# Patient Record
Sex: Female | Born: 1960 | Race: Black or African American | Hispanic: No | Marital: Single | Smoking: Never smoker
Health system: Southern US, Community
[De-identification: ages and names within clinical notes are randomized; demographics above are authoritative.]

## PROBLEM LIST (undated history)

## (undated) DIAGNOSIS — I7300 Raynaud's syndrome without gangrene: Secondary | ICD-10-CM

## (undated) DIAGNOSIS — M5430 Sciatica, unspecified side: Secondary | ICD-10-CM

## (undated) DIAGNOSIS — M1990 Unspecified osteoarthritis, unspecified site: Secondary | ICD-10-CM

## (undated) DIAGNOSIS — F32A Depression, unspecified: Secondary | ICD-10-CM

## (undated) DIAGNOSIS — M810 Age-related osteoporosis without current pathological fracture: Secondary | ICD-10-CM

## (undated) DIAGNOSIS — T7840XA Allergy, unspecified, initial encounter: Secondary | ICD-10-CM

## (undated) DIAGNOSIS — J45909 Unspecified asthma, uncomplicated: Secondary | ICD-10-CM

## (undated) DIAGNOSIS — M797 Fibromyalgia: Secondary | ICD-10-CM

## (undated) DIAGNOSIS — F419 Anxiety disorder, unspecified: Secondary | ICD-10-CM

## (undated) HISTORY — PX: KNEE ARTHROSCOPY: SUR90

## (undated) HISTORY — PX: GASTRIC BYPASS: SHX52

## (undated) HISTORY — DX: Anxiety disorder, unspecified: F41.9

## (undated) HISTORY — DX: Unspecified asthma, uncomplicated: J45.909

## (undated) HISTORY — DX: Fibromyalgia: M79.7

## (undated) HISTORY — DX: Unspecified osteoarthritis, unspecified site: M19.90

## (undated) HISTORY — PX: STOMACH SURGERY: SHX791

## (undated) HISTORY — DX: Allergy, unspecified, initial encounter: T78.40XA

## (undated) HISTORY — PX: ABDOMINAL HYSTERECTOMY: SHX81

## (undated) HISTORY — DX: Age-related osteoporosis without current pathological fracture: M81.0

## (undated) HISTORY — PX: OTHER SURGICAL HISTORY: SHX169

## (undated) HISTORY — DX: Raynaud's syndrome without gangrene: I73.00

## (undated) HISTORY — DX: Sciatica, unspecified side: M54.30

## (undated) HISTORY — DX: Depression, unspecified: F32.A

---

## 2013-09-23 HISTORY — PX: COLONOSCOPY: SHX174

## 2013-09-23 LAB — HM COLONOSCOPY

## 2017-11-17 ENCOUNTER — Other Ambulatory Visit: Payer: Self-pay

## 2017-11-17 ENCOUNTER — Encounter (HOSPITAL_COMMUNITY): Payer: Self-pay | Admitting: Emergency Medicine

## 2017-11-17 ENCOUNTER — Ambulatory Visit (HOSPITAL_COMMUNITY)
Admission: EM | Admit: 2017-11-17 | Discharge: 2017-11-17 | Disposition: A | Discharge status: Home / Self Care | Payer: PRIVATE HEALTH INSURANCE | Attending: Family Medicine | Admitting: Family Medicine

## 2017-11-17 DIAGNOSIS — J40 Bronchitis, not specified as acute or chronic: Secondary | ICD-10-CM

## 2017-11-17 DIAGNOSIS — R05 Cough: Secondary | ICD-10-CM

## 2017-11-17 MED ORDER — METHYLPREDNISOLONE ACETATE 80 MG/ML IJ SUSP
INTRAMUSCULAR | Status: AC
  Filled 2017-11-17: qty 1

## 2017-11-17 MED ORDER — HYDROCODONE-HOMATROPINE 5-1.5 MG/5ML PO SYRP: 5.0000 mL | ORAL_SOLUTION | Freq: Four times a day (QID) | ORAL | 0 refills | Status: DC | PRN

## 2017-11-17 MED ORDER — ALBUTEROL SULFATE HFA 108 (90 BASE) MCG/ACT IN AERS
1.0000 | INHALATION_SPRAY | Freq: Four times a day (QID) | RESPIRATORY_TRACT | Status: DC | PRN
  Administered 2017-11-17: 1 via RESPIRATORY_TRACT

## 2017-11-17 MED ORDER — ALBUTEROL SULFATE HFA 108 (90 BASE) MCG/ACT IN AERS
INHALATION_SPRAY | RESPIRATORY_TRACT | Status: AC
  Filled 2017-11-17: qty 6.7

## 2017-11-17 MED ORDER — METHYLPREDNISOLONE ACETATE 80 MG/ML IJ SUSP
80.0000 mg | Freq: Once | INTRAMUSCULAR | Status: AC
  Administered 2017-11-17: 80 mg via INTRAMUSCULAR

## 2017-11-17 NOTE — ED Provider Notes (Addendum)
  [facility]   [id number] 11/17/17 Arrival Time: 1653   SUBJECTIVE:  Laura Fields is a 57 y.o. female who presents to the urgent care with complaint of cold symptoms x1.5 months. Runny nose, coughing, eye drainage.   Antihistamines work for a few days and then failed to help.  Patient is a non-smoker.  She does not have a history of asthma.  Recently moved from [place].  Currently working for [employer] History reviewed. No pertinent past medical history. No family history on file. Social History   Socioeconomic History  . Marital status: Single    Spouse name: Not on file  . Number of children: Not on file  . Years of education: Not on file  . Highest education level: Not on file  Social Needs  . Financial resource strain: Not on file  . Food insecurity - worry: Not on file  . Food insecurity - inability: Not on file  . Transportation needs - medical: Not on file  . Transportation needs - non-medical: Not on file  Occupational History  . Not on file  Tobacco Use  . Smoking status: Never Smoker  Substance and Sexual Activity  . Alcohol use: Yes  . Drug use: Not on file  . Sexual activity: Not on file  Other Topics Concern  . Not on file  Social History Narrative  . Not on file   No outpatient medications have been marked as taking for the 11/17/17 encounter [facility](Hospital Encounter).   No Known Allergies    ROS: As per HPI, remainder of ROS negative.   OBJECTIVE:   Vitals:   11/17/17 1723  BP: (!) 135/91  Pulse: 60  Resp: 18  Temp: 98.1 F (36.7 C)  SpO2: 97%     General appearance: alert; no distress Eyes: PERRL; EOMI; conjunctiva normal HENT: normocephalic; atraumatic; TMs normal, canal normal, external ears normal without trauma; nasal mucosa normal; oral mucosa normal Neck: supple Lungs: clear to auscultation bilaterally Heart: regular rate and rhythm Back: no CVA tenderness Extremities: no cyanosis or edema; symmetrical with no  gross deformities Skin: warm and dry Neurologic: normal gait; grossly normal Psychological: alert and cooperative; normal mood and affect      Labs:  No results found for this or any previous visit.  Labs Reviewed - No data to display  No results found.     ASSESSMENT & PLAN:  1. Bronchitis     Meds ordered this encounter  Medications  . methylPREDNISolone acetate (DEPO-MEDROL) injection 80 mg  . albuterol (PROVENTIL HFA;VENTOLIN HFA) 108 (90 Base) MCG/ACT inhaler 1-2 puff  . HYDROcodone-homatropine (HYDROMET) 5-1.5 MG/5ML syrup    Sig: Take 5 mLs by mouth every 6 (six) hours as needed for cough.    Dispense:  60 mL    Refill:  0    Reviewed expectations re: course of current medical issues. Questions answered. Outlined signs and symptoms indicating need for more acute intervention. Patient verbalized understanding. After Visit Summary given.      Elvina SidleLauenstein, Terrie Grajales, MD 11/17/17 [place] [id number]    Elvina SidleLauenstein, Zayon Trulson, MD 11/17/17 1757

## 2017-11-17 NOTE — ED Triage Notes (Signed)
Pt c/o cold symptoms x1.5 months. Runny nose, coughing, eye drainage.

## 2018-02-06 ENCOUNTER — Encounter (HOSPITAL_COMMUNITY): Payer: Self-pay | Admitting: Emergency Medicine

## 2018-02-06 ENCOUNTER — Ambulatory Visit (HOSPITAL_COMMUNITY)
Admission: EM | Admit: 2018-02-06 | Discharge: 2018-02-06 | Disposition: A | Discharge status: Home / Self Care | Payer: PRIVATE HEALTH INSURANCE | Attending: Family Medicine | Admitting: Family Medicine

## 2018-02-06 DIAGNOSIS — J40 Bronchitis, not specified as acute or chronic: Secondary | ICD-10-CM | POA: Diagnosis not present

## 2018-02-06 MED ORDER — HYDROCODONE-HOMATROPINE 5-1.5 MG/5ML PO SYRP
5.0000 mL | ORAL_SOLUTION | Freq: Four times a day (QID) | ORAL | 0 refills | Status: DC | PRN
Start: 2018-02-06 — End: 2018-02-13

## 2018-02-06 MED ORDER — METHYLPREDNISOLONE ACETATE 80 MG/ML IJ SUSP
80.0000 mg | Freq: Once | INTRAMUSCULAR | Status: AC
  Administered 2018-02-06: 80 mg via INTRAMUSCULAR

## 2018-02-06 MED ORDER — METHYLPREDNISOLONE ACETATE 80 MG/ML IJ SUSP
INTRAMUSCULAR | Status: AC
  Filled 2018-02-06: qty 1

## 2018-02-06 NOTE — ED Triage Notes (Signed)
Pt sts URI sx x 2 weeks  

## 2018-02-06 NOTE — ED Provider Notes (Signed)
[facility]    CSN: [id number] Arrival date & time: 02/06/18  1701     History   Chief Complaint Chief Complaint  Patient presents with  . URI    HPI Laura Fields is a 57 y.o. female.   Patient awoke this morning with these symptoms.  She has not had any fever.  Cough is productive of white sputum.  She was treated at her last visit here in January with a steroid shot and some cough syrup as well as albuterol inhaler.  This seemed to work well for her.  HPI  History reviewed. No pertinent past medical history.  There are no active problems to display for this patient.   Past Surgical History:  Procedure Laterality Date  . GASTRIC BYPASS      OB History   None      Home Medications    Prior to Admission medications   Medication Sig Start Date End Date Taking? Authorizing Provider  HYDROcodone-homatropine (HYDROMET) 5-1.5 MG/5ML syrup Take 5 mLs by mouth every 6 (six) hours as needed for cough. 02/06/18   Frederica Kuster, MD    Family History History reviewed. No pertinent family history.  Social History Social History   Tobacco Use  . Smoking status: Never Smoker  Substance Use Topics  . Alcohol use: Yes  . Drug use: Not on file     Allergies   Patient has no known allergies.   Review of Systems Review of Systems  Constitutional: Negative.   HENT: Positive for congestion.   Respiratory: Positive for cough.   Cardiovascular: Negative.   Gastrointestinal: Negative.      Physical Exam Triage Vital Signs ED Triage Vitals [02/06/18 1715]  Enc Vitals Group     BP (!) 152/68     Pulse Rate 83     Resp 18     Temp 98.3 F (36.8 C)     Temp Source Oral     SpO2 99 %     Weight      Height      Head Circumference      Peak Flow      Pain Score      Pain Loc      Pain Edu?      Excl. in GC?    No data found.  Updated Vital Signs BP (!) 152/68 (BP Location: Right Arm)   Pulse 83   Temp 98.3 F (36.8 C) (Oral)   Resp 18    SpO2 99%   Visual Acuity Right Eye Distance:   Left Eye Distance:   Bilateral Distance:    Right Eye Near:   Left Eye Near:    Bilateral Near:     Physical Exam  Constitutional: She appears well-developed and well-nourished.  HENT:  Head: Normocephalic.  Mouth/Throat: Oropharynx is clear and moist.  Cardiovascular: Normal rate and regular rhythm.  Pulmonary/Chest: Effort normal. She has wheezes.     UC Treatments / Results  Labs (all labs ordered are listed, but only abnormal results are displayed) Labs Reviewed - No data to display  EKG None Radiology No results found.  Procedures Procedures (including critical care time)  Medications Ordered in UC Medications  methylPREDNISolone acetate (DEPO-MEDROL) injection 80 mg (has no administration in time range)     Initial Impression / Assessment and Plan / UC Course  I have reviewed the triage vital signs and the nursing notes.  Pertinent labs & imaging results that were available during  my care of the patient were reviewed by me and considered in my medical decision making (see chart for details).     Bronchitis, viral versus allergic.  I have asked her to watch for development of fever or darkening of sputum.  For now I believe a repeat of the steroid injection and refill on her cough medicine is the appropriate treatment  Final Clinical Impressions(s) / UC Diagnoses   Final diagnoses:  Bronchitis    ED Discharge Orders        Ordered    HYDROcodone-homatropine (HYDROMET) 5-1.5 MG/5ML syrup  Every 6 hours PRN     02/06/18 1728       Controlled Substance Prescriptions [place] Controlled Substance Registry consulted? Yes, I have consulted the [place] Controlled Substances Registry for this patient, and feel the risk/benefit ratio today is favorable for proceeding with this prescription for a controlled substance.   Frederica KusterMiller, Alley Neils M, MD 02/06/18 [phone]

## 2018-02-07 ENCOUNTER — Telehealth (HOSPITAL_COMMUNITY): Payer: Self-pay | Admitting: Family Medicine

## 2018-02-07 MED ORDER — CETIRIZINE HCL 10 MG PO TABS: 10.0000 mg | ORAL_TABLET | Freq: Every day | ORAL | 0 refills | Status: DC

## 2018-02-13 ENCOUNTER — Other Ambulatory Visit: Payer: Self-pay

## 2018-02-13 ENCOUNTER — Ambulatory Visit (HOSPITAL_COMMUNITY)
Admission: EM | Admit: 2018-02-13 | Discharge: 2018-02-13 | Disposition: A | Discharge status: Home / Self Care | Payer: PRIVATE HEALTH INSURANCE | Attending: Family Medicine | Admitting: Family Medicine

## 2018-02-13 ENCOUNTER — Encounter (HOSPITAL_COMMUNITY): Payer: Self-pay | Admitting: Emergency Medicine

## 2018-02-13 DIAGNOSIS — J40 Bronchitis, not specified as acute or chronic: Secondary | ICD-10-CM | POA: Diagnosis not present

## 2018-02-13 MED ORDER — HYDROCODONE-HOMATROPINE 5-1.5 MG/5ML PO SYRP: 5.0000 mL | ORAL_SOLUTION | Freq: Four times a day (QID) | ORAL | 0 refills | Status: DC | PRN

## 2018-02-13 MED ORDER — AZITHROMYCIN 250 MG PO TABS: 250.0000 mg | ORAL_TABLET | Freq: Every day | ORAL | 0 refills | Status: DC

## 2018-02-13 MED ORDER — BENZONATATE 200 MG PO CAPS: 200.0000 mg | ORAL_CAPSULE | Freq: Three times a day (TID) | ORAL | 0 refills | Status: AC

## 2018-02-13 MED ORDER — FLUTICASONE PROPIONATE 50 MCG/ACT NA SUSP: 1.0000 | Freq: Every day | NASAL | 0 refills | Status: DC

## 2018-02-13 MED ORDER — CETIRIZINE HCL 10 MG PO TABS: 10.0000 mg | ORAL_TABLET | Freq: Every day | ORAL | 0 refills | Status: DC

## 2018-02-13 NOTE — Discharge Instructions (Addendum)
Please continue daily Zyrtec, add an daily Flonase nasal spray.  Please begin azithromycin, 2 tablets today, 1 tablet for the following 4 days.  Use Tessalon for cough during the day or over-the-counter Delsym, Robitussin or Robitussin-DM, at nighttime you may take Hycodan.  Please return if symptoms worsening, changing or not improving.  It is possible that this cough linger for a while.

## 2018-02-13 NOTE — ED Provider Notes (Signed)
[facility]    CSN: [id number] Arrival date & time: 02/13/18  1606     History   Chief Complaint Chief Complaint  Patient presents with  . URI    HPI Laura Fields is a 57 y.o. female no contributing past medical history presenting today for evaluation of cough.  Patient was seen here approximately 1 week ago and treated for bronchitis with albuterol inhaler, Hydromet cough syrup and a Depo-Medrol injection.  Patient is presenting today as she is still had a persistent cough.  She states that is slightly improved, but still persistent and keeping her up at night.  She is out of the cough syrup provided.  She is not taking anything for cough during the day.  She has had some congestion, but denies sore throat.  Denies history of asthma or smoking.  Symptoms in total of been going on for 2-3 weeks.   HPI  History reviewed. No pertinent past medical history.  There are no active problems to display for this patient.   Past Surgical History:  Procedure Laterality Date  . GASTRIC BYPASS      OB History   None      Home Medications    Prior to Admission medications   Medication Sig Start Date End Date Taking? Authorizing Provider  azithromycin (ZITHROMAX) 250 MG tablet Take 1 tablet (250 mg total) by mouth daily. Take first 2 tablets together, then 1 every day until finished. 02/13/18   Shannin Naab C, PA-C  benzonatate (TESSALON) 200 MG capsule Take 1 capsule (200 mg total) by mouth every 8 (eight) hours for 7 days. 02/13/18 02/20/18  Latyra Jaye C, PA-C  cetirizine (ZYRTEC ALLERGY) 10 MG tablet Take 1 tablet (10 mg total) by mouth daily. 02/13/18   Jasneet Schobert C, PA-C  fluticasone (FLONASE) 50 MCG/ACT nasal spray Place 1-2 sprays into both nostrils daily for 7 days. 02/13/18 02/20/18  Zhara Gieske C, PA-C  HYDROcodone-homatropine (HYCODAN) 5-1.5 MG/5ML syrup Take 5 mLs by mouth every 6 (six) hours as needed for cough. 02/13/18   Jonn Chaikin, Junius Creamer, PA-C     Family History Family History  Problem Relation Age of Onset  . Hypertension Mother     Social History Social History   Tobacco Use  . Smoking status: Never Smoker  Substance Use Topics  . Alcohol use: Yes  . Drug use: Not on file     Allergies   Rocephin [ceftriaxone sodium in dextrose]   Review of Systems Review of Systems  Constitutional: Negative for chills, fatigue and fever.  HENT: Positive for congestion. Negative for ear pain, rhinorrhea, sinus pressure, sore throat and trouble swallowing.   Respiratory: Positive for cough. Negative for chest tightness and shortness of breath.   Cardiovascular: Negative for chest pain.  Gastrointestinal: Negative for abdominal pain, nausea and vomiting.  Musculoskeletal: Negative for myalgias.  Skin: Negative for rash.  Neurological: Negative for dizziness, light-headedness and headaches.     Physical Exam Triage Vital Signs ED Triage Vitals  Enc Vitals Group     BP 02/13/18 1633 136/89     Pulse Rate 02/13/18 1633 74     Resp 02/13/18 1633 (!) 22     Temp 02/13/18 1633 97.9 F (36.6 C)     Temp Source 02/13/18 1633 Oral     SpO2 02/13/18 1633 100 %     Weight --      Height --      Head Circumference --  Peak Flow --      Pain Score 02/13/18 1631 8     Pain Loc --      Pain Edu? --      Excl. in GC? --    No data found.  Updated Vital Signs BP 136/89 (BP Location: Left Arm)   Pulse 74   Temp 97.9 F (36.6 C) (Oral)   Resp (!) 22 Comment: coughing  SpO2 100%   Visual Acuity Right Eye Distance:   Left Eye Distance:   Bilateral Distance:    Right Eye Near:   Left Eye Near:    Bilateral Near:     Physical Exam  Constitutional: She appears well-developed and well-nourished. No distress.  HENT:  Head: Normocephalic and atraumatic.  Bilateral TMs nonerythematous, nasal mucosa erythematous without rhinorrhea, posterior oropharynx erythematous without tonsillar enlargement or exudate.  Eyes:  Conjunctivae are normal.  Neck: Neck supple.  Cardiovascular: Normal rate and regular rhythm.  No murmur heard. Pulmonary/Chest: Effort normal and breath sounds normal. No respiratory distress.  Breathing comfortably at rest, frequent coughing in room, CTA BL, no wheezing or other adventitious sounds auscultated.  Abdominal: Soft. There is no tenderness.  Musculoskeletal: She exhibits no edema.  Neurological: She is alert.  Skin: Skin is warm and dry.  Psychiatric: She has a normal mood and affect.  Nursing note and vitals reviewed.    UC Treatments / Results  Labs (all labs ordered are listed, but only abnormal results are displayed) Labs Reviewed - No data to display  EKG None Radiology No results found.  Procedures Procedures (including critical care time)  Medications Ordered in UC Medications - No data to display   Initial Impression / Assessment and Plan / UC Course  I have reviewed the triage vital signs and the nursing notes.  Pertinent labs & imaging results that were available during my care of the patient were reviewed by me and considered in my medical decision making (see chart for details).     Patient likely with bronchitis versus post viral cough that is lingering.  Patient afebrile, no tachycardia, oxygen 100%.  Given length of symptoms and has not been on antibiotic yet will treat with azithromycin.  Refill Hycodan, Tessalon for cough during the day.  Discussed precautions regarding sedation and Hycodan.  Continue Zyrtec, add in Flonase for congestion. Discussed strict return precautions. Patient verbalized understanding and is agreeable with plan.   Final Clinical Impressions(s) / UC Diagnoses   Final diagnoses:  Bronchitis    ED Discharge Orders        Ordered    azithromycin (ZITHROMAX) 250 MG tablet  Daily     02/13/18 1653    HYDROcodone-homatropine (HYCODAN) 5-1.5 MG/5ML syrup  Every 6 hours PRN     02/13/18 1653    fluticasone (FLONASE)  50 MCG/ACT nasal spray  Daily     02/13/18 1653    cetirizine (ZYRTEC ALLERGY) 10 MG tablet  Daily     02/13/18 1653    benzonatate (TESSALON) 200 MG capsule  Every 8 hours     02/13/18 1654       Controlled Substance Prescriptions [place] Controlled Substance Registry consulted? No   Lew DawesWieters, Xylah Early C, [place]PA-C 02/13/18 1738

## 2018-02-13 NOTE — ED Triage Notes (Signed)
Patient reports this is a follow up to previous visit.  Patient says she is slightly better, but not by much.  Frequent coughing during assessment

## 2019-12-25 ENCOUNTER — Encounter: Payer: Self-pay | Admitting: Allergy and Immunology

## 2019-12-25 ENCOUNTER — Other Ambulatory Visit: Payer: Self-pay

## 2019-12-25 ENCOUNTER — Other Ambulatory Visit: Payer: Self-pay | Admitting: Allergy and Immunology

## 2019-12-25 ENCOUNTER — Ambulatory Visit (INDEPENDENT_AMBULATORY_CARE_PROVIDER_SITE_OTHER): Payer: 59 | Admitting: Allergy and Immunology

## 2019-12-25 VITALS — BP 138/84 | HR 68 | Temp 98.1°F | Resp 16 | Ht 64.8 in | Wt 172.6 lb

## 2019-12-25 DIAGNOSIS — H1010 Acute atopic conjunctivitis, unspecified eye: Secondary | ICD-10-CM | POA: Insufficient documentation

## 2019-12-25 DIAGNOSIS — J4520 Mild intermittent asthma, uncomplicated: Secondary | ICD-10-CM | POA: Diagnosis not present

## 2019-12-25 DIAGNOSIS — J3089 Other allergic rhinitis: Secondary | ICD-10-CM | POA: Insufficient documentation

## 2019-12-25 DIAGNOSIS — J0110 Acute frontal sinusitis, unspecified: Secondary | ICD-10-CM | POA: Insufficient documentation

## 2019-12-25 DIAGNOSIS — J45909 Unspecified asthma, uncomplicated: Secondary | ICD-10-CM | POA: Insufficient documentation

## 2019-12-25 DIAGNOSIS — J4540 Moderate persistent asthma, uncomplicated: Secondary | ICD-10-CM | POA: Insufficient documentation

## 2019-12-25 DIAGNOSIS — H1013 Acute atopic conjunctivitis, bilateral: Secondary | ICD-10-CM | POA: Diagnosis not present

## 2019-12-25 MED ORDER — AZELASTINE-FLUTICASONE 137-50 MCG/ACT NA SUSP: NASAL | 5 refills | Status: DC

## 2019-12-25 MED ORDER — ALBUTEROL SULFATE HFA 108 (90 BASE) MCG/ACT IN AERS: 2.0000 | INHALATION_SPRAY | RESPIRATORY_TRACT | 1 refills | Status: DC | PRN

## 2019-12-25 MED ORDER — OLOPATADINE HCL 0.2 % OP SOLN: 1.0000 [drp] | Freq: Every day | OPHTHALMIC | 5 refills | Status: DC | PRN

## 2019-12-25 NOTE — Assessment & Plan Note (Signed)
   Continue albuterol HFA, 1 to 2 inhalations every 4-6 hours if needed.  Subjective and objective measures of pulmonary function will be followed and the treatment plan will be adjusted accordingly. 

## 2019-12-25 NOTE — Progress Notes (Signed)
New Patient Note  RE: Laura Fields MRN: [id number] DOB: [birth year] Date of Office Visit: 12/25/2019  Referring provider: No ref. provider found Primary care provider: Patient, No Pcp Per  Chief Complaint: Sinus Problem and Allergic Rhinitis    History of present illness: Laura Fields is a 59 y.o. female presenting today for evaluation of rhinosinusitis and asthma.  She complains of frontal sinus pressure, nasal congestion, rhinorrhea, thick postnasal drainage, throat irritation, coughing, sneezing, nasal pruritus, and ocular pruritus.  She denies fevers, chills, and discolored mucus production.  These symptoms occur year-round but are more frequent and severe during the springtime.  She has tried loratadine, cetirizine, and fluticasone nasal spray without adequate symptom relief.  She reports that approximately 2 years ago she had bronchitis and was prescribed albuterol with benefit.  At times she still will experience coughing, wheezing, and chest tightness and therefore has access to albuterol HFA.  Assessment and plan: Seasonal allergic rhinitis with a nonallergic component  Aeroallergen avoidance measures have been discussed and provided in written form.  A prescription has been provided for azelastine/fluticasone nasal spray, 1 spray per nostril twice daily as needed. Proper nasal spray technique has been discussed and demonstrated.  Nasal saline lavage (NeilMed) has been recommended as needed and prior to medicated nasal sprays along with instructions for proper administration.  If allergen avoidance measures and medications fail to adequately relieve symptoms, aeroallergen immunotherapy will be considered.  Acute frontal sinusitis  Treatment plan as outlined above for allergic rhinitis.  Prednisone has been provided, 40 mg x3 days, 20 mg x1 day, 10 mg x1 day, then stop.  For thick post nasal drainage, nasal congestion, and/or sinus pressure, add guaifenesin [phone] mg  (Mucinex) plus/minus pseudoephedrine 60-120 mg  twice daily as needed with adequate hydration as discussed. Pseudoephedrine is only to be used for short-term relief of nasal/sinus congestion. Long-term use is discouraged due to potential side effects.  The patient has been asked to contact us if she develops fevers, chills, or discolored mucus production.  Allergic conjunctivitis  Treatment plan as outlined above for allergic rhinitis.  A prescription has been provided for Pataday, one drop per eye daily as needed.  I have also recommended eye lubricant drops (i.e., Natural Tears) as needed.  Asthmatic bronchitis  Continue albuterol HFA, 1 to 2 inhalations every 4-6 hours if needed.  Subjective and objective measures of pulmonary function will be followed and the treatment plan will be adjusted accordingly.   Meds ordered this encounter  Medications  . Azelastine-Fluticasone (DYMISTA) 137-50 MCG/ACT SUSP    Sig: 1 spray per nostril twice daily as needed    Dispense:  23 g    Refill:  5  . Olopatadine HCl (PATADAY) 0.2 % SOLN    Sig: Place 1 drop into both eyes daily as needed.    Dispense:  2.5 mL    Refill:  5  . albuterol (VENTOLIN HFA) 108 (90 Base) MCG/ACT inhaler    Sig: Inhale 2 puffs into the lungs every 4 (four) hours as needed for wheezing or shortness of breath.    Dispense:  18 g    Refill:  1    Diagnostics: Spirometry: FVC was 2.31 L and FEV1 was 2.16 L (96% predicted) without postbronchodilator improvement.  This study was performed while the patient was asymptomatic.  Please see scanned spirometry results for details. Epicutaneous testing: Positive to grass pollen, ragweed pollen, and weed pollen. Intradermal testing: Negative despite a positive histamine control.  Physical  examination: Blood pressure 138/84, pulse 68, temperature 98.1 F (36.7 C), temperature source Oral, resp. rate 16, height 5' 4.8" (1.646 m), weight 172 lb 9.6 oz (78.3 kg), SpO2 100  %.  General: Alert, interactive, in no acute distress. HEENT: TMs pearly gray, turbinates moderately edematous without discharge, post-pharynx erythematous. Neck: Supple without lymphadenopathy. Lungs: Clear to auscultation without wheezing, rhonchi or rales. CV: Normal S1, S2 without murmurs. Abdomen: Nondistended, nontender. Skin: Warm and dry, without lesions or rashes. Extremities:  No clubbing, cyanosis or edema. Neuro:   Grossly intact.  Review of systems:  Review of systems negative except as noted in HPI / PMHx or noted below: Review of Systems  Constitutional: Negative.   HENT: Negative.   Eyes: Negative.   Respiratory: Negative.   Cardiovascular: Negative.   Gastrointestinal: Negative.   Genitourinary: Negative.   Musculoskeletal: Negative.   Skin: Negative.   Neurological: Negative.   Endo/Heme/Allergies: Negative.   Psychiatric/Behavioral: Negative.     Past medical history:  Past Medical History:  Diagnosis Date  . Asthma     Past surgical history:  Past Surgical History:  Procedure Laterality Date  . ABDOMINAL HYSTERECTOMY    . back nerve     block  . GASTRIC BYPASS    . KNEE ARTHROSCOPY    . mass removable     on thumb  . STOMACH SURGERY      Family history: Family History  Problem Relation Age of Onset  . Hypertension Mother   . Allergic rhinitis Neg Hx   . Angioedema Neg Hx   . Asthma Neg Hx   . Eczema Neg Hx   . Immunodeficiency Neg Hx   . Urticaria Neg Hx     Social history: Social History   Socioeconomic History  . Marital status: Single    Spouse name: Not on file  . Number of children: Not on file  . Years of education: Not on file  . Highest education level: Not on file  Occupational History  . Not on file  Tobacco Use  . Smoking status: Never Smoker  . Smokeless tobacco: Never Used  Substance and Sexual Activity  . Alcohol use: Yes  . Drug use: Never  . Sexual activity: Not on file  Other Topics Concern  . Not on  file  Social History Narrative  . Not on file   Social Determinants of Health   Financial Resource Strain:   . Difficulty of Paying Living Expenses: Not on file  Food Insecurity:   . Worried About Programme researcher, broadcasting/film/video in the Last Year: Not on file  . Ran Out of Food in the Last Year: Not on file  Transportation Needs:   . Lack of Transportation (Medical): Not on file  . Lack of Transportation (Non-Medical): Not on file  Physical Activity:   . Days of Exercise per Week: Not on file  . Minutes of Exercise per Session: Not on file  Stress:   . Feeling of Stress : Not on file  Social Connections:   . Frequency of Communication with Friends and Family: Not on file  . Frequency of Social Gatherings with Friends and Family: Not on file  . Attends Religious Services: Not on file  . Active Member of Clubs or Organizations: Not on file  . Attends Banker Meetings: Not on file  . Marital Status: Not on file  Intimate Partner Violence:   . Fear of Current or Ex-Partner: Not on file  . Emotionally  Abused: Not on file  . Physically Abused: Not on file  . Sexually Abused: Not on file    Environmental History: The patient lives in a 58 year old house with hardwood floors throughout and heat pump HVAC system.  There is a dog in the home which has access to her bedroom.  There is no known mold/water damage in the home.  She is a non-smoker.  Current Outpatient Medications  Medication Sig Dispense Refill  . albuterol (VENTOLIN HFA) 108 (90 Base) MCG/ACT inhaler Inhale 2 puffs into the lungs every 6 (six) hours as needed for wheezing or shortness of breath.    . baclofen (LIORESAL) 10 MG tablet Take 10 mg by mouth 3 (three) times daily.    . cyclobenzaprine (FLEXERIL) 5 MG tablet Take 5 mg by mouth 3 (three) times daily as needed for muscle spasms.    Marland Kitchen ibuprofen (ADVIL) 800 MG tablet Take 800 mg by mouth every 8 (eight) hours as needed.    . meloxicam (MOBIC) 15 MG tablet Take 15 mg  by mouth daily.    . pregabalin (LYRICA) 150 MG capsule Take 150 mg by mouth 2 (two) times daily.    . traMADol (ULTRAM) 50 MG tablet Take 50 mg by mouth every 6 (six) hours as needed.    Marland Kitchen albuterol (VENTOLIN HFA) 108 (90 Base) MCG/ACT inhaler Inhale 2 puffs into the lungs every 4 (four) hours as needed for wheezing or shortness of breath. 18 g 1  . Azelastine-Fluticasone (DYMISTA) 137-50 MCG/ACT SUSP 1 spray per nostril twice daily as needed 23 g 5  . fluticasone (FLONASE) 50 MCG/ACT nasal spray Place 1-2 sprays into both nostrils daily for 7 days. 1 g 0  . Olopatadine HCl (PATADAY) 0.2 % SOLN Place 1 drop into both eyes daily as needed. 2.5 mL 5   No current facility-administered medications for this visit.    Known medication allergies: Allergies  Allergen Reactions  . Rocephin [Ceftriaxone Sodium In Dextrose] Hives    I appreciate the opportunity to take part in [place] care. Please do not hesitate to contact me with questions.  Sincerely,   R. Edgar Frisk, MD

## 2019-12-25 NOTE — Assessment & Plan Note (Signed)
   Treatment plan as outlined above for allergic rhinitis.  A prescription has been provided for Pataday, one drop per eye daily as needed.  I have also recommended eye lubricant drops (i.e., Natural Tears) as needed. 

## 2019-12-25 NOTE — Assessment & Plan Note (Addendum)
   Treatment plan as outlined above for allergic rhinitis.  Prednisone has been provided, 40 mg x3 days, 20 mg x1 day, 10 mg x1 day, then stop.  For thick post nasal drainage, nasal congestion, and/or sinus pressure, add guaifenesin [phone] mg (Mucinex) plus/minus pseudoephedrine 60-120 mg  twice daily as needed with adequate hydration as discussed. Pseudoephedrine is only to be used for short-term relief of nasal/sinus congestion. Long-term use is discouraged due to potential side effects.  The patient has been asked to contact us if she develops fevers, chills, or discolored mucus production.

## 2019-12-25 NOTE — Patient Instructions (Addendum)
Seasonal allergic rhinitis with a nonallergic component  Aeroallergen avoidance measures have been discussed and provided in written form.  A prescription has been provided for azelastine/fluticasone nasal spray, 1 spray per nostril twice daily as needed. Proper nasal spray technique has been discussed and demonstrated.  Nasal saline lavage (NeilMed) has been recommended as needed and prior to medicated nasal sprays along with instructions for proper administration.  If allergen avoidance measures and medications fail to adequately relieve symptoms, aeroallergen immunotherapy will be considered.  Acute frontal sinusitis  Treatment plan as outlined above for allergic rhinitis.  Prednisone has been provided, 40 mg x3 days, 20 mg x1 day, 10 mg x1 day, then stop.  For thick post nasal drainage, nasal congestion, and/or sinus pressure, add guaifenesin [phone] mg (Mucinex) plus/minus pseudoephedrine 60-120 mg  twice daily as needed with adequate hydration as discussed. Pseudoephedrine is only to be used for short-term relief of nasal/sinus congestion. Long-term use is discouraged due to potential side effects.  The patient has been asked to contact us if she develops fevers, chills, or discolored mucus production.  Allergic conjunctivitis  Treatment plan as outlined above for allergic rhinitis.  A prescription has been provided for Pataday, one drop per eye daily as needed.  I have also recommended eye lubricant drops (i.e., Natural Tears) as needed.  Asthmatic bronchitis  Continue albuterol HFA, 1 to 2 inhalations every 4-6 hours if needed.  Subjective and objective measures of pulmonary function will be followed and the treatment plan will be adjusted accordingly.   Return in about 4 months (around 04/25/2020), or if symptoms worsen or fail to improve.  Reducing Pollen Exposure  The American Academy of Allergy, Asthma and Immunology suggests the following steps to reduce your  exposure to pollen during allergy seasons.    1. Do not hang sheets or clothing out to dry; pollen may collect on these items. 2. Do not mow lawns or spend time around freshly cut grass; mowing stirs up pollen. 3. Keep windows closed at night.  Keep car windows closed while driving. 4. Minimize morning activities outdoors, a time when pollen counts are usually at their highest. 5. Stay indoors as much as possible when pollen counts or humidity is high and on windy days when pollen tends to remain in the air longer. 6. Use air conditioning when possible.  Many air conditioners have filters that trap the pollen spores. 7. Use a HEPA room air filter to remove pollen form the indoor air you breathe.

## 2019-12-25 NOTE — Assessment & Plan Note (Addendum)
   Aeroallergen avoidance measures have been discussed and provided in written form.  A prescription has been provided for azelastine/fluticasone nasal spray, 1 spray per nostril twice daily as needed. Proper nasal spray technique has been discussed and demonstrated.  Nasal saline lavage (NeilMed) has been recommended as needed and prior to medicated nasal sprays along with instructions for proper administration.  If allergen avoidance measures and medications fail to adequately relieve symptoms, aeroallergen immunotherapy will be considered.

## 2019-12-26 ENCOUNTER — Telehealth: Payer: Self-pay | Admitting: Allergy and Immunology

## 2019-12-26 MED ORDER — FLUTICASONE PROPIONATE 50 MCG/ACT NA SUSP: 2.0000 | Freq: Every day | NASAL | 5 refills | Status: DC

## 2019-12-26 NOTE — Telephone Encounter (Signed)
PT called says hepa air purifier and filters are covered by insurance we would have to fill out a medical necessity form w/ her insurance [facility] ph [phone]. PT also says the eye drops cost $50 and looking for cheaper alternative.

## 2019-12-26 NOTE — Telephone Encounter (Signed)
Patient will have them to send form to [place]. Did not see any other eye drops that would be cheaper on [email or web address]. patient stated, if we didn't it would be ok she would still pay it.

## 2019-12-27 NOTE — Telephone Encounter (Signed)
Spoke with rep.  This reference number #[id number]  is in regard to olopatadine rx.  Per rep at [employer] (division of [facility]) this information has already been received by a fax and no additional information is needed from Dr Nunzio Cobbs.  They are waiting on a review from their pharmacist as to approval. No additional information is needed and [facility] will contact us if needed.

## 2019-12-27 NOTE — Telephone Encounter (Signed)
[facility] called and had some questions on Dymista prescription. Directed that we call [facility] at [phone] with reference #:[id number].  Please advise.

## 2019-12-27 NOTE — Telephone Encounter (Signed)
Pt called and is requesting that I go to [email or web address] and submit a form to get a HEPA filter approved through her insurance. She provided me with the number [phone]. I explained to her that we usually will provide the pt with a written prescription and a letter of medical necessity and the pt is responsible for the rest. I told her to have her insurance fax  [place] the form but she stated that they would not do that. Therefore I do not have any idea as to what form they need.   Johnette please advise on the proper way to handle this.

## 2019-12-27 NOTE — Telephone Encounter (Signed)
Called pts insurance and had them direct me to the correct pre auth form online. This has been filled out. Dr. Nunzio Cobbs is not working today- Monday he will need to sign a prescription for the HEPA filter to fax with the form.

## 2019-12-27 NOTE — Telephone Encounter (Signed)
We received a coverage determination request form from [facility] for olopatadine 0.2% and Dymista nasal spray- both signed by Dr. Nunzio Cobbs and faxed back. Scanned into chart.

## 2019-12-30 NOTE — Telephone Encounter (Signed)
PA denied for both olopatadine 0.2% & Dymista.   Alternatives for olopatadine 0.2% are: Azelastine hcl 0.05%, cromolyn solution 4%, epinastine solution 0.05%.   Alternatives for Dymista: azelastine nasal solution, olopatadine nasal solution, ipratropium nasal solution, budesonide, flunisolide, fluticasone, triamcinolone.    Please advise on alternative choice and instructions for both. Thank you.

## 2019-12-30 NOTE — Telephone Encounter (Signed)
Epinastine eyedrops, 1 drop per eye twice daily as needed. Flunisolide nasal spray, 2 sprays per nostril daily as needed. Thanks

## 2019-12-31 ENCOUNTER — Other Ambulatory Visit: Payer: Self-pay | Admitting: *Deleted

## 2019-12-31 ENCOUNTER — Encounter: Payer: Self-pay | Admitting: *Deleted

## 2019-12-31 MED ORDER — FLUNISOLIDE 25 MCG/ACT (0.025%) NA SOLN: 2.0000 | Freq: Every day | NASAL | 5 refills | Status: DC | PRN

## 2019-12-31 MED ORDER — EPINASTINE HCL 0.05 % OP SOLN: 1.0000 [drp] | Freq: Two times a day (BID) | OPHTHALMIC | 5 refills | Status: DC | PRN

## 2019-12-31 NOTE — Telephone Encounter (Signed)
New prescriptions have been sent in. Called patient and left a detailed voicemail per DPR permission advising of change in medications.

## 2020-01-01 NOTE — Telephone Encounter (Signed)
PT called in today to check on status of HEPA machine and filer. Advise per Logan's last note, Dr Nunzio Cobbs signed and submitted PA on Monday.

## 2020-01-09 ENCOUNTER — Telehealth: Payer: Self-pay | Admitting: Allergy and Immunology

## 2020-01-09 NOTE — Telephone Encounter (Signed)
PT called to check on status of insurance authorization for covering HEPA filter and machine. PT can be reached at [phone]

## 2020-01-10 NOTE — Telephone Encounter (Signed)
Dr. Nunzio Cobbs please advise if you would like patient to have an air purifier machine? I submitted to her insurance the appropriate PA form, Rx, and letter of medical necessity for a HEPA air filter but she is stating she would like a machine.

## 2020-01-13 NOTE — Telephone Encounter (Signed)
I received a fax back from [facility] health stating that no PA is required for that service for the (FLTR NON DISPBL POS ARWAY PRSS [facility]) service types: [phone]

## 2020-01-13 NOTE — Telephone Encounter (Signed)
Yes, a HEPA filter typically comes in a machine. We recommend them. Thanks.

## 2020-01-17 ENCOUNTER — Telehealth: Payer: Self-pay

## 2020-01-17 NOTE — Telephone Encounter (Signed)
Patient was told to call office back if sxs persist. She states, she is still coughing and wheezing. Offered appointment but only wanted me to send message to you.Taking all medication as prescribed. Please advise.

## 2020-01-21 ENCOUNTER — Telehealth: Payer: Self-pay | Admitting: *Deleted

## 2020-01-21 ENCOUNTER — Telehealth: Payer: Self-pay | Admitting: Allergy and Immunology

## 2020-01-21 MED ORDER — PREDNISONE 10 MG PO TABS: ORAL_TABLET | ORAL | 0 refills | Status: DC

## 2020-01-21 MED ORDER — BUDESONIDE-FORMOTEROL FUMARATE 160-4.5 MCG/ACT IN AERO: 2.0000 | INHALATION_SPRAY | Freq: Two times a day (BID) | RESPIRATORY_TRACT | 5 refills | Status: DC

## 2020-01-21 NOTE — Telephone Encounter (Signed)
Called patient for an update. Patient states that she finished her prednisone on Sunday, March 7th. She is still having issues with coughing,fatigue,congestion and feeling like her chest is heavy. Sometimes shortness of breath-especially in the morning. Patient states that she felt it was getting better while on the prednisone. Once she stopped, it got worse again. Patient is using all medications as directed with no relief. Patient is a little upset because she states she's had no updates and has been calling since the 4th. Patient also wants an update on the HEPA filter. I spoke with Laura Fields who says that we received a letter from [employer] stating that no PA is required. I relayed this information to the patient. Laura Fields is going to call her insurance to check on the HEPA. She also mentioned something about wanting [place] to prescribe a nasal lavage machine. I told her that we generally recommend the [employer] Med nasal saline washes that you can buy over the counter. She is requesting that I e-mail her a picture and the name of the saline wash to her email [email or web address]. I will take care of that.  Laura Fields is wondering what else she can try to help her symptoms. She is not interested in seeing any other providers other than Dr.Bobbitt because she feels like he knows her situation well and she does not want to start all over with another provider. Patient is okay with a nurse calling her back with information from Dr.Bobbitt on what can help.   Please advice.

## 2020-01-21 NOTE — Telephone Encounter (Signed)
-----   Message from Cristal Ford, MD sent at 01/21/2020  9:54 AM EDT ----- I just tried to call her. I left a message telling her to call 911 if medical emergency or call us back otherwise. Please try to call her back to check in on her. Thanks.

## 2020-01-21 NOTE — Telephone Encounter (Signed)
Bobbitt, Heywood Iles, MD  Virl Son, CMA; [facility]  I just tried to call her. I left a message telling her to call 911 if medical emergency or call us back otherwise. Please try to call her back to check in on her. Thanks.

## 2020-01-21 NOTE — Telephone Encounter (Signed)
Laura Fields health [phone] called with the pt on [address] to get the code for the hepa filter machine. Advise of logans note from 3/22 "(FLTR NON DISPBL POS ARWAY PRSS [facility]) service types: A7039"

## 2020-01-21 NOTE — Telephone Encounter (Signed)
Yes, I did tell her you were out of the office til next week. She didn't want an appointment with Dr. Selena Batten on Friday, she ONLY wanted me to send this message to you for advice.

## 2020-01-21 NOTE — Telephone Encounter (Signed)
Patient called in stating this is her 3rd time and she is requesting that Dr. Rod Can contact her directly per her previous concern.  Please advise.

## 2020-01-21 NOTE — Telephone Encounter (Signed)
Please call in a prescription for prednisone, 20 mg x 4 days, 10 mg x1 day, then stop. Please provide a prescription for Symbicort (budesonide/formoterol) 160/4.5 g, 2 inhalations twice a day.  Please also provide a prescription for a spacer device and explained to her how she is to use the spacer device with HFA inhalers. Thanks.

## 2020-01-21 NOTE — Telephone Encounter (Signed)
I am calling her now. However, did no one tell her that I've been out of town since last Thursday? Why was this not addressed by someone earlier?

## 2020-01-21 NOTE — Telephone Encounter (Signed)
Spoke with pt and informed her that prednisone and Symbicort was [facility] to [facility] [address]. Pt will come by the clinic tomorrow and we will have her sign for a spacer device and show her how to use it.

## 2020-01-22 NOTE — Telephone Encounter (Signed)
Patient stopped by office to pick up spacer. I had her fill out the necessary paperwork and demonstrated how to use it. Patient voiced understanding. She says she is feeling a little better today.

## 2020-01-22 NOTE — Telephone Encounter (Signed)
Noted. Thanks.

## 2020-01-28 ENCOUNTER — Telehealth: Payer: Self-pay | Admitting: Allergy and Immunology

## 2020-01-28 NOTE — Telephone Encounter (Signed)
Lauren from [employer] is requesting a fax to [facility] for a prescription and any notes beneficial for filter. Fax # is [phone]. Asked that fax be marked urgent.

## 2020-01-28 NOTE — Telephone Encounter (Signed)
Papers have been faxed to [facility]

## 2020-01-29 NOTE — Telephone Encounter (Signed)
Called [facility] but Leotis Shames was gone for the day- a message was relayed to her and she will call me back in the morning. I also spoke with pt and she says that the distributor is [employer] and they were trying to dispense a CPAP which is incorrect, pt needs an air purifier. We need to get the correct code for the purifier. Lauren was speaking to Denny Peon that works at [employer]. Lucienne Capers number is [phone].

## 2020-01-29 NOTE — Telephone Encounter (Signed)
Patient calling regarding her device and has been speaking with insurance.  She is requesting to speak with a nurse regarding this.

## 2020-01-30 NOTE — Telephone Encounter (Signed)
I spoke with the manager of [facility] and they do not supply air purifier machines, they only supply CPAPs and other respiratory items. She said that it would probably need to be billed through a [facility]. Also there is not an Denny Peon that works at [employer]

## 2020-01-30 NOTE — Telephone Encounter (Signed)
Tried calling Laura Fields but she left early for the day. Laura Fields is a part of the care navigation department so I spoke with a different rep in that dept. The care navigation dept number is [phone] and she sent Laura Fields a message to let her know that I called. I told her that we need the correct code for the air purifier. She suggested I get a manufacter number or material number for machine and I do not have that. Laura Fields had spoke with Laura Fields last on the 6th.   Called Laura Fields and I gave her an update of where I'm at. I told her I could leave a copy of the the Rx and letter of medical necessity at the front desk but she stated she was not going to pick that up.

## 2020-02-03 NOTE — Telephone Encounter (Signed)
Called the [facility] care navigation department again for the third time. Lauren was unavailable because she was on another line. I was redirected to provider services I spoke with dorothy, she was checking the pts benefit plan to see if a HEPA air purifier machine would be covered. She informed me that this equipment is not covered through her insurance. Reference # for this call is [id number].    I called pt to let her know. [facility] and bought one for $94.

## 2020-02-10 ENCOUNTER — Other Ambulatory Visit: Payer: Self-pay | Admitting: Allergy and Immunology

## 2020-02-12 ENCOUNTER — Ambulatory Visit (INDEPENDENT_AMBULATORY_CARE_PROVIDER_SITE_OTHER): Payer: 59 | Admitting: Allergy

## 2020-02-12 ENCOUNTER — Other Ambulatory Visit: Payer: Self-pay

## 2020-02-12 ENCOUNTER — Encounter: Payer: Self-pay | Admitting: Allergy

## 2020-02-12 VITALS — BP 136/78 | HR 71 | Temp 97.3°F | Resp 20

## 2020-02-12 DIAGNOSIS — H1013 Acute atopic conjunctivitis, bilateral: Secondary | ICD-10-CM | POA: Diagnosis not present

## 2020-02-12 DIAGNOSIS — L282 Other prurigo: Secondary | ICD-10-CM | POA: Diagnosis not present

## 2020-02-12 DIAGNOSIS — J3089 Other allergic rhinitis: Secondary | ICD-10-CM | POA: Diagnosis not present

## 2020-02-12 DIAGNOSIS — J4541 Moderate persistent asthma with (acute) exacerbation: Secondary | ICD-10-CM | POA: Diagnosis not present

## 2020-02-12 MED ORDER — HYDROXYZINE HCL 25 MG PO TABS: 25.0000 mg | ORAL_TABLET | Freq: Three times a day (TID) | ORAL | 0 refills | Status: AC | PRN

## 2020-02-12 MED ORDER — DESONIDE 0.05 % EX OINT: 1.0000 "application " | TOPICAL_OINTMENT | Freq: Two times a day (BID) | CUTANEOUS | 0 refills | Status: DC | PRN

## 2020-02-12 NOTE — Assessment & Plan Note (Signed)
Better with 2 courses of prednisone but declines additional doses as it caused increase in appetite. Started on Symbicort 160 2 puffs twice a day recently which seems to be helping but still using albuterol 3-4 times a day due to coughing. . Did not start any additional medications for asthma today as she recently just started on Symbicort.  . Daily controller medication(s): continue Symbicort 160 2 puffs twice a day with spacer and rinse mouth afterwards. . May use albuterol rescue inhaler 2 puffs or nebulizer every 4 to 6 hours as needed for shortness of breath, chest tightness, coughing, and wheezing. May use albuterol rescue inhaler 2 puffs 5 to 15 minutes prior to strenuous physical activities. Monitor frequency of use.  . Get spirometry at next visit.

## 2020-02-12 NOTE — Patient Instructions (Addendum)
Rash:  May take hydroxyzine 25mg  every 8 hours as needed for itching. This may make you drowsy.  Use desonide ointment twice a day as needed on the rash on the neck.  See below for proper skin care.   If rash does not improve then let know.    If you notice issues with leg swelling or sudden shortness of breath please go to ER/urgent care for further evaluation.   Asthma: . Daily controller medication(s): continue Symbicort 160 2 puffs twice a day with spacer and rinse mouth afterwards. . May use albuterol rescue inhaler 2 puffs or nebulizer every 4 to 6 hours as needed for shortness of breath, chest tightness, coughing, and wheezing. May use albuterol rescue inhaler 2 puffs 5 to 15 minutes prior to strenuous physical activities. Monitor frequency of use.  . Asthma control goals:  o Full participation in all desired activities (may need albuterol before activity) o Albuterol use two times or less a week on average (not counting use with activity) o Cough interfering with sleep two times or less a month o Oral steroids no more than once a year o No hospitalizations  Environmental allergies  Continue environmental control measures.   Epinastine eyedrops, 1 drop per eye twice daily as needed for itchy/watery eyes.  Flunisolide nasal spray, 2 sprays per nostril daily as needed.  Nasal saline lavage (NeilMed) has been recommended as needed and prior to medicated nasal sprays along with instructions for proper administration.  Follow up with DR. Bobbitt as scheduled.  Skin care recommendations  Bath time: . Always use lukewarm water. AVOID very hot or cold water. [place] Keep bathing time to 5-10 minutes. . Do NOT use bubble bath. . Use a mild soap and use just enough to wash the dirty areas. . Do NOT scrub skin vigorously.  . After bathing, pat dry your skin with a towel. Do NOT rub or scrub the skin.  Moisturizers and prescriptions:  . ALWAYS apply moisturizers immediately after  bathing (within 3 minutes). This helps to lock-in moisture. . Use the moisturizer several times a day over the whole body. Marland Kitchen summer moisturizers include: Aveeno, CeraVe, Cetaphil. [place]i [place] winter moisturizers include: Aquaphor, Vaseline, Cerave, Cetaphil, Eucerin, Vanicream. . When using moisturizers along with medications, the moisturizer should be applied about one hour after applying the medication to prevent diluting effect of the medication or moisturize around where you applied the medications. When not using medications, the moisturizer can be continued twice daily as maintenance.  Laundry and clothing: . Avoid laundry products with added color or perfumes. . Use unscented hypo-allergenic laundry products such as Tide free, Cheer free & gentle, and All free and clear.  . If the skin still seems dry or sensitive, you can try double-rinsing the clothes. . Avoid tight or scratchy clothing such as wool. . Do not use fabric softeners or dyer sheets.

## 2020-02-12 NOTE — Assessment & Plan Note (Signed)
Still complaining of some sneezing and rhinorrhea. Does not take OTC antihistamines as they do not work per patient report.   Continue environmental control measures.   Epinastine eyedrops, 1 drop per eye twice daily as needed for itchy/watery eyes.  Flunisolide nasal spray, 2 sprays per nostril daily as needed.  Nasal saline lavage (NeilMed) has been recommended as needed and prior to medicated nasal sprays along with instructions for proper administration.

## 2020-02-12 NOTE — Assessment & Plan Note (Signed)
Presents for 1 day history of pruritic rash on the neck. Believes it's triggered by pollen exposure. She had J&J vaccine on 01/31/20. She did not take any OTC medications for this as she states OTC medications do not work for her. She also does not want to take any prednisone as they increase her appetite.  Not sure what the trigger for her pruritic rash is.   May take hydroxyzine 25mg  every 8 hours as needed for itching. This may cause drowsiness.   Use desonide ointment twice a day as needed on the rash on the neck.  Gave hand out on proper skin care.   If rash does not improve then let know.

## 2020-02-12 NOTE — Progress Notes (Signed)
Follow Up Note  RE: Laura Fields MRN: [id number] DOB: 1961/04/19 Date of Office Visit: 02/12/2020  Referring provider: No ref. provider found Primary care provider: Patient, No Pcp Per  Chief Complaint: Allergic Reaction (ithching and rash all over)  History of Present Illness: I had the pleasure of seeing Laura Fields for a follow up visit at the [facility] on 02/12/2020. She is a 59 y.o. female, who is being followed for allergic rhino conjunctivitis, asthma. Her previous allergy office visit was on 12/25/19 with Dr. Nunzio Cobbs. Today is a new complaint visit of rash and allergic reaction.  Rash: Rash started about 1 day ago.  The rash mainly occurs on her neck. Describes them as itchy, red, little bumps. Associated symptoms include: none. Suspected triggers are pollen. Denies any fevers, foods, personal care products or recent infections. She has tried the following therapies: none. She states over the counter medications do not work for her.  Previous history of rash/hives: denies.  Allergic rhinitis: Still having some sneezing, rhinorrhea. Currently takes flunisolide 2 sprays daily, Epinastine as needed.  Not taking any OTC antihistamines as they do not work for her.   Asthma: Much improved with prednisone but does not want anymore prednisone as it made her very hungry.  Using albuterol 3-4 times a day with some benefit for coughing. Currently on Symbicort 160 2 puffs twice a day with some benefit.   Patient had her J&J vaccine on 01/31/20 and had some calf cramps 1-2 times a day but no swelling associated with this. She has issues with cramps and nerve pain in her legs prior to the vaccine.   Assessment and Plan: Laura Fields is a 59 y.o. female with: Pruritic rash Presents for 1 day history of pruritic rash on the neck. Believes it's triggered by pollen exposure. She had J&J vaccine on 01/31/20. She did not take any OTC medications for this as she states OTC  medications do not work for her. She also does not want to take any prednisone as they increase her appetite.  Not sure what the trigger for her pruritic rash is.   May take hydroxyzine 25mg  every 8 hours as needed for itching. This may cause drowsiness.   Use desonide ointment twice a day as needed on the rash on the neck.  Gave hand out on proper skin care.   If rash does not improve then let know.   Asthma, not well controlled Better with 2 courses of prednisone but declines additional doses as it caused increase in appetite. Started on Symbicort 160 2 puffs twice a day recently which seems to be helping but still using albuterol 3-4 times a day due to coughing. . Did not start any additional medications for asthma today as she recently just started on Symbicort.  . Daily controller medication(s): continue Symbicort 160 2 puffs twice a day with spacer and rinse mouth afterwards. . May use albuterol rescue inhaler 2 puffs or nebulizer every 4 to 6 hours as needed for shortness of breath, chest tightness, coughing, and wheezing. May use albuterol rescue inhaler 2 puffs 5 to 15 minutes prior to strenuous physical activities. Monitor frequency of use.  . Get spirometry at next visit.   Seasonal allergic rhinitis with a nonallergic component Still complaining of some sneezing and rhinorrhea. Does not take OTC antihistamines as they do not work per patient report.   Continue environmental control measures.   Epinastine eyedrops, 1 drop per eye twice daily as  needed for itchy/watery eyes.  Flunisolide nasal spray, 2 sprays per nostril daily as needed.  Nasal saline lavage (NeilMed) has been recommended as needed and prior to medicated nasal sprays along with instructions for proper administration.  Return in about 2 months (around 04/13/2020) for with Dr. Nunzio Cobbs.  Advised patient that if she notes leg swelling or sudden shortness of breath please go to ER/urgent care for further  evaluation.   Meds ordered this encounter  Medications  . hydrOXYzine (ATARAX/VISTARIL) 25 MG tablet    Sig: Take 1 tablet (25 mg total) by mouth 3 (three) times daily as needed for itching.    Dispense:  60 tablet    Refill:  0  . desonide (DESOWEN) 0.05 % ointment    Sig: Apply 1 application topically 2 (two) times daily as needed. Rash on neck    Dispense:  60 g    Refill:  0   Diagnostics: None.  Medication List:  Current Outpatient Medications  Medication Sig Dispense Refill  . albuterol (VENTOLIN HFA) 108 (90 Base) MCG/ACT inhaler Inhale 2 puffs into the lungs every 6 (six) hours as needed for wheezing or shortness of breath.    Marland Kitchen albuterol (VENTOLIN HFA) 108 (90 Base) MCG/ACT inhaler INHALE 2 PUFFS INTO THE LUNGS EVERY 4 (FOUR) HOURS AS NEEDED FOR WHEEZING OR SHORTNESS OF BREATH. 18 g 0  . azelastine (ASTELIN) 0.1 % nasal spray Place 2 sprays into both nostrils 2 (two) times daily. 30 mL 5  . baclofen (LIORESAL) 10 MG tablet Take 10 mg by mouth 3 (three) times daily.    . budesonide-formoterol (SYMBICORT) 160-4.5 MCG/ACT inhaler Inhale 2 puffs into the lungs 2 (two) times daily. 1 Inhaler 5  . cyclobenzaprine (FLEXERIL) 5 MG tablet Take 5 mg by mouth 3 (three) times daily as needed for muscle spasms.    Marland Kitchen Epinastine HCl 0.05 % ophthalmic solution Place 1 drop into both eyes 2 (two) times daily as needed. 10 mL 5  . flunisolide (NASALIDE) 25 MCG/ACT (0.025%) SOLN Place 2 sprays into the nose daily as needed. 25 mL 5  . ibuprofen (ADVIL) 800 MG tablet Take 800 mg by mouth every 8 (eight) hours as needed.    . meloxicam (MOBIC) 15 MG tablet Take 15 mg by mouth daily.    . Olopatadine HCl (PATADAY) 0.2 % SOLN Place 1 drop into both eyes daily as needed. 2.5 mL 5  . pregabalin (LYRICA) 150 MG capsule Take 150 mg by mouth 2 (two) times daily.    . traMADol (ULTRAM) 50 MG tablet Take 50 mg by mouth every 6 (six) hours as needed.    . desonide (DESOWEN) 0.05 % ointment Apply 1  application topically 2 (two) times daily as needed. Rash on neck 60 g 0  . hydrOXYzine (ATARAX/VISTARIL) 25 MG tablet Take 1 tablet (25 mg total) by mouth 3 (three) times daily as needed for itching. 60 tablet 0   No current facility-administered medications for this visit.   Allergies: Allergies  Allergen Reactions  . Rocephin [Ceftriaxone Sodium In Dextrose] Hives   I reviewed her past medical history, social history, family history, and environmental history and no significant changes have been reported from her previous visit.  Review of Systems  Constitutional: Negative for appetite change, chills, fever and unexpected weight change.  HENT: Positive for rhinorrhea and sneezing. Negative for congestion.   Eyes: Negative for itching.  Respiratory: Positive for cough. Negative for chest tightness, shortness of breath and wheezing.  Gastrointestinal: Negative for abdominal pain.  Skin: Positive for rash.  Neurological: Negative for headaches.   Objective: BP 136/78 (BP Location: Left Arm, Patient Position: Sitting, Cuff Size: Normal)   Pulse 71   Temp (!) 97.3 F (36.3 C) (Temporal)   Resp 20   SpO2 98%  There is no height or weight on file to calculate BMI. Physical Exam  Constitutional: She is oriented to person, place, and time. She appears well-developed and well-nourished.  HENT:  Head: Normocephalic and atraumatic.  Right Ear: External ear normal.  Left Ear: External ear normal.  Nose: Nose normal.  Mouth/Throat: Oropharynx is clear and moist.  Eyes: Conjunctivae and EOM are normal.  Cardiovascular: Normal rate, regular rhythm and normal heart sounds. Exam reveals no gallop and no friction rub.  No murmur heard. Pulmonary/Chest: Effort normal and breath sounds normal. She has no wheezes. She has no rales.  Musculoskeletal:     Cervical back: Neck supple.  Neurological: She is alert and oriented to person, place, and time.  Skin: Skin is warm. Rash noted.    Scattered papular rash on neck.   Psychiatric: She has a normal mood and affect. Her behavior is normal.  Nursing note and vitals reviewed.  Previous notes and tests were reviewed. The plan was reviewed with the patient/family, and all questions/concerned were addressed.  It was my pleasure to see Laura Fields today and participate in her care. Please feel free to contact me with any questions or concerns.  Sincerely,  Rexene Alberts, DO Allergy & Immunology  [facility] office: [phone] [facility] office: [place] office: [phone]

## 2020-02-26 ENCOUNTER — Ambulatory Visit (INDEPENDENT_AMBULATORY_CARE_PROVIDER_SITE_OTHER): Payer: 59 | Admitting: Dermatology

## 2020-02-26 ENCOUNTER — Other Ambulatory Visit: Payer: Self-pay

## 2020-02-26 DIAGNOSIS — L821 Other seborrheic keratosis: Secondary | ICD-10-CM

## 2020-02-26 DIAGNOSIS — L810 Postinflammatory hyperpigmentation: Secondary | ICD-10-CM | POA: Diagnosis not present

## 2020-02-26 DIAGNOSIS — L811 Chloasma: Secondary | ICD-10-CM

## 2020-02-26 MED ORDER — AMBULATORY NON FORMULARY MEDICATION: 1.0000 "application " | Freq: Every evening | 0 refills | Status: AC

## 2020-02-26 NOTE — Progress Notes (Signed)
   New Patient   Subjective  Laura Fields is a 59 y.o. female who presents for the following: New Patient (Initial Visit) (Patient here today for spots on her face x years. Patient has been using OTC treatment and nothing is helping, per patient the spots are getting darker.).  Increased pigment Location: Face Duration: Years Quality: Constant Associated Signs/Symptoms: No symptoms Modifying Factors: Possible previous prescription medicine did not help Severity:  Timing: Context: Bothersome to patient   The following portions of the chart were reviewed this encounter and updated as appropriate:     Objective  Well appearing patient in no apparent distress; mood and affect are within normal limits.  A focused examination was performed including Head, face, neck, hands. Relevant physical exam findings are noted in the Assessment and Plan.   Assessment & Plan  3 types of pigmentation (face) on new patient Laura Fields. Perhaps the most disturbing are blotchy darker brown, 2 shades darker than her natural skin color spots particularly on both cheeks which are typical of melasma.  There is also a component of postinflammatory hyperpigmentation and we discussed the tiny little dark brown dots on her cheekbones that represent seborrheic keratoses and are familial.  She will initially use a #50+ SPF sunscreen each morning and apply the custom made [place] formula at night; if this is too irritating to use every night she will take a week off and use it every other night. this will take at least 10 weeks and will work better in the winter than in the summer. routine follow-up relating to this could be by phone. I also mentioned two procedures: chemical peels and light based therapy and if she desires these I will refer her to Dr. Isaac Laud at [facility].

## 2020-02-27 ENCOUNTER — Encounter: Payer: Self-pay | Admitting: Dermatology

## 2020-03-02 ENCOUNTER — Other Ambulatory Visit: Payer: Self-pay | Admitting: Allergy and Immunology

## 2020-04-01 ENCOUNTER — Telehealth: Payer: Self-pay | Admitting: *Deleted

## 2020-04-01 NOTE — Telephone Encounter (Signed)
Refill request rejected because compound (Candian Formula) isn't found in system.  Called [facility] refill x 6 per Dr. Jorja Loa.

## 2020-04-28 ENCOUNTER — Ambulatory Visit (INDEPENDENT_AMBULATORY_CARE_PROVIDER_SITE_OTHER): Payer: 59 | Admitting: Allergy and Immunology

## 2020-04-28 ENCOUNTER — Encounter: Payer: Self-pay | Admitting: Allergy and Immunology

## 2020-04-28 ENCOUNTER — Other Ambulatory Visit: Payer: Self-pay

## 2020-04-28 VITALS — BP 132/82 | HR 91 | Temp 97.9°F | Resp 20

## 2020-04-28 DIAGNOSIS — J3089 Other allergic rhinitis: Secondary | ICD-10-CM | POA: Diagnosis not present

## 2020-04-28 DIAGNOSIS — J4540 Moderate persistent asthma, uncomplicated: Secondary | ICD-10-CM | POA: Diagnosis not present

## 2020-04-28 DIAGNOSIS — H1013 Acute atopic conjunctivitis, bilateral: Secondary | ICD-10-CM | POA: Diagnosis not present

## 2020-04-28 MED ORDER — MONTELUKAST SODIUM 10 MG PO TABS: 10.0000 mg | ORAL_TABLET | Freq: Every day | ORAL | 5 refills | Status: DC

## 2020-04-28 MED ORDER — BREZTRI AEROSPHERE 160-9-4.8 MCG/ACT IN AERO: 2.0000 | INHALATION_SPRAY | Freq: Two times a day (BID) | RESPIRATORY_TRACT | 5 refills | Status: DC

## 2020-04-28 NOTE — Patient Instructions (Addendum)
Moderate persistent asthma  Prednisone has been provided, 20 mg x 4 days, 10 mg x1 day, then stop.  A prescription has been provided for BrezTri 160 g, 2 inhalations via spacer device twice daily.  A prescription has been provided for montelukast 10 mg daily at bedtime.  The potential montelukast side effect has been discussed and the patient has verbalized understanding.  Continue albuterol every 4-6 hours if needed.  The patient has been asked to contact us if her symptoms persist or progress. Otherwise, she may return for follow up in 4 months.  Seasonal allergic rhinitis with a nonallergic component Stable.  Continue appropriate environmental control measures.   Flunisolide nasal spray, 2 sprays per nostril daily if needed.  Nasal saline lavage (NeilMed) has been recommended as needed and prior to medicated nasal sprays along with instructions for proper administration.  Allergic conjunctivitis  Treatment plan as outlined above for allergic rhinitis.  Epinastine eyedrops, 1 drop per eye twice daily if needed.    Return in about 4 months (around 08/29/2020).

## 2020-04-28 NOTE — Progress Notes (Signed)
Follow-up Note  RE: Laura Fields MRN: [id number] DOB: [birth year] Date of Office Visit: 04/28/2020  Primary care provider: Patient, No Pcp Per Referring provider: No ref. provider found  History of present illness: She reports that over the past 2 months, despite compliance with Spiriva 160 g, 2 inhalations via spacer device twice daily that she has been experiencing frequent coughing and wheezing, particularly in the morning in the evening.  She has been requiring albuterol rescue twice daily on average.  She believes that her asthma symptoms have been triggered by grass pollen, particularly fresh mowing lawns.  However, she experiences asthma symptoms in the absence of pollen exposure.  She reports that her nasal allergy symptoms have been well controlled overall with fluticasone nasal spray as needed.  Assessment and plan: Moderate persistent asthma  Prednisone has been provided, 20 mg x 4 days, 10 mg x1 day, then stop.  A prescription has been provided for BrezTri 160 g, 2 inhalations via spacer device twice daily.  A prescription has been provided for montelukast 10 mg daily at bedtime.  The potential montelukast side effect has been discussed and the patient has verbalized understanding.  Continue albuterol every 4-6 hours if needed.  The patient has been asked to contact us if her symptoms persist or progress. Otherwise, she may return for follow up in 4 months.  Seasonal allergic rhinitis with a nonallergic component Stable.  Continue appropriate environmental control measures.   Flunisolide nasal spray, 2 sprays per nostril daily if needed.  Nasal saline lavage (NeilMed) has been recommended as needed and prior to medicated nasal sprays along with instructions for proper administration.  Allergic conjunctivitis  Treatment plan as outlined above for allergic rhinitis.  Epinastine eyedrops, 1 drop per eye twice daily if needed.    Meds ordered this encounter    Medications  . Budeson-Glycopyrrol-Formoterol (BREZTRI AEROSPHERE) 160-9-4.8 MCG/ACT AERO    Sig: Inhale 2 puffs into the lungs in the morning and at bedtime. Use with spacer. Rinse, gargle and spit out after use    Dispense:  10.7 g    Refill:  5  . montelukast (SINGULAIR) 10 MG tablet    Sig: Take 1 tablet (10 mg total) by mouth at bedtime.    Dispense:  30 tablet    Refill:  5    Diagnostics: Spirometry reveals an FVC of 3.09 L and an FEV1 of 2.34 L without significant postbronchodilator improvement.  This study was performed while the patient was asymptomatic.  Please see scanned spirometry results for details.    Physical examination: Blood pressure 132/82, pulse 91, temperature 97.9 F (36.6 C), resp. rate 20.  General: Alert, interactive, in no acute distress. HEENT: TMs pearly gray, turbinates mildly edematous without discharge, post-pharynx mildly erythematous. Neck: Supple without lymphadenopathy. Lungs: Clear to auscultation without wheezing, rhonchi or rales. CV: Normal S1, S2 without murmurs. Skin: Warm and dry, without lesions or rashes.  The following portions of the patient's history were reviewed and updated as appropriate: allergies, current medications, past family history, past medical history, past social history, past surgical history and problem list.  Current Outpatient Medications  Medication Sig Dispense Refill  . albuterol (VENTOLIN HFA) 108 (90 Base) MCG/ACT inhaler Inhale 2 puffs into the lungs every 6 (six) hours as needed for wheezing or shortness of breath.    . AMBULATORY NON FORMULARY MEDICATION Apply 1 application topically at bedtime. Medication Name: Compounded Hydroquinone 8%, Tretinoin 0.025%, Kojic Acid 1%, Niacinamide 4%, Fluocinolone 0.025% Cream (Skin  Medicinals) 30 g 0  . azelastine (ASTELIN) 0.1 % nasal spray Place 2 sprays into both nostrils 2 (two) times daily. (Patient taking differently: Place 2 sprays into both nostrils as needed. )  30 mL 5  . baclofen (LIORESAL) 10 MG tablet Take 10 mg by mouth 3 (three) times daily.    . budesonide-formoterol (SYMBICORT) 160-4.5 MCG/ACT inhaler Inhale 2 puffs into the lungs 2 (two) times daily. 1 Inhaler 5  . cyclobenzaprine (FLEXERIL) 5 MG tablet Take 5 mg by mouth 3 (three) times daily as needed for muscle spasms.    Marland Kitchen desonide (DESOWEN) 0.05 % ointment Apply 1 application topically 2 (two) times daily as needed. Rash on neck 60 g 0  . DULoxetine (CYMBALTA) 60 MG capsule Take 60 mg by mouth daily.    Marland Kitchen Epinastine HCl 0.05 % ophthalmic solution Place 1 drop into both eyes 2 (two) times daily as needed. 10 mL 5  . flunisolide (NASALIDE) 25 MCG/ACT (0.025%) SOLN Place 2 sprays into the nose daily as needed. 25 mL 5  . hydrOXYzine (ATARAX/VISTARIL) 25 MG tablet Take 1 tablet (25 mg total) by mouth 3 (three) times daily as needed for itching. 60 tablet 0  . meloxicam (MOBIC) 15 MG tablet Take 15 mg by mouth daily.    . pregabalin (LYRICA) 150 MG capsule Take 150 mg by mouth 2 (two) times daily.    . traMADol (ULTRAM) 50 MG tablet Take 50 mg by mouth every 6 (six) hours as needed.    . Budeson-Glycopyrrol-Formoterol (BREZTRI AEROSPHERE) 160-9-4.8 MCG/ACT AERO Inhale 2 puffs into the lungs in the morning and at bedtime. Use with spacer. Rinse, gargle and spit out after use 10.7 g 5  . ibuprofen (ADVIL) 800 MG tablet Take 800 mg by mouth every 8 (eight) hours as needed. (Patient not taking: Reported on 04/28/2020)    . montelukast (SINGULAIR) 10 MG tablet Take 1 tablet (10 mg total) by mouth at bedtime. 30 tablet 5  . Olopatadine HCl (PATADAY) 0.2 % SOLN Place 1 drop into both eyes daily as needed. (Patient not taking: Reported on 04/28/2020) 2.5 mL 5   No current facility-administered medications for this visit.    Allergies  Allergen Reactions  . Rocephin [Ceftriaxone Sodium In Dextrose] Hives   Review of systems: Review of systems negative except as noted in HPI / PMHx.  Past Medical  History:  Diagnosis Date  . Asthma     Family History  Problem Relation Age of Onset  . Hypertension Mother   . Allergic rhinitis Neg Hx   . Angioedema Neg Hx   . Asthma Neg Hx   . Eczema Neg Hx   . Immunodeficiency Neg Hx   . Urticaria Neg Hx     Social History   Socioeconomic History  . Marital status: Single    Spouse name: Not on file  . Number of children: Not on file  . Years of education: Not on file  . Highest education level: Not on file  Occupational History  . Not on file  Tobacco Use  . Smoking status: Never Smoker  . Smokeless tobacco: Never Used  Vaping Use  . Vaping Use: Never used  Substance and Sexual Activity  . Alcohol use: Yes  . Drug use: Never  . Sexual activity: Not on file  Other Topics Concern  . Not on file  Social History Narrative  . Not on file   Social Determinants of Health   Financial Resource Strain:   .  Difficulty of Paying Living Expenses:   Food Insecurity:   . Worried About Programme researcher, broadcasting/film/video in the Last Year:   . Barista in the Last Year:   Transportation Needs:   . Freight forwarder (Medical):   Marland Kitchen Lack of Transportation (Non-Medical):   Physical Activity:   . Days of Exercise per Week:   . Minutes of Exercise per Session:   Stress:   . Feeling of Stress :   Social Connections:   . Frequency of Communication with Friends and Family:   . Frequency of Social Gatherings with Friends and Family:   . Attends Religious Services:   . Active Member of Clubs or Organizations:   . Attends Banker Meetings:   Marland Kitchen Marital Status:   Intimate Partner Violence:   . Fear of Current or Ex-Partner:   . Emotionally Abused:   Marland Kitchen Physically Abused:   . Sexually Abused:     I appreciate the opportunity to take part in Arria's care. Please do not hesitate to contact me with questions.  Sincerely,   R. Jorene Guest, MD

## 2020-04-28 NOTE — Assessment & Plan Note (Signed)
   Prednisone has been provided, 20 mg x 4 days, 10 mg x1 day, then stop.  A prescription has been provided for BrezTri 160 g, 2 inhalations via spacer device twice daily.  A prescription has been provided for montelukast 10 mg daily at bedtime.  The potential montelukast side effect has been discussed and the patient has verbalized understanding.  Continue albuterol every 4-6 hours if needed.  The patient has been asked to contact us if her symptoms persist or progress. Otherwise, she may return for follow up in 4 months.

## 2020-04-28 NOTE — Assessment & Plan Note (Signed)
   Treatment plan as outlined above for allergic rhinitis.  Epinastine eyedrops, 1 drop per eye twice daily if needed.

## 2020-04-28 NOTE — Assessment & Plan Note (Signed)
Stable.  Continue appropriate environmental control measures.   Flunisolide nasal spray, 2 sprays per nostril daily if needed.  Nasal saline lavage (NeilMed) has been recommended as needed and prior to medicated nasal sprays along with instructions for proper administration.

## 2020-05-01 ENCOUNTER — Other Ambulatory Visit: Payer: Self-pay | Admitting: Allergy

## 2020-05-04 ENCOUNTER — Telehealth: Payer: Self-pay

## 2020-05-04 ENCOUNTER — Other Ambulatory Visit: Payer: Self-pay

## 2020-05-04 MED ORDER — SPIRIVA RESPIMAT 1.25 MCG/ACT IN AERS: 2.0000 | INHALATION_SPRAY | Freq: Every day | RESPIRATORY_TRACT | 5 refills | Status: DC

## 2020-05-04 MED ORDER — BUDESONIDE-FORMOTEROL FUMARATE 160-4.5 MCG/ACT IN AERO: 2.0000 | INHALATION_SPRAY | Freq: Two times a day (BID) | RESPIRATORY_TRACT | 5 refills | Status: DC

## 2020-05-04 NOTE — Telephone Encounter (Signed)
Informed pt of the change and she stated understanding and sent in rx's

## 2020-05-04 NOTE — Telephone Encounter (Signed)
Symbicort 160-4.5 micro grams, 2 inhalations via spacer device twice daily. Spiriva 1.25 micro grams, 2 inhalations daily. Thanks.

## 2020-05-04 NOTE — Telephone Encounter (Signed)
Pa submitted for breztri and was denied thru insurance because pt does not have anything other then moderate persistent asthma. Please advise to staying on current regiment

## 2020-05-25 NOTE — Telephone Encounter (Signed)
Patient wants to clarify if she is only supposed to be taking Markus Daft now that the approval has happened?

## 2020-05-25 NOTE — Telephone Encounter (Signed)
If her insurance covers [place], she is to use that. Thanks,

## 2020-05-25 NOTE — Telephone Encounter (Signed)
Patient called to notify that her insurance has changed the coverage determination for Breztri. Patient would like to know if you want her to do the [facility] or do the Symbicort & Spiriva.

## 2020-05-26 NOTE — Telephone Encounter (Signed)
Patient informed of medication information. She did verbalize understanding.

## 2020-06-18 ENCOUNTER — Other Ambulatory Visit: Payer: Self-pay | Admitting: Allergy and Immunology

## 2020-06-18 NOTE — Telephone Encounter (Signed)
Please advise as pt according to your last avs, was taking flunisolide

## 2020-06-25 ENCOUNTER — Other Ambulatory Visit: Payer: Self-pay | Admitting: Allergy and Immunology

## 2020-06-28 ENCOUNTER — Other Ambulatory Visit: Payer: Self-pay | Admitting: Allergy and Immunology

## 2020-08-20 ENCOUNTER — Other Ambulatory Visit: Payer: Self-pay | Admitting: Allergy and Immunology

## 2020-08-31 ENCOUNTER — Encounter: Payer: Self-pay | Admitting: Family Medicine

## 2020-08-31 ENCOUNTER — Ambulatory Visit (INDEPENDENT_AMBULATORY_CARE_PROVIDER_SITE_OTHER): Payer: 59 | Admitting: Family Medicine

## 2020-08-31 ENCOUNTER — Other Ambulatory Visit: Payer: Self-pay

## 2020-08-31 VITALS — BP 116/68 | HR 74 | Temp 97.0°F | Resp 16

## 2020-08-31 DIAGNOSIS — J4540 Moderate persistent asthma, uncomplicated: Secondary | ICD-10-CM

## 2020-08-31 DIAGNOSIS — H1013 Acute atopic conjunctivitis, bilateral: Secondary | ICD-10-CM | POA: Diagnosis not present

## 2020-08-31 DIAGNOSIS — J3089 Other allergic rhinitis: Secondary | ICD-10-CM | POA: Diagnosis not present

## 2020-08-31 MED ORDER — CARBINOXAMINE MALEATE 4 MG PO TABS: ORAL_TABLET | ORAL | 5 refills | Status: DC

## 2020-08-31 NOTE — Progress Notes (Addendum)
[address] [place] [address] Dept: [phone]  FOLLOW UP NOTE  Patient ID: Laura Fields, female    DOB: [birth year]  Age: 59 y.o. MRN: [id number] Date of Office Visit: 08/31/2020  Assessment  Chief Complaint: Nasal Congestion  HPI Laura Fields is a 59 year old female who presents to the clinic for follow-up visit.  She was last seen in this clinic on 04/28/2020 by Dr. Nunzio Cobbs for evaluation of asthma with acute exacerbation, allergic rhinitis, and allergic conjunctivitis.  At today's visit, she reports her asthma has improved significantly since her last visit to this clinic.  She does report shortness of breath with vigorous activity, wheezing that occurs in the morning only and resolves after blowing her nose, and occasional cough which is reported as sometimes dry and sometimes with thick clear mucus.  She continues montelukast 10 mg once a day, Breztri 2 puffs twice a day with a spacer, and albuterol between 0 times a week and 3 times a day depending on asthma triggers.  Allergic rhinitis is reported as poorly controlled with nasal congestion, clear rhinorrhea, sneezing, and postnasal drainage for which she is currently using flunisolide once a day.  Allergic conjunctivitis is reported as well controlled with symptoms including itch and occasional redness for which she uses epinastine eyedrops with relief of symptoms.  She reports that the rash she had been experiencing on her neck has completely resolved with no further issues.  She does report feeling pressure and a popping feeling in both ears, however, this feeling is more prevalent on the left side.  Her current medications are listed in the chart.   Drug Allergies:  Allergies  Allergen Reactions  . Rocephin [Ceftriaxone Sodium In Dextrose] Hives    Physical Exam: BP 116/68 (BP Location: Right Arm, Patient Position: Sitting, Cuff Size: Normal)   Pulse 74   Temp (!) 97 F (36.1 C) (Temporal)   Resp 16   SpO2 99%     Physical Exam Vitals reviewed.  Constitutional:      Appearance: Normal appearance.  HENT:     Head: Normocephalic and atraumatic.     Right Ear: Tympanic membrane normal.     Left Ear: Tympanic membrane normal.     Nose:     Comments: Bilateral nares slightly erythematous with thin clear nasal drainage noted.  Pharynx normal.  Ears normal.  Eyes normal.    Mouth/Throat:     Pharynx: Oropharynx is clear.  Eyes:     Conjunctiva/sclera: Conjunctivae normal.  Cardiovascular:     Rate and Rhythm: Normal rate and regular rhythm.     Heart sounds: Normal heart sounds. No murmur heard.   Pulmonary:     Effort: Pulmonary effort is normal.     Breath sounds: Normal breath sounds.     Comments: Lungs clear to auscultation Musculoskeletal:        General: Normal range of motion.     Cervical back: Normal range of motion and neck supple.  Skin:    General: Skin is warm and dry.     Comments: No rash noted  Neurological:     Mental Status: She is alert and oriented to person, place, and time.  Psychiatric:        Mood and Affect: Mood normal.        Behavior: Behavior normal.        Thought Content: Thought content normal.        Judgment: Judgment normal.    Diagnostics: FVC 2.83,  FEV1 2.23.  Predicted FVC 2.82, predicted FEV1 2.23.  Spirometry indicates normal ventilatory function.  Assessment and Plan: 1. Moderate persistent asthma, unspecified whether complicated   2. Seasonal allergic rhinitis with a nonallergic component   3. Allergic conjunctivitis of both eyes     Meds ordered this encounter  Medications  . Carbinoxamine Maleate 4 MG TABS    Sig: Take 1 tablet every 8 hours for runny nose.    Dispense:  90 tablet    Refill:  5    Patient Instructions  Asthma Continue Breztri-2 puffs twice a day with a spacer to prevent cough and wheeze Continue montelukast 10 mg once a day to prevent cough or wheeze Continue albuterol 2 puffs every 4 hours as needed for cough  or wheeze OR Instead use albuterol 0.083% solution via nebulizer one unit vial every 4 hours as needed for cough or wheeze We will check labs to assess candidacy for biologic agents. We will call you with the results as soon as they become available  Allergic rhinitis Begin carbinoxamine 4 mg tablets. Take 4 times a day as needed for nasal symptoms Continue flunisolide- increase to 2 sprays in each nostril 2 to 3 times a day.  In the right nostril, point the applicator out toward the right ear. In the left nostril, point the applicator out toward the left ear Consider saline nasal rinses as needed for nasal symptoms. Use this before any medicated nasal sprays for best result  Allergic conjunctivitis Continue epinastine eye drops 1 drop in each eye twice a day as needed for red, itchy eyes.  Eustachian tube dysfunction Continue flunisolide nasal spray and nasal saline rinses as listed above  Call the clinic if this treatment plan is not working well for you  Follow up in 3 months or sooner if needed   Return in about 3 months (around 12/01/2020), or if symptoms worsen or fail to improve.    Thank you for the opportunity to care for this patient.  Please do not hesitate to contact me with questions.  Thermon Leyland, FNP [facility]  ________________________________________________  I have provided oversight concerning [facility]'s evaluation and treatment of this patient's health issues addressed during today's encounter.  I agree with the assessment and therapeutic plan as outlined in the note.   Signed,   R Jorene Guest, MD

## 2020-08-31 NOTE — Patient Instructions (Signed)
Asthma Continue Breztri-2 puffs twice a day with a spacer to prevent cough and wheeze Continue montelukast 10 mg once a day to prevent cough or wheeze Continue albuterol 2 puffs every 4 hours as needed for cough or wheeze OR Instead use albuterol 0.083% solution via nebulizer one unit vial every 4 hours as needed for cough or wheeze Lets check some labs. This will help [place] control your asthma better. We will call you with the results as soon as they become available  Allergic rhinitis Begin carbinoxamine 4 mg tablets. Take 4 times a day as needed for nasal symptoms Continue flunisolide- increase to 2 sprays in each nostril 2 to 3 times a day.  In the right nostril, point the applicator out toward the right ear. In the left nostril, point the applicator out toward the left ear Consider saline nasal rinses as needed for nasal symptoms. Use this before any medicated nasal sprays for best result  Allergic conjunctivitis Continue epinastine eye drops 1 drop in each eye twice a day as needed for red, itchy eyes.  Eustachian tube dysfunction Continue flunisolide nasal spray and nasal saline rinses as listed above  Call the clinic if this treatment plan is not working well for you  Follow up in 3 months or sooner if needed.  Reducing Pollen Exposure The American Academy of Allergy, Asthma and Immunology suggests the following steps to reduce your exposure to pollen during allergy seasons. 1. Do not hang sheets or clothing out to dry; pollen may collect on these items. 2. Do not mow lawns or spend time around freshly cut grass; mowing stirs up pollen. 3. Keep windows closed at night.  Keep car windows closed while driving. 4. Minimize morning activities outdoors, a time when pollen counts are usually at their highest. 5. Stay indoors as much as possible when pollen counts or humidity is high and on windy days when pollen tends to remain in the air longer. 6. Use air conditioning when possible.   Many air conditioners have filters that trap the pollen spores. 7. Use a HEPA room air filter to remove pollen form the indoor air you breathe.

## 2020-09-03 LAB — CBC WITH DIFFERENTIAL/PLATELET
Basophils Absolute: 0 10*3/uL (ref 0.0–0.2)
Basos: 1 %
EOS (ABSOLUTE): 0 10*3/uL (ref 0.0–0.4)
Eos: 1 %
Hematocrit: 38.5 % (ref 34.0–46.6)
Hemoglobin: 13.2 g/dL (ref 11.1–15.9)
Immature Grans (Abs): 0 10*3/uL (ref 0.0–0.1)
Immature Granulocytes: 0 %
Lymphocytes Absolute: 1.8 10*3/uL (ref 0.7–3.1)
Lymphs: 29 %
MCH: 31.1 pg (ref 26.6–33.0)
MCHC: 34.3 g/dL (ref 31.5–35.7)
MCV: 91 fL (ref 79–97)
Monocytes Absolute: 0.5 10*3/uL (ref 0.1–0.9)
Monocytes: 8 %
Neutrophils Absolute: 3.8 10*3/uL (ref 1.4–7.0)
Neutrophils: 61 %
Platelets: 446 10*3/uL (ref 150–450)
RBC: 4.24 x10E6/uL (ref 3.77–5.28)
RDW: 11.9 % (ref 11.7–15.4)
WBC: 6.2 10*3/uL (ref 3.4–10.8)

## 2020-09-03 LAB — IGE: IgE (Immunoglobulin E), Serum: 36 IU/mL (ref 6–495)

## 2020-09-04 ENCOUNTER — Telehealth: Payer: Self-pay | Admitting: Family Medicine

## 2020-09-04 NOTE — Progress Notes (Signed)
Labs discussed with patient. All questions were answered.

## 2020-09-04 NOTE — Telephone Encounter (Signed)
Patient reports that her symptoms have improved slightly with the addition of carbinoxamine. She is currently taking 4 mg about 3 times a day. She agrees to take 8 mg twice a day to try to get more of an improvement in her nasal symptoms. Allergen immunotherapy discussed via telephone with moderate interest. We will mail out written information to the address listed in her chart. She will call the clinic if she has any questions or is interested in beginning allergen immunotherapy. Labs discussed and questions answered.

## 2020-09-09 ENCOUNTER — Other Ambulatory Visit: Payer: Self-pay | Admitting: Allergy and Immunology

## 2020-09-28 ENCOUNTER — Other Ambulatory Visit: Payer: Self-pay | Admitting: Allergy and Immunology

## 2020-10-25 ENCOUNTER — Other Ambulatory Visit: Payer: Self-pay | Admitting: Allergy and Immunology

## 2020-11-19 ENCOUNTER — Other Ambulatory Visit: Payer: Self-pay | Admitting: Allergy and Immunology

## 2020-11-30 NOTE — Progress Notes (Signed)
[address] [place] [address] Dept: [phone]  FOLLOW UP NOTE  Patient ID: Laura Fields, female    DOB: 06-Jun-1961  Age: 60 y.o. MRN: [id number] Date of Office Visit: 12/01/2020  Assessment  Chief Complaint: Cough (x 2 weeks )  HPI Laura Fields is a 60 year old female who presents to the clinic for follow-up visit.  She was last seen in this clinic on 08/31/2020 for evaluation of asthma, allergic rhinitis, allergic conjunctivitis, atopic dermatitis, and eustachian tube dysfunction.  At today's visit, she reports her asthma has been moderately well controlled with shortness of breath occurring with activity, wheeze about 2 days a week which is worse when she is outside her house the door open, and cough producing clear mucus in the daytime and dry cough occurring some nights.  She was started on Breztri at her last visit in an attempt to consolidate medications, however, her insurance would not cover this medication.  She continues montelukast 10 mg once a day, Symbicort 160-2 puffs twice a day with a spacer, and Spiriva 2 puffs once a day.  She reports rare use of albuterol.  Allergic rhinitis is reported as moderately well controlled with less rhinorrhea than at her last visit.  She is experiencing some clear rhinorrhea, nasal congestion, sneeze, and postnasal drainage.  She continues flunisolide and saline nasal rinses about 3 days a week as needed and carbinoxamine 4 mg tablets twice a day.  Allergic conjunctivitis is reported as well controlled with epinastine eyedrops as needed with relief of symptoms.  She reports that she occasionally gets red itchy area on her back for which she uses a daily moisturizing routine as well as desonide as needed.  The rash on her neck has completely resolved.  She denies reflux and is not currently taking a medication to control reflux.  Her current medications are listed in the chart.   Drug Allergies:  Allergies  Allergen Reactions    Rocephin [Ceftriaxone Sodium In Dextrose] Hives    Physical Exam: BP 108/76    Temp 98.5 F (36.9 C) (Temporal)    Resp 16    Physical Exam Vitals reviewed.  Constitutional:      Appearance: Normal appearance.  HENT:     Head: Normocephalic and atraumatic.     Right Ear: Tympanic membrane normal.     Left Ear: Tympanic membrane normal.     Nose:     Comments: Bilateral nares slightly erythematous with clear nasal drainage noted.  Pharynx normal.  Ears normal.  Eyes normal.    Mouth/Throat:     Pharynx: Oropharynx is clear.  Eyes:     Conjunctiva/sclera: Conjunctivae normal.  Cardiovascular:     Rate and Rhythm: Normal rate and regular rhythm.     Heart sounds: Normal heart sounds. No murmur heard.   Pulmonary:     Effort: Pulmonary effort is normal.     Breath sounds: Normal breath sounds.     Comments: Lungs clear to auscultation Musculoskeletal:        General: Normal range of motion.     Cervical back: Normal range of motion and neck supple.  Skin:    General: Skin is warm and dry.  Neurological:     Mental Status: She is alert and oriented to person, place, and time.  Psychiatric:        Mood and Affect: Mood normal.        Behavior: Behavior normal.        Thought Content:  Thought content normal.        Judgment: Judgment normal.     Diagnostics: FVC 2.43, FEV1 1.98.  Predicted FVC 2.18, predicted FEV1 2.23.  Spirometry indicates normal ventilatory function.  Assessment and Plan: 1. Moderate persistent asthma, unspecified whether complicated   2. Seasonal allergic rhinitis with a nonallergic component   3. Allergic conjunctivitis of both eyes   4. Intrinsic atopic dermatitis   5. Dysfunction of both eustachian tubes     Meds ordered this encounter  Medications   albuterol (VENTOLIN HFA) 108 (90 Base) MCG/ACT inhaler    Sig: INHALE 2 PUFFS INTO THE LUNGS EVERY 4 HOURS AS NEEDED FOR WHEEZE OR FOR SHORTNESS OF BREATH    Dispense:  18 each    Refill:  1    azelastine (ASTELIN) 0.1 % nasal spray    Sig: Place 1-2 sprays into both nostrils 2 (two) times daily as needed.    Dispense:  90 mL    Refill:  1    Dispense 90 day supply.   budesonide-formoterol (SYMBICORT) 160-4.5 MCG/ACT inhaler    Sig: Inhale 2 puffs into the lungs 2 (two) times daily.    Dispense:  3 each    Refill:  1    Dispense 90 day supply.   Carbinoxamine Maleate 4 MG TABS    Sig: Take 1 tablet every 8 hours for runny nose.    Dispense:  270 tablet    Refill:  1    Dispense 90 day supply.   desonide (DESOWEN) 0.05 % ointment    Sig: Apply 1 application topically 2 (two) times daily as needed. Apply twice daily to red itchy areas to the face.    Dispense:  45 g    Refill:  1    Dispense 90 day supply.   flunisolide (NASALIDE) 25 MCG/ACT (0.025%) SOLN    Sig: 2 sprays per nostril 2-3 times per day as needed for stuffy nose.    Dispense:  75 mL    Refill:  1   montelukast (SINGULAIR) 10 MG tablet    Sig: TAKE 1 TABLET BY MOUTH EVERYDAY AT BEDTIME    Dispense:  90 tablet    Refill:  1   Tiotropium Bromide Monohydrate (SPIRIVA RESPIMAT) 1.25 MCG/ACT AERS    Sig: INHALE 2 PUFFS BY MOUTH INTO THE LUNGS DAILY    Dispense:  12 g    Refill:  1    Dispense 90 day supply.   benzonatate (TESSALON) 100 MG capsule    Sig: Take one capsule every 8 hours for cough.    Dispense:  60 capsule    Refill:  0    Patient Instructions  Asthma Continue Symbicort 160-2 puffs twice a day with a spacer to prevent cough and wheeze Continue Spiriva 2 puffs once a day to prevent cough or wheeze Continue montelukast 10 mg once a day to prevent cough or wheeze Continue albuterol 2 puffs every 4 hours as needed for cough or wheeze OR Instead use albuterol 0.083% solution via nebulizer one unit vial every 4 hours as needed for cough or wheeze  Allergic rhinitis Increase carbinoxamine 4 mg tablets to 4 times a day as needed for nasal symptoms Continue flunisolide- increase to 2  sprays in each nostril 2 to 3 times a day.  In the right nostril, point the applicator out toward the right ear. In the left nostril, point the applicator out toward the left ear Consider saline nasal rinses as needed for  nasal symptoms. Use this before any medicated nasal sprays for best result  Cough Begin Tessalon Perles 100 mg up to three times a day as needed for cough Continue the treatment plans for asthma and allergic rhinitis  Allergic conjunctivitis Continue epinastine eye drops 1 drop in each eye twice a day as needed for red, itchy eyes.  Atopic dermatitis Continue a daily moisturizing routine Continue desonide 0.05% ointment to red, itchy areas twice a day as needed. Do not use this medication longer than 3 weeks in a row.   Eustachian tube dysfunction Continue flunisolide nasal spray and nasal saline rinses as listed above  Call the clinic if this treatment plan is not working well for you  Follow up in 6 months or sooner if needed.  Reducing Pollen Exposure The American Academy of Allergy, Asthma and Immunology suggests the following steps to reduce your exposure to pollen during allergy seasons. 1. Do not hang sheets or clothing out to dry; pollen may collect on these items. 2. Do not mow lawns or spend time around freshly cut grass; mowing stirs up pollen. 3. Keep windows closed at night.  Keep car windows closed while driving. 4. Minimize morning activities outdoors, a time when pollen counts are usually at their highest. 5. Stay indoors as much as possible when pollen counts or humidity is high and on windy days when pollen tends to remain in the air longer. 6. Use air conditioning when possible.  Many air conditioners have filters that trap the pollen spores. 7. Use a HEPA room air filter to remove pollen form the indoor air you breathe.    Return in about 6 months (around 05/31/2021), or if symptoms worsen or fail to improve.    Thank you for the opportunity to  care for this patient.  Please do not hesitate to contact me with questions.  Thermon Leyland, FNP [facility]

## 2020-11-30 NOTE — Patient Instructions (Addendum)
Asthma Continue Symbicort 160-2 puffs twice a day with a spacer to prevent cough and wheeze Continue Spiriva 2 puffs once a day to prevent cough or wheeze Continue montelukast 10 mg once a day to prevent cough or wheeze Continue albuterol 2 puffs every 4 hours as needed for cough or wheeze OR Instead use albuterol 0.083% solution via nebulizer one unit vial every 4 hours as needed for cough or wheeze  Allergic rhinitis Increase carbinoxamine 4 mg tablets to 4 times a day as needed for nasal symptoms Continue flunisolide- increase to 2 sprays in each nostril 2 to 3 times a day.  In the right nostril, point the applicator out toward the right ear. In the left nostril, point the applicator out toward the left ear Consider saline nasal rinses as needed for nasal symptoms. Use this before any medicated nasal sprays for best result  Cough Begin Tessalon Perles 100 mg up to three times a day as needed for cough Continue the treatment plans for asthma and allergic rhinitis  Allergic conjunctivitis Continue epinastine eye drops 1 drop in each eye twice a day as needed for red, itchy eyes.  Atopic dermatitis Continue a daily moisturizing routine Continue desonide 0.05% ointment to red, itchy areas twice a day as needed. Do not use this medication longer than 3 weeks in a row.   Eustachian tube dysfunction Continue flunisolide nasal spray and nasal saline rinses as listed above  Call the clinic if this treatment plan is not working well for you  Follow up in 6 months or sooner if needed.  Reducing Pollen Exposure The American Academy of Allergy, Asthma and Immunology suggests the following steps to reduce your exposure to pollen during allergy seasons. 1. Do not hang sheets or clothing out to dry; pollen may collect on these items. 2. Do not mow lawns or spend time around freshly cut grass; mowing stirs up pollen. 3. Keep windows closed at night.  Keep car windows closed while  driving. 4. Minimize morning activities outdoors, a time when pollen counts are usually at their highest. 5. Stay indoors as much as possible when pollen counts or humidity is high and on windy days when pollen tends to remain in the air longer. 6. Use air conditioning when possible.  Many air conditioners have filters that trap the pollen spores. 7. Use a HEPA room air filter to remove pollen form the indoor air you breathe.

## 2020-12-01 ENCOUNTER — Encounter: Payer: Self-pay | Admitting: Family Medicine

## 2020-12-01 ENCOUNTER — Ambulatory Visit (INDEPENDENT_AMBULATORY_CARE_PROVIDER_SITE_OTHER): Payer: 59 | Admitting: Family Medicine

## 2020-12-01 ENCOUNTER — Other Ambulatory Visit: Payer: Self-pay

## 2020-12-01 VITALS — BP 108/76 | Temp 98.5°F | Resp 16

## 2020-12-01 DIAGNOSIS — L2084 Intrinsic (allergic) eczema: Secondary | ICD-10-CM | POA: Diagnosis not present

## 2020-12-01 DIAGNOSIS — J3089 Other allergic rhinitis: Secondary | ICD-10-CM | POA: Diagnosis not present

## 2020-12-01 DIAGNOSIS — H6983 Other specified disorders of Eustachian tube, bilateral: Secondary | ICD-10-CM

## 2020-12-01 DIAGNOSIS — L2089 Other atopic dermatitis: Secondary | ICD-10-CM | POA: Insufficient documentation

## 2020-12-01 DIAGNOSIS — J4540 Moderate persistent asthma, uncomplicated: Secondary | ICD-10-CM | POA: Diagnosis not present

## 2020-12-01 DIAGNOSIS — H1013 Acute atopic conjunctivitis, bilateral: Secondary | ICD-10-CM | POA: Diagnosis not present

## 2020-12-01 DIAGNOSIS — H6993 Unspecified Eustachian tube disorder, bilateral: Secondary | ICD-10-CM

## 2020-12-01 HISTORY — DX: Intrinsic (allergic) eczema: L20.84

## 2020-12-01 MED ORDER — CARBINOXAMINE MALEATE 4 MG PO TABS: ORAL_TABLET | ORAL | 1 refills | Status: DC

## 2020-12-01 MED ORDER — ALBUTEROL SULFATE HFA 108 (90 BASE) MCG/ACT IN AERS: INHALATION_SPRAY | RESPIRATORY_TRACT | 1 refills | Status: DC

## 2020-12-01 MED ORDER — MONTELUKAST SODIUM 10 MG PO TABS: ORAL_TABLET | ORAL | 1 refills | Status: DC

## 2020-12-01 MED ORDER — AZELASTINE HCL 0.1 % NA SOLN: 1.0000 | Freq: Two times a day (BID) | NASAL | 1 refills | Status: DC | PRN

## 2020-12-01 MED ORDER — BUDESONIDE-FORMOTEROL FUMARATE 160-4.5 MCG/ACT IN AERO: 2.0000 | INHALATION_SPRAY | Freq: Two times a day (BID) | RESPIRATORY_TRACT | 1 refills | Status: DC

## 2020-12-01 MED ORDER — FLUNISOLIDE 25 MCG/ACT (0.025%) NA SOLN: NASAL | 1 refills | Status: DC

## 2020-12-01 MED ORDER — BENZONATATE 100 MG PO CAPS: ORAL_CAPSULE | ORAL | 0 refills | Status: DC

## 2020-12-01 MED ORDER — DESONIDE 0.05 % EX OINT: 1.0000 "application " | TOPICAL_OINTMENT | Freq: Two times a day (BID) | CUTANEOUS | 1 refills | Status: DC | PRN

## 2020-12-01 MED ORDER — SPIRIVA RESPIMAT 1.25 MCG/ACT IN AERS: INHALATION_SPRAY | RESPIRATORY_TRACT | 1 refills | Status: DC

## 2021-01-15 ENCOUNTER — Telehealth: Payer: Self-pay | Admitting: Family Medicine

## 2021-01-15 MED ORDER — PREDNISONE 10 MG PO TABS: ORAL_TABLET | ORAL | 0 refills | Status: DC

## 2021-01-15 NOTE — Telephone Encounter (Signed)
Spoke to pt. And she is using her nasal rinse twice daily now for a week since the pollen has been and windy. She is using all her nasal sprays and pt. Says the only thing that helps is when she gets this bad is when Dr. Nunzio Cobbs would give her a little bit of prednisone to help knock it out. Please advise.

## 2021-01-15 NOTE — Telephone Encounter (Signed)
Thank you will let patient know. Sent in prednisone 10 mg 2 for 4 days, then 1 tablet on day 5. Pt. aware

## 2021-01-15 NOTE — Telephone Encounter (Signed)
Can you please ask if she has a fever, any sick contacts, nasal congestion, nasal drainage and color? Thank you

## 2021-01-15 NOTE — Telephone Encounter (Signed)
Called pt. None to all your questions

## 2021-01-15 NOTE — Telephone Encounter (Signed)
Please call in Prednisone 10 mg tablets. Take 2 tablets once a day for 4 days, then take 1 tablet on the 5th day, then stop. Thank you. Please have her follow up in the clinic if not completely resolved over the weekend. Thank you

## 2021-01-15 NOTE — Telephone Encounter (Signed)
Pt requests prednisone for sinus issues

## 2021-01-21 ENCOUNTER — Other Ambulatory Visit: Payer: Self-pay | Admitting: Family Medicine

## 2021-01-22 ENCOUNTER — Other Ambulatory Visit: Payer: Self-pay | Admitting: *Deleted

## 2021-01-22 MED ORDER — OLOPATADINE HCL 0.6 % NA SOLN: NASAL | 5 refills | Status: DC

## 2021-01-22 NOTE — Telephone Encounter (Signed)
Can you please order patanase 2 sprays in each nostril up to twice a day as needed for nasal symptoms. Thank you

## 2021-01-22 NOTE — Telephone Encounter (Signed)
Please advise 

## 2021-01-22 NOTE — Telephone Encounter (Signed)
New prescription has been sent in. Called and left a detailed voicemail per DPR permission advising the patient of chang ein nasal spray.

## 2021-01-27 ENCOUNTER — Other Ambulatory Visit: Payer: Self-pay | Admitting: Family Medicine

## 2021-01-27 NOTE — Telephone Encounter (Signed)
We placed this order on 01/22/2221 to replace the azelastine. Thank you    Olopatadine HCl 0.6 % SOLN [[id number]]   Order Details Dose, Route, Frequency: As Directed Dispense Quantity: 30.5 g Refills: 5     Sig: 2 sprays each nostril up to 2 times daily as needed for nasal symptoms.

## 2021-01-27 NOTE — Telephone Encounter (Signed)
Please advise to change in nasal sprays as there is a national back order for azelastine

## 2021-01-28 ENCOUNTER — Ambulatory Visit (INDEPENDENT_AMBULATORY_CARE_PROVIDER_SITE_OTHER): Payer: 59 | Admitting: Podiatry

## 2021-01-28 ENCOUNTER — Ambulatory Visit (INDEPENDENT_AMBULATORY_CARE_PROVIDER_SITE_OTHER): Payer: 59

## 2021-01-28 ENCOUNTER — Other Ambulatory Visit: Payer: Self-pay

## 2021-01-28 DIAGNOSIS — M7661 Achilles tendinitis, right leg: Secondary | ICD-10-CM

## 2021-01-28 DIAGNOSIS — M722 Plantar fascial fibromatosis: Secondary | ICD-10-CM | POA: Diagnosis not present

## 2021-01-28 MED ORDER — METHYLPREDNISOLONE 4 MG PO TBPK: ORAL_TABLET | ORAL | 0 refills | Status: DC

## 2021-01-28 MED ORDER — DEXAMETHASONE SODIUM PHOSPHATE 120 MG/30ML IJ SOLN
2.0000 mg | Freq: Once | INTRAMUSCULAR | Status: AC
  Administered 2021-01-28: 2 mg via INTRA_ARTICULAR

## 2021-01-28 MED ORDER — TRIAMCINOLONE ACETONIDE 40 MG/ML IJ SUSP
20.0000 mg | Freq: Once | INTRAMUSCULAR | Status: AC
  Administered 2021-01-28: 20 mg

## 2021-01-30 NOTE — Progress Notes (Signed)
Subjective:  Patient ID: Laura Fields, female    DOB: January 23, 1961,  MRN: [id number] HPI No chief complaint on file.   60 y.o. female presents with the above complaint.   ROS: Denies fever chills nausea vomiting muscle aches pains calf pain back pain chest pain shortness of breath.  She has seen Dr. Johnny Bridge.Ajoulony.  She was told that she could only have surgery to fix this.  She states that she is looking for another alternative.  She states is been been in and out of her boot for the past year.  Physical therapy months ago she states that she went for 3 days they wanted to do it for 3 months and she would not do that. Past Medical History:  Diagnosis Date  . Asthma   . Intrinsic atopic dermatitis 12/01/2020   Past Surgical History:  Procedure Laterality Date  . ABDOMINAL HYSTERECTOMY    . back nerve     block  . GASTRIC BYPASS    . KNEE ARTHROSCOPY    . mass removable     on thumb  . STOMACH SURGERY      Current Outpatient Medications:  .  methylPREDNISolone (MEDROL DOSEPAK) 4 MG TBPK tablet, 6 day dose pack - take as directed, Disp: 21 tablet, Rfl: 0 .  albuterol (VENTOLIN HFA) 108 (90 Base) MCG/ACT inhaler, INHALE 2 PUFFS INTO THE LUNGS EVERY 4 HOURS AS NEEDED FOR WHEEZE OR FOR SHORTNESS OF BREATH, Disp: 18 each, Rfl: 1 .  AMBULATORY NON FORMULARY MEDICATION, Apply 1 application topically at bedtime. Medication Name: Compounded Hydroquinone 8%, Tretinoin 0.025%, Kojic Acid 1%, Niacinamide 4%, Fluocinolone 0.025% Cream (Skin Medicinals), Disp: 30 g, Rfl: 0 .  baclofen (LIORESAL) 10 MG tablet, Take 10 mg by mouth 3 (three) times daily., Disp: , Rfl:  .  benzonatate (TESSALON) 100 MG capsule, Take one capsule every 8 hours for cough., Disp: 60 capsule, Rfl: 0 .  budesonide-formoterol (SYMBICORT) 160-4.5 MCG/ACT inhaler, Inhale 2 puffs into the lungs 2 (two) times daily., Disp: 3 each, Rfl: 1 .  Carbinoxamine Maleate 4 MG TABS, Take 1 tablet every 8 hours for runny nose., Disp: 270  tablet, Rfl: 1 .  cyclobenzaprine (FLEXERIL) 5 MG tablet, Take 5 mg by mouth 3 (three) times daily as needed for muscle spasms., Disp: , Rfl:  .  desonide (DESOWEN) 0.05 % ointment, Apply 1 application topically 2 (two) times daily as needed. Apply twice daily to red itchy areas to the face., Disp: 45 g, Rfl: 1 .  DULoxetine (CYMBALTA) 60 MG capsule, Take 60 mg by mouth daily., Disp: , Rfl:  .  Epinastine HCl 0.05 % ophthalmic solution, Apply to eye as needed., Disp: , Rfl:  .  flunisolide (NASALIDE) 25 MCG/ACT (0.025%) SOLN, 2 sprays per nostril 2-3 times per day as needed for stuffy nose., Disp: 75 mL, Rfl: 1 .  fluticasone (FLONASE) 50 MCG/ACT nasal spray, SPRAY 2 SPRAYS INTO EACH NOSTRIL EVERY DAY (Patient not taking: Reported on 12/01/2020), Disp: 48 mL, Rfl: 1 .  hydrOXYzine (ATARAX/VISTARIL) 25 MG tablet, Take 1 tablet (25 mg total) by mouth 3 (three) times daily as needed for itching., Disp: 60 tablet, Rfl: 0 .  ibuprofen (ADVIL) 800 MG tablet, Take 800 mg by mouth as needed., Disp: , Rfl:  .  meloxicam (MOBIC) 15 MG tablet, Take 15 mg by mouth daily., Disp: , Rfl:  .  montelukast (SINGULAIR) 10 MG tablet, TAKE 1 TABLET BY MOUTH EVERYDAY AT BEDTIME, Disp: 90 tablet, Rfl: 1 .  Olopatadine HCl (PATADAY) 0.2 % SOLN, Place 1 drop into both eyes daily as needed. (Patient not taking: Reported on 12/01/2020), Disp: 2.5 mL, Rfl: 5 .  Olopatadine HCl 0.6 % SOLN, 2 sprays each nostril up to 2 times daily as needed for nasal symptoms., Disp: 30.5 g, Rfl: 5 .  Olopatadine HCl 0.6 % SOLN, Place 2 sprays into both nostrils 2 (two) times daily as needed., Disp: 30.5 g, Rfl: 5 .  predniSONE (DELTASONE) 10 MG tablet, Take 1 tablet twice a day for 4 days, then 1 tablet on day 5, then stop, Disp: 9 tablet, Rfl: 0 .  pregabalin (LYRICA) 150 MG capsule, Take 150 mg by mouth 2 (two) times daily., Disp: , Rfl:  .  Tiotropium Bromide Monohydrate (SPIRIVA RESPIMAT) 1.25 MCG/ACT AERS, INHALE 2 PUFFS BY MOUTH INTO THE  LUNGS DAILY, Disp: 12 g, Rfl: 1  Allergies  Allergen Reactions  . Rocephin [Ceftriaxone Sodium In Dextrose] Hives   Review of Systems Objective:  There were no vitals filed for this visit.  General: Well developed, nourished, in no acute distress, alert and oriented x3   Dermatological: Skin is warm, dry and supple bilateral. Nails x 10 are well maintained; remaining integument appears unremarkable at this time. There are no open sores, no preulcerative lesions, no rash or signs of infection present.  Vascular: Dorsalis Pedis artery and Posterior Tibial artery pedal pulses are 2/4 bilateral with immedate capillary fill time. Pedal hair growth present. No varicosities and no lower extremity edema present bilateral.   Neruologic: Grossly intact via light touch bilateral. Vibratory intact via tuning fork bilateral. Protective threshold with Semmes Wienstein monofilament intact to all pedal sites bilateral. Patellar and Achilles deep tendon reflexes 2+ bilateral. No Babinski or clonus noted bilateral.   Musculoskeletal: No gross boney pedal deformities bilateral. No pain, crepitus, or limitation noted with foot and ankle range of motion bilateral. Muscular strength 5/5 in all groups tested bilateral.  She has pain on palpation to the posterior inferior aspect of the Achilles with fluctuance beneath the skin superficially.  Most consistent with retrocalcaneal bursitis she also has pain on palpation medial calcaneal tubercle of the same foot.  Consistent with plantar fasciitis.  No pain on medial lateral compression of the calcaneus.  Gait: Unassisted, Nonantalgic.    Radiographs:  Radiographs taken today demonstrate an osseously mature individual plantar distal aorticocaval heel spur retrocalcaneal heel spur is also noted thickening of the Achilles tendon is noted as it descends from its normal appearance in the gastrosoleus complex to the posterior aspect of the calcaneus with a very large  retrocalcaneal heel spur proximally oriented.  Plantar distally oriented calcaneal heel spurs consistent with chronic pole of the plantar fascia.  Plantar fasciitis is indicated with soft tissue increase in density plantar fascial kidney insertion site.  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  She also has plantar fasciitis with plantar distal aorticocaval heel spur.    Plan: Discussed etiology pathology conservative versus surgical therapies at this point I injected subcutaneously 2 mg of dexamethasone local anesthetic to the bursa posteriorly.  Also injected 20 mg Kenalog 5 mg Marcaine point of maximal tenderness of the right heel at the plantar calcaneal insertion site.  She tolerated procedure well without complications.  Start her on methylprednisolone plus her night splint I will follow-up with her in 1 month.     Zada Haser T. [place], [place]

## 2021-02-06 ENCOUNTER — Other Ambulatory Visit: Payer: Self-pay | Admitting: Family Medicine

## 2021-02-18 ENCOUNTER — Telehealth: Payer: Self-pay | Admitting: Family Medicine

## 2021-02-18 NOTE — Telephone Encounter (Signed)
Pt request a call back she is coughing and has yellow mucus.

## 2021-02-18 NOTE — Telephone Encounter (Signed)
Spoke with patient, informed her of Anne's recommendation. Patient verbalized understanding and will reach out if she gets worse.

## 2021-02-18 NOTE — Telephone Encounter (Signed)
Spoke with patient, she stated that for the past two days she has had a migraine, coughing, has some yellow mucus. Patient stated that this happens every time she goes outside during pollen season. She stated that she went to a funeral yesterday and now she sounds bad. Patient is taking her nasal sprays and antihistamine with no relief. Patient is wanting to know what else she can do. Please advise.

## 2021-02-18 NOTE — Telephone Encounter (Signed)
Can you please have her increase her carbinoxamine to 2 tablets twice a day and continue with saline nasal rinses several times a day in addition to her nasal sprays. Please send out information on allergen immunotherapy. She may also need an evaluation by an ENT specialist. Please have her continue all her inhalers and if her symptoms worsen to call the clinic or come in for an appointment. Thank you

## 2021-03-02 ENCOUNTER — Other Ambulatory Visit: Payer: Self-pay

## 2021-03-02 ENCOUNTER — Encounter: Payer: Self-pay | Admitting: Podiatry

## 2021-03-02 ENCOUNTER — Ambulatory Visit (INDEPENDENT_AMBULATORY_CARE_PROVIDER_SITE_OTHER): Payer: 59 | Admitting: Podiatry

## 2021-03-02 DIAGNOSIS — S93691A Other sprain of right foot, initial encounter: Secondary | ICD-10-CM | POA: Diagnosis not present

## 2021-03-02 DIAGNOSIS — S86011A Strain of right Achilles tendon, initial encounter: Secondary | ICD-10-CM | POA: Diagnosis not present

## 2021-03-02 NOTE — Progress Notes (Signed)
She presents today for follow-up of her Achilles tendinitis and plantar fasciitis states that is no better states that the plan fasciitis in the lateral aspect of her foot turned black and blue after that plantar fascial injection but that part seems to be doing a little bit better though the Achilles is more painful.  She also states that area of the plantar aspect of the heel is more painful as well.  Objective: Vital signs are stable she alert and x3 still has severe pain on palpation medial calcaneal tubercle as well as the Achilles insertion site of her right heel.  Forced dorsiflexion severely painful and plantar flexion against resistance is painful.  Assessment: Probable tear of the plantar fascia and the Achilles tendon.  This has been treated long-term by a different doctor and has failed to improve even with our conservative therapies.  Plan: At this point requesting MRI of the right rear foot for follow-up of the plantar fascial tear and an Achilles tendon tear.

## 2021-03-14 ENCOUNTER — Other Ambulatory Visit: Payer: Self-pay

## 2021-03-14 ENCOUNTER — Ambulatory Visit
Admission: RE | Admit: 2021-03-14 | Discharge: 2021-03-14 | Disposition: A | Discharge status: Home / Self Care | Payer: 59 | Source: Ambulatory Visit | Attending: Podiatry | Admitting: Podiatry

## 2021-03-14 DIAGNOSIS — S93691A Other sprain of right foot, initial encounter: Secondary | ICD-10-CM

## 2021-03-14 DIAGNOSIS — S86011A Strain of right Achilles tendon, initial encounter: Secondary | ICD-10-CM

## 2021-03-14 IMAGING — MR MR ANKLE*R* W/O CM
6 series · 40 of 40 positions shown · non-contrast
Comparison: None.

CLINICAL DATA: Posterior and inferior right ankle pain since the
patient felt a pop in her ankle approximately 1 year ago.

EXAM:
MRI OF THE RIGHT ANKLE WITHOUT CONTRAST
TECHNIQUE: Multiplanar, multisequence MR imaging of the ankle was performed. No
intravenous contrast was administered.

[Series 3: T2 fat-sat · axial · 3.0mm · 0.62mm/px · z∈[-31,+90]mm · 8 of 32 slices shown (1 of 2)]
[im 1/32]
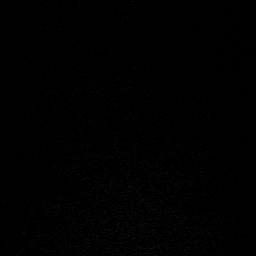
[im 5/32]
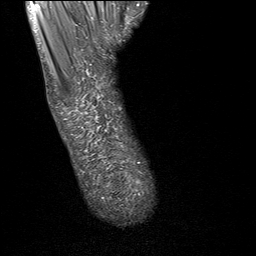
[im 9/32]
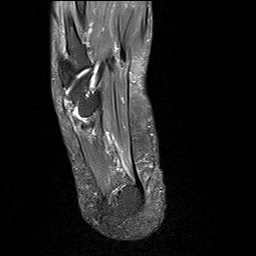
[im 14/32]
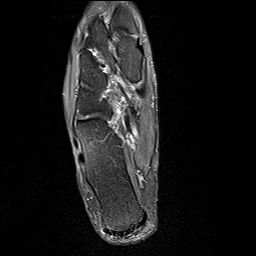
[im 18/32]
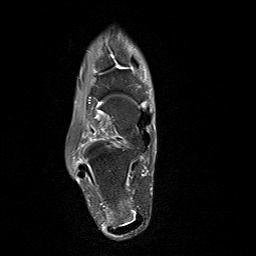
[im 23/32]
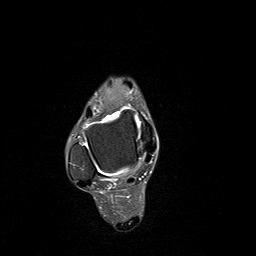
[im 27/32]
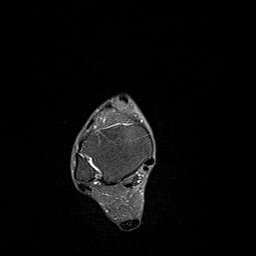
[im 32/32]
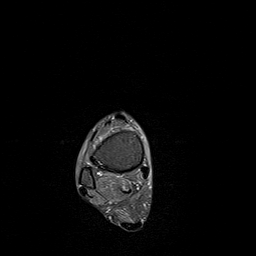

[Series 4: PD fat-sat · axial · 3.0mm · 0.62mm/px · z∈[-31,+90]mm · 7 of 32 slices shown]
[im 1/32]
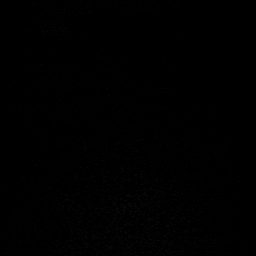
[im 6/32]
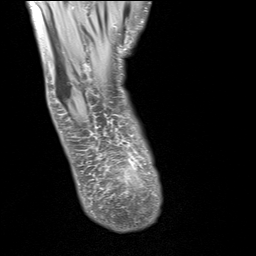
[im 11/32]
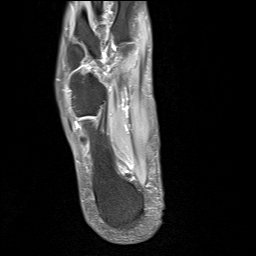
[im 16/32]
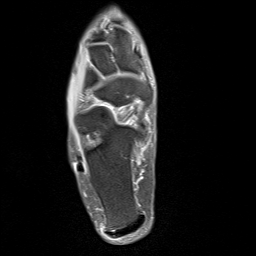
[im 21/32]
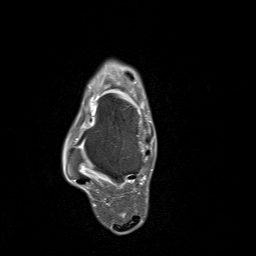
[im 26/32]
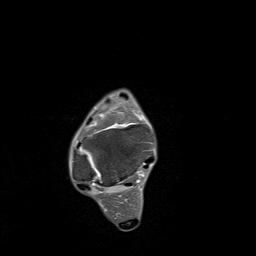
[im 32/32]
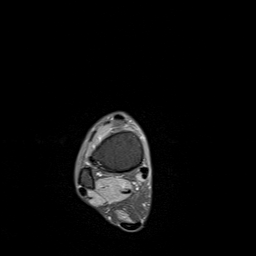

[Series 5: T1 · sagittal · 4.0mm · 0.70mm/px · 5 of 22 slices shown (1 of 2)]
[im 1/22]
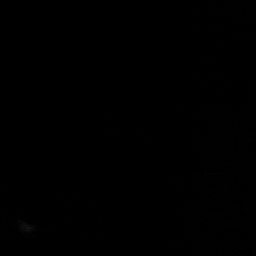
[im 6/22]
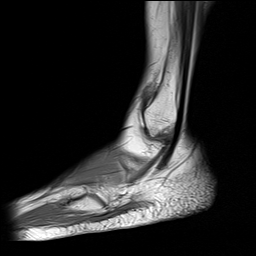
[im 11/22]
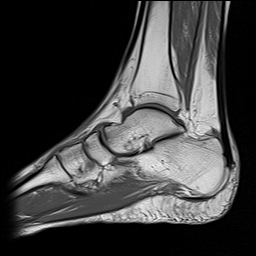
[im 16/22]
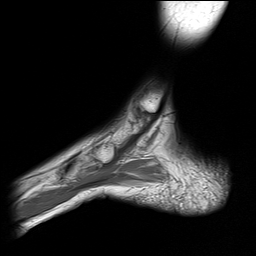
[im 22/22]
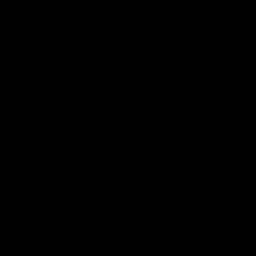

[Series 6: STIR · sagittal · 4.0mm · 0.35mm/px · 5 of 22 slices shown]
[im 1/22]
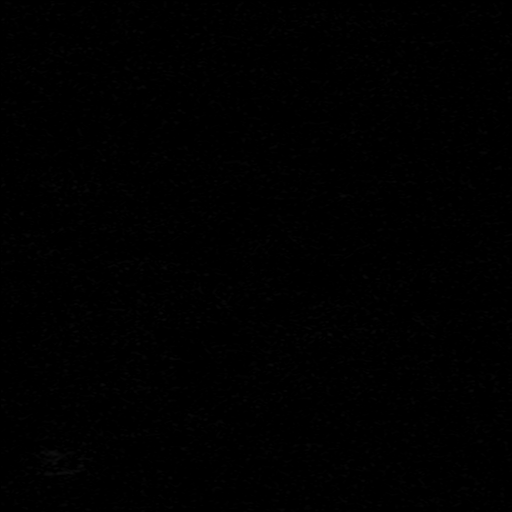
[im 6/22]
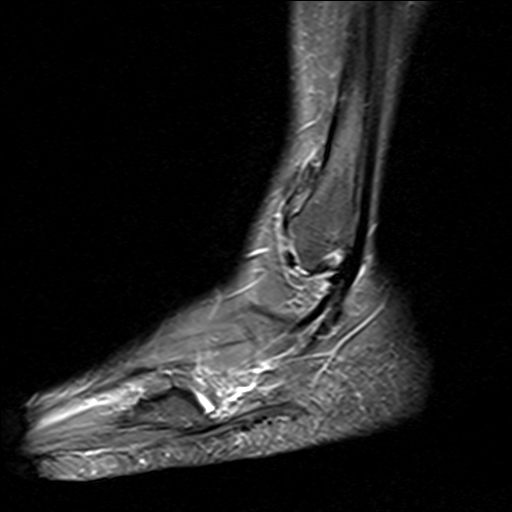
[im 11/22]
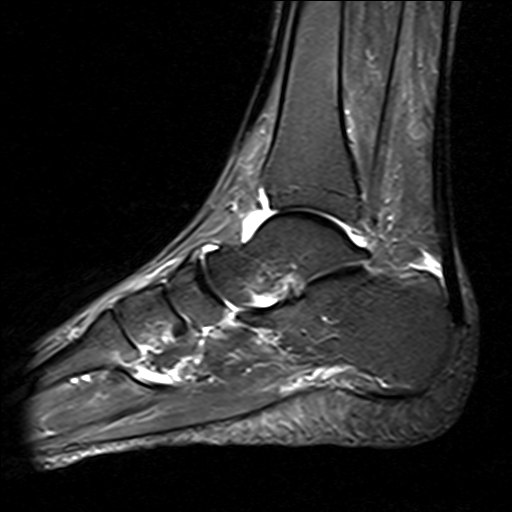
[im 16/22]
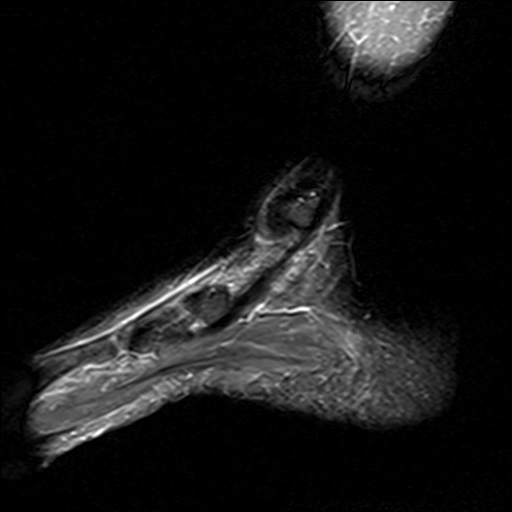
[im 22/22]
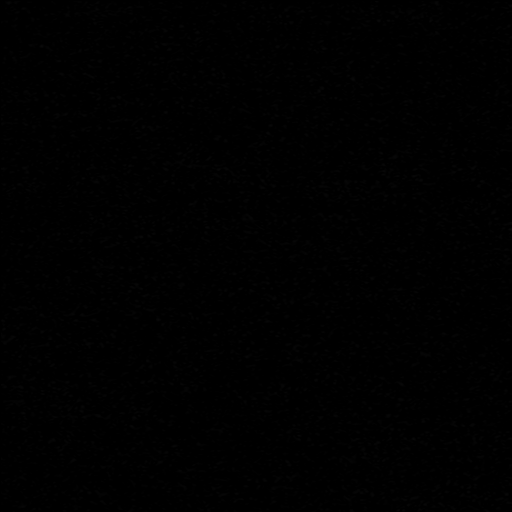

[Series 7: T2 fat-sat · coronal · 3.0mm · 0.62mm/px · 8 of 35 slices shown (2 of 2)]
[im 1/35]
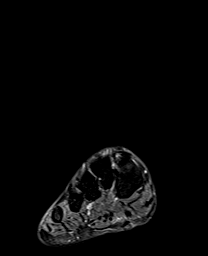
[im 5/35]
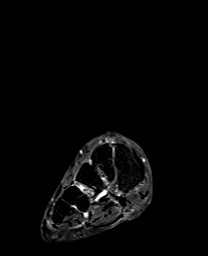
[im 10/35]
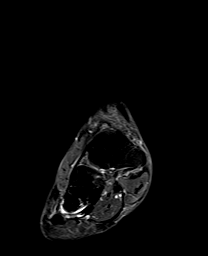
[im 15/35]
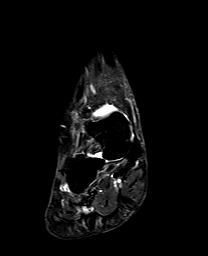
[im 20/35]
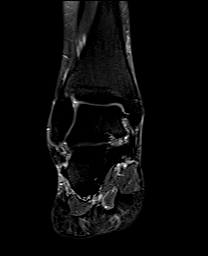
[im 25/35]
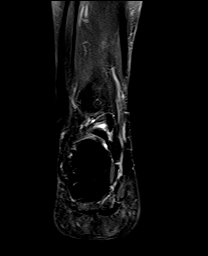
[im 30/35]
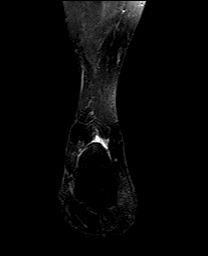
[im 35/35]
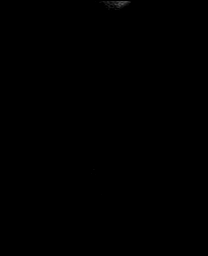

[Series 8: T1 · axial · 3.0mm · 0.62mm/px · z∈[-26,+87]mm · 7 of 30 slices shown (2 of 2)]
[im 1/30]
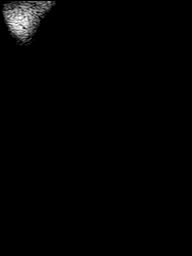
[im 5/30]
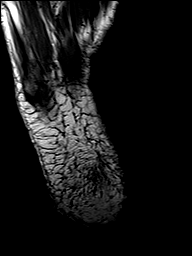
[im 10/30]
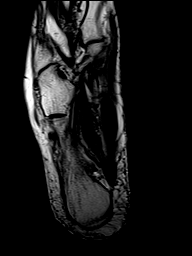
[im 15/30]
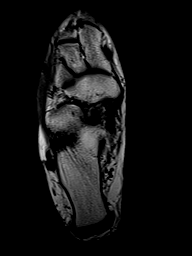
[im 20/30]
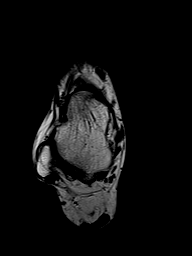
[im 25/30]
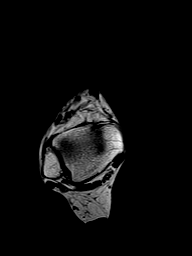
[im 30/30]
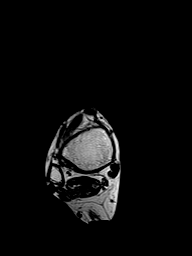

[40 of 40 positions shown; findings below may reference images not displayed]

FINDINGS: TENDONS

Peroneal: Intact.

Posteromedial: Intact.

Anterior: Intact.

Achilles: Intact. There is mild intrasubstance increased T2 signal
in the distal tendon. A small volume of fluid is seen in the
retrocalcaneal bursa. The posterosuperior calcaneus is mildly
prominent.

Plantar Fascia: Intact.  Negative for plantar fasciitis.

LIGAMENTS

Lateral: Intact.

Medial: Intact.

CARTILAGE

Ankle Joint: Small joint effusion. No osteochondral lesion of the
talar dome.

Subtalar Joints/Sinus Tarsi: Normal.

Bones: Normal marrow signal throughout.

Other: None.
IMPRESSION: Mild appearing Achilles tendinosis without tear and fluid in the
retrocalcaneal bursa consistent with bursitis. Mild prominence of
the posterosuperior calcaneus is noted. Findings are suggestive of
Haglund's deformity.

## 2021-03-16 ENCOUNTER — Other Ambulatory Visit: Payer: Self-pay

## 2021-03-16 ENCOUNTER — Ambulatory Visit (INDEPENDENT_AMBULATORY_CARE_PROVIDER_SITE_OTHER): Payer: 59 | Admitting: Family

## 2021-03-16 ENCOUNTER — Encounter: Payer: Self-pay | Admitting: *Deleted

## 2021-03-16 ENCOUNTER — Encounter: Payer: Self-pay | Admitting: Family

## 2021-03-16 VITALS — BP 122/80 | HR 74 | Temp 98.2°F | Ht 64.5 in | Wt 179.4 lb

## 2021-03-16 DIAGNOSIS — R350 Frequency of micturition: Secondary | ICD-10-CM | POA: Diagnosis not present

## 2021-03-16 DIAGNOSIS — L089 Local infection of the skin and subcutaneous tissue, unspecified: Secondary | ICD-10-CM | POA: Diagnosis not present

## 2021-03-16 DIAGNOSIS — L720 Epidermal cyst: Secondary | ICD-10-CM

## 2021-03-16 DIAGNOSIS — Z1231 Encounter for screening mammogram for malignant neoplasm of breast: Secondary | ICD-10-CM

## 2021-03-16 MED ORDER — SULFAMETHOXAZOLE-TRIMETHOPRIM 800-160 MG PO TABS: 1.0000 | ORAL_TABLET | Freq: Two times a day (BID) | ORAL | 0 refills | Status: DC

## 2021-03-16 MED ORDER — NYSTATIN-TRIAMCINOLONE 100000-0.1 UNIT/GM-% EX OINT: 1.0000 "application " | TOPICAL_OINTMENT | Freq: Two times a day (BID) | CUTANEOUS | 0 refills | Status: DC

## 2021-03-16 NOTE — Progress Notes (Signed)
Laura Fields is a 60 y.o. female with the following history as recorded in Epic[employer]:  Patient Active Problem List   Diagnosis Date Noted  . Intrinsic atopic dermatitis 12/01/2020  . Dysfunction of both eustachian tubes 12/01/2020  . Pruritic rash 02/12/2020  . Allergic conjunctivitis of both eyes 02/12/2020  . Seasonal allergic rhinitis with a nonallergic component 12/25/2019  . Allergic conjunctivitis 12/25/2019  . Moderate persistent asthma 12/25/2019    Current Outpatient Medications  Medication Sig Dispense Refill  . albuterol (VENTOLIN HFA) 108 (90 Base) MCG/ACT inhaler INHALE 2 PUFFS INTO THE LUNGS EVERY 4 HOURS AS NEEDED FOR WHEEZE OR FOR SHORTNESS OF BREATH 18 each 1  . AMBULATORY NON FORMULARY MEDICATION Apply 1 application topically at bedtime. Medication Name: Compounded Hydroquinone 8%, Tretinoin 0.025%, Kojic Acid 1%, Niacinamide 4%, Fluocinolone 0.025% Cream (Skin Medicinals) 30 g 0  . baclofen (LIORESAL) 10 MG tablet Take 10 mg by mouth 3 (three) times daily.    . budesonide-formoterol (SYMBICORT) 160-4.5 MCG/ACT inhaler Inhale 2 puffs into the lungs 2 (two) times daily. 3 each 1  . Carbinoxamine Maleate 4 MG TABS Take 1 tablet every 8 hours for runny nose. 270 tablet 1  . cyclobenzaprine (FLEXERIL) 5 MG tablet Take 5 mg by mouth 3 (three) times daily as needed for muscle spasms.    Marland Kitchen desonide (DESOWEN) 0.05 % ointment Apply 1 application topically 2 (two) times daily as needed. Apply twice daily to red itchy areas to the face. 45 g 1  . DULoxetine (CYMBALTA) 60 MG capsule Take 60 mg by mouth daily.    Marland Kitchen Epinastine HCl 0.05 % ophthalmic solution Apply to eye as needed.    . flunisolide (NASALIDE) 25 MCG/ACT (0.025%) SOLN 2 sprays per nostril 2-3 times per day as needed for stuffy nose. 75 mL 1  . hydrOXYzine (ATARAX/VISTARIL) 25 MG tablet Take 1 tablet (25 mg total) by mouth 3 (three) times daily as needed for itching. 60 tablet 0  . meloxicam (MOBIC) 15 MG tablet Take 15 mg  by mouth daily.    . montelukast (SINGULAIR) 10 MG tablet TAKE 1 TABLET BY MOUTH EVERYDAY AT BEDTIME 90 tablet 1  . nystatin-triamcinolone ointment (MYCOLOG) Apply 1 application topically 2 (two) times daily. 30 g 0  . Olopatadine HCl (PATADAY) 0.2 % SOLN Place 1 drop into both eyes daily as needed. 2.5 mL 5  . Olopatadine HCl 0.6 % SOLN Place 2 sprays into both nostrils 2 (two) times daily as needed. 30.5 g 5  . pregabalin (LYRICA) 150 MG capsule Take 150 mg by mouth 2 (two) times daily.    Marland Kitchen sulfamethoxazole-trimethoprim (BACTRIM DS) 800-160 MG tablet Take 1 tablet by mouth 2 (two) times daily. 10 tablet 0  . Tiotropium Bromide Monohydrate (SPIRIVA RESPIMAT) 1.25 MCG/ACT AERS INHALE 2 PUFFS BY MOUTH INTO THE LUNGS DAILY 12 g 1   No current facility-administered medications for this visit.    Allergies: Rocephin [ceftriaxone sodium in dextrose]  Past Medical History:  Diagnosis Date  . Asthma   . Intrinsic atopic dermatitis 12/01/2020    Past Surgical History:  Procedure Laterality Date  . ABDOMINAL HYSTERECTOMY    . back nerve     block  . GASTRIC BYPASS    . KNEE ARTHROSCOPY    . mass removable     on thumb  . STOMACH SURGERY      Family History  Problem Relation Age of Onset  . Hypertension Mother   . Allergic rhinitis Neg Hx   .  Angioedema Neg Hx   . Asthma Neg Hx   . Eczema Neg Hx   . Immunodeficiency Neg Hx   . Urticaria Neg Hx     Social History   Tobacco Use  . Smoking status: Passive Smoke Exposure - Never Smoker  . Smokeless tobacco: Never Used  Substance Use Topics  . Alcohol use: Yes    Subjective:  Presents today as a new patient; needs to establish with new PCP but majority of healthcare needs including labs are managed by her specialists including:  1) Allergist; 2) Back and spine specialist through [facility]- managing Lyrica and Cymbalta; 3) Does have rheumatologist through [facility]- was told she has rheumatoid syndrome but does not need medication at this  time; 4) Cardiology yearly; 5) Dermatology for melasma;   Requesting urine to be checked to make sure she does not have infection- has been having increase urinary frequency but suspects due to change in detergents; Concerned about possible infection in umbilical area- complaining of "odor";  Needs updated order for mammogram;    Objective:  Vitals:   03/16/21 1351  BP: 122/80  Pulse: 74  Temp: 98.2 F (36.8 C)  TempSrc: Oral  SpO2: 99%  Weight: 179 lb 6.4 oz (81.4 kg)  Height: 5' 4.5" (1.638 m)    General: Well developed, well nourished, in no acute distress  Skin : Warm and dry.  Head: Normocephalic and atraumatic  Eyes: Sclera and conjunctiva clear; pupils round and reactive to light; extraocular movements intact  Ears: External normal; canals clear; tympanic membranes normal  Oropharynx: Pink, supple. No suspicious lesions  Neck: Supple without thyromegaly, adenopathy  Lungs: Respirations unlabored; clear to auscultation bilaterally without wheeze, rales, rhonchi  CVS exam: normal rate and regular rhythm.  Neurologic: Alert and oriented; speech intact; face symmetrical; moves all extremities well; CNII-XII intact without focal deficit   Assessment:  1. Urinary frequency   2. Visit for screening mammogram   3. Epidermal cyst of neck   4. Skin infection     Plan:  1. Check urine culture; 2. Order updated; 3. Refer back to her dermatologist; 4. Will treat with combination of Doxycycline and Mycolog; follow up worse, no better;   Will plan for follow up in 1 year, sooner prn since labs are managed by specialists;   This visit occurred during the SARS-CoV-2 public health emergency.  Safety protocols were in place, including screening questions prior to the visit, additional usage of staff PPE, and extensive cleaning of exam room while observing appropriate contact time as indicated for disinfecting solutions.     No follow-ups on file.  Orders Placed This Encounter   Procedures  . Urine Culture  . MM Digital Screening    Standing Status:   Future    Standing Expiration Date:   03/16/2022    Order Specific Question:   Reason for Exam (SYMPTOM  OR DIAGNOSIS REQUIRED)    Answer:   screening mammogram    Order Specific Question:   Is the patient pregnant?    Answer:   No    Order Specific Question:   Preferred imaging location?    Answer:   [facility]  . Ambulatory referral to Dermatology    Referral Priority:   Routine    Referral Type:   Consultation    Referral Reason:   Specialty Services Required    Referred to Provider:   Janalyn Harder, MD    Requested Specialty:   Dermatology    Number  of Visits Requested:   1    Requested Prescriptions   Signed Prescriptions Disp Refills  . sulfamethoxazole-trimethoprim (BACTRIM DS) 800-160 MG tablet 10 tablet 0    Sig: Take 1 tablet by mouth 2 (two) times daily.  Marland Kitchen nystatin-triamcinolone ointment (MYCOLOG) 30 g 0    Sig: Apply 1 application topically 2 (two) times daily.

## 2021-03-18 ENCOUNTER — Telehealth: Payer: Self-pay | Admitting: *Deleted

## 2021-03-18 NOTE — Telephone Encounter (Signed)
Patient is calling for the status of an MRI that was completed on 03/14/21. Please advise.

## 2021-03-18 NOTE — Telephone Encounter (Signed)
Sent to you about getting over read

## 2021-03-19 ENCOUNTER — Telehealth: Payer: Self-pay

## 2021-03-19 LAB — URINE CULTURE
MICRO NUMBER:: 11928236
SPECIMEN QUALITY:: ADEQUATE

## 2021-03-19 NOTE — Telephone Encounter (Signed)
-----   Message from Elinor Parkinson, [place] sent at 03/18/2021  7:22 AM EDT ----- Would you let her know that I want an OVER READ on this and it will take a few weeks before we know for sure the problem.  Current findings are non specific.

## 2021-03-19 NOTE — Telephone Encounter (Signed)
The pt has given a call back and I have relayed the message again. She stated understanding.

## 2021-03-19 NOTE — Telephone Encounter (Addendum)
Request for MRI disc and report has been faxed to [facility]  Patient has been notified of delay via voice mail

## 2021-03-19 NOTE — Telephone Encounter (Signed)
I have called pt and left message on her answering machines. I have relayed the message below, but if she has any additional question then to give [place] a call back.

## 2021-03-19 NOTE — Telephone Encounter (Signed)
I have called pt and informed her of the lab results and she stated understanding.  Also pt mentioned that the cream/ointment that was given to her burns upon application. Is that normal or do we have to send in something else? Pt did say that there were some open wounds near the naval area.

## 2021-03-23 ENCOUNTER — Ambulatory Visit (HOSPITAL_BASED_OUTPATIENT_CLINIC_OR_DEPARTMENT_OTHER)
Admission: RE | Admit: 2021-03-23 | Discharge: 2021-03-23 | Disposition: A | Discharge status: Home / Self Care | Payer: 59 | Source: Ambulatory Visit | Attending: Family | Admitting: Family

## 2021-03-23 ENCOUNTER — Other Ambulatory Visit: Payer: Self-pay

## 2021-03-23 ENCOUNTER — Encounter (HOSPITAL_BASED_OUTPATIENT_CLINIC_OR_DEPARTMENT_OTHER): Payer: Self-pay

## 2021-03-23 ENCOUNTER — Other Ambulatory Visit: Payer: Self-pay | Admitting: Family

## 2021-03-23 DIAGNOSIS — Z1231 Encounter for screening mammogram for malignant neoplasm of breast: Secondary | ICD-10-CM

## 2021-03-23 IMAGING — MG DIGITAL SCREENING BREAST BILAT IMPLANT W/ TOMO W/ CAD
8 of 12 series · 8 of 28 positions shown · non-contrast
Comparison: Previous exam(s).

CLINICAL DATA: Screening.

EXAM:
DIGITAL SCREENING BILATERAL MAMMOGRAM WITH IMPLANTS, CAD AND
TOMOSYNTHESIS
TECHNIQUE: Bilateral screening digital craniocaudal and mediolateral oblique
mammograms were obtained. Bilateral screening digital breast
tomosynthesis was performed. The images were evaluated with
computer-aided detection. Standard and/or implant displaced views
were performed.

[L CC]
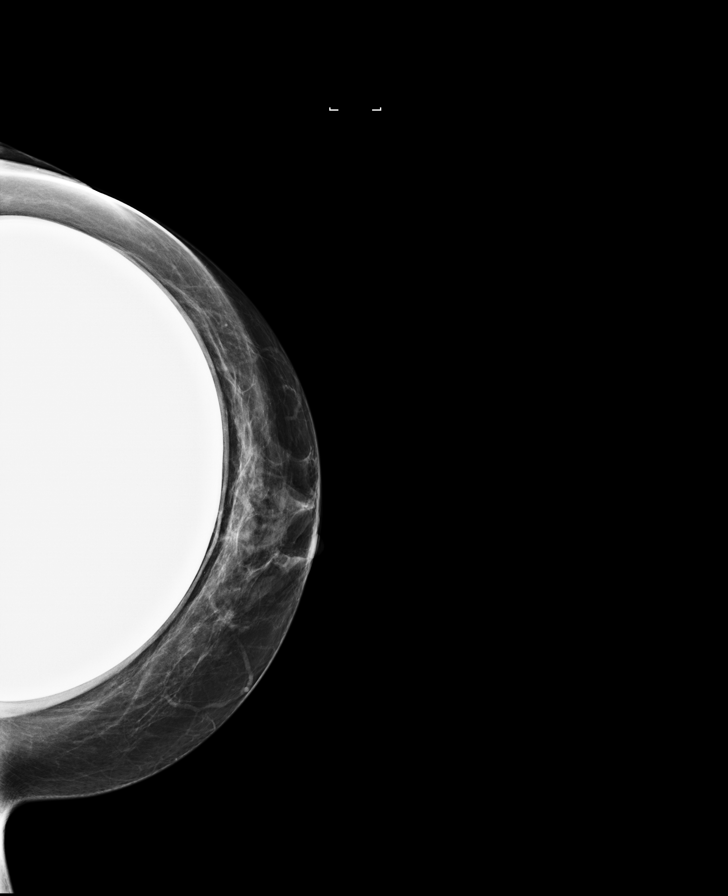

[L MLO]
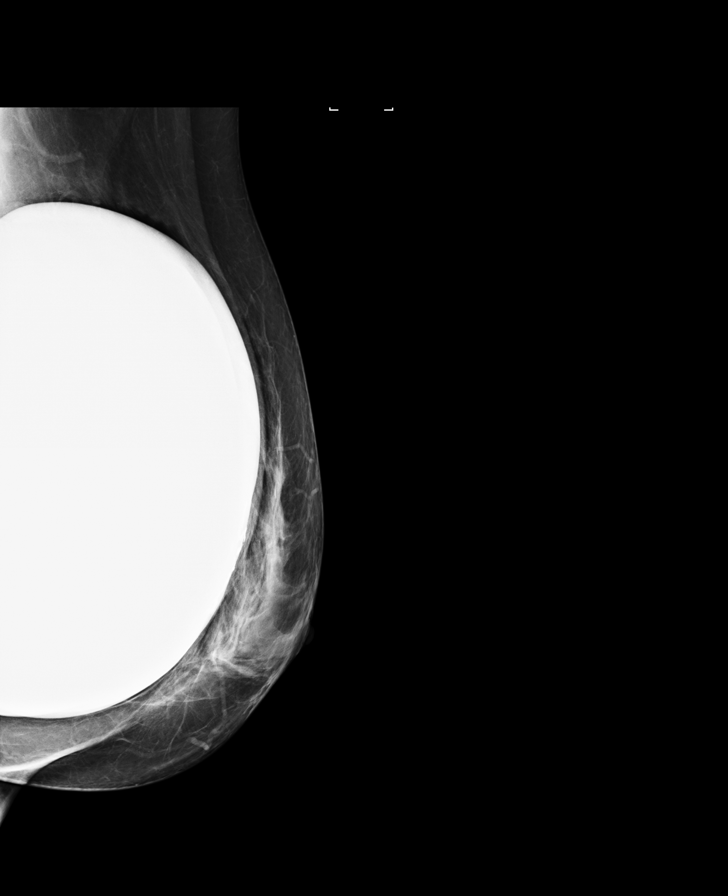

[R MLO]
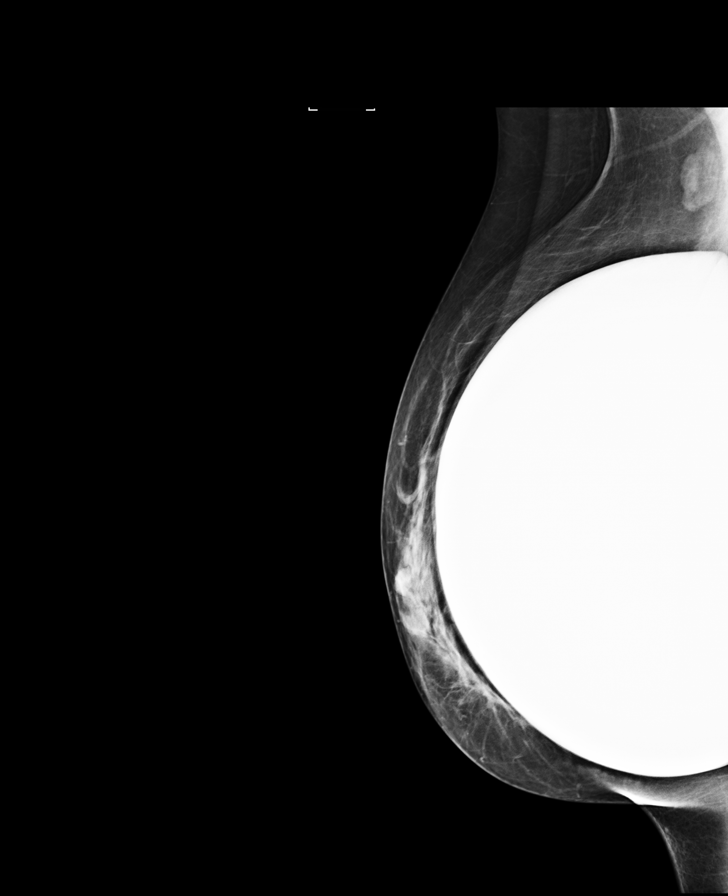

[R CC]
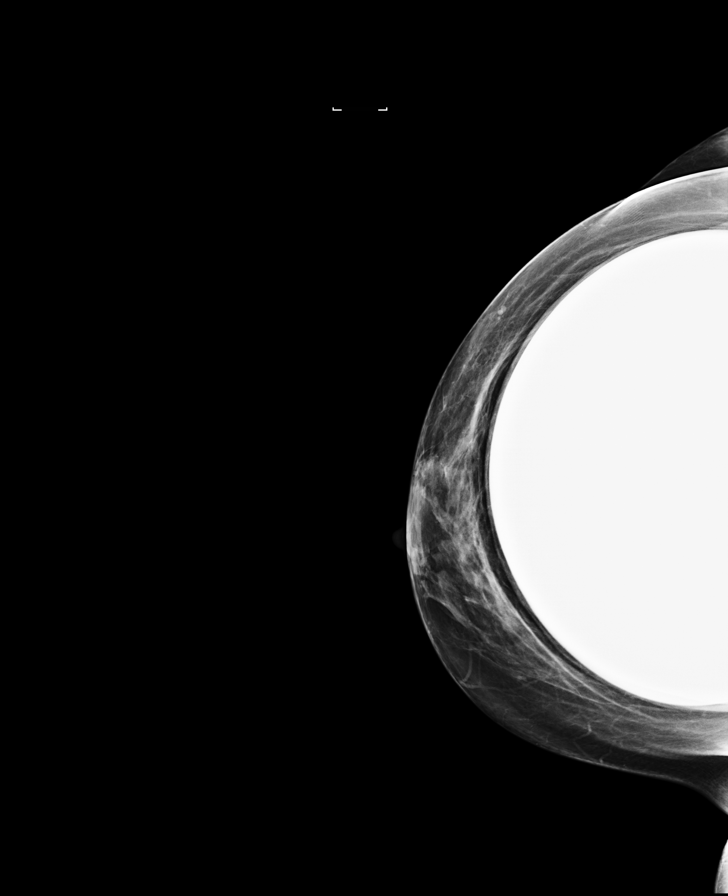

[L MLO synth-2D]
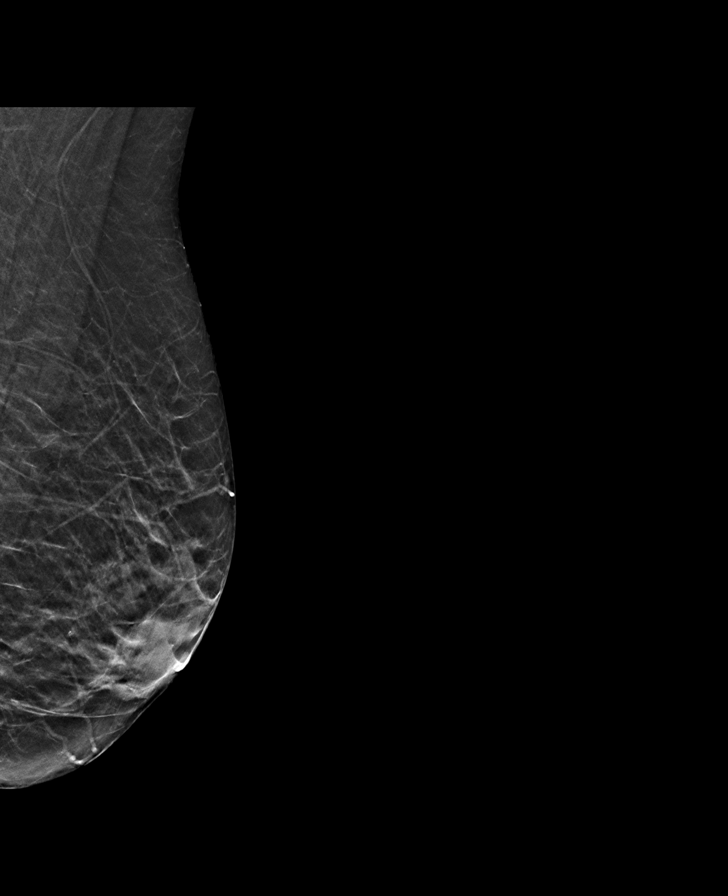

[R MLO synth-2D]
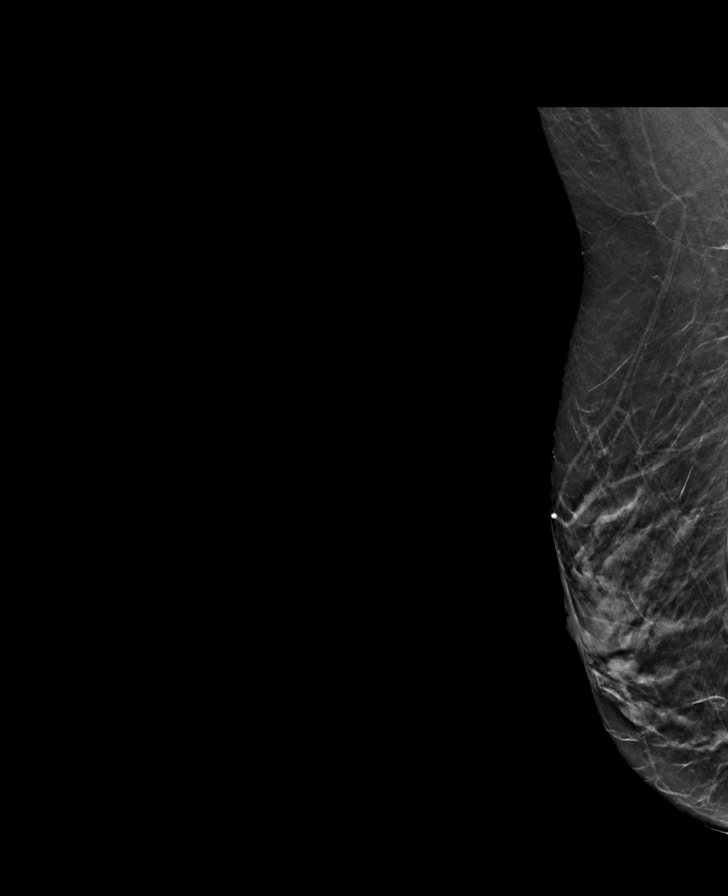

[R CC synth-2D]
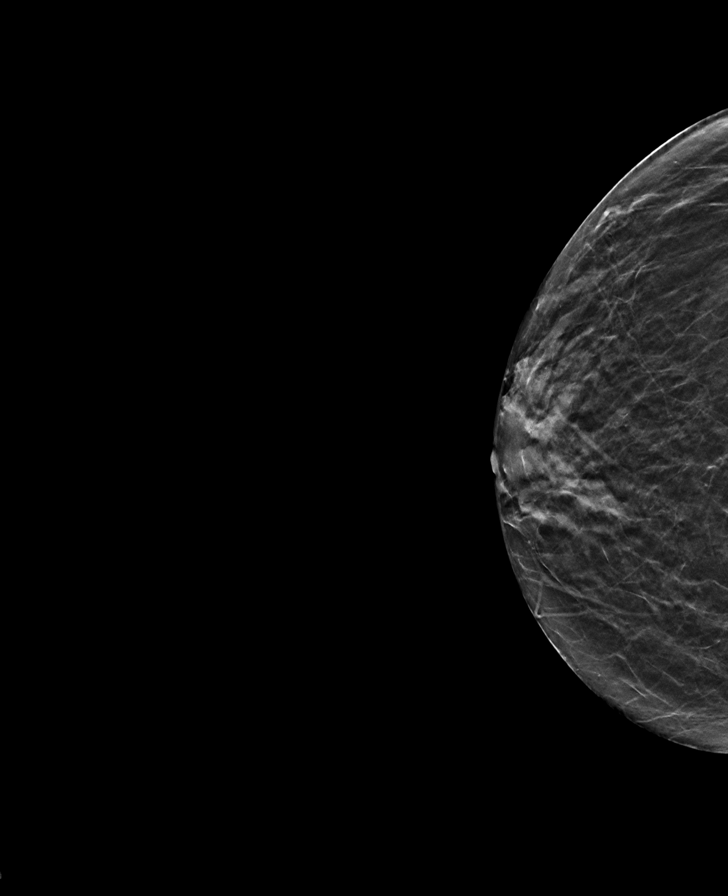

[L CC synth-2D]
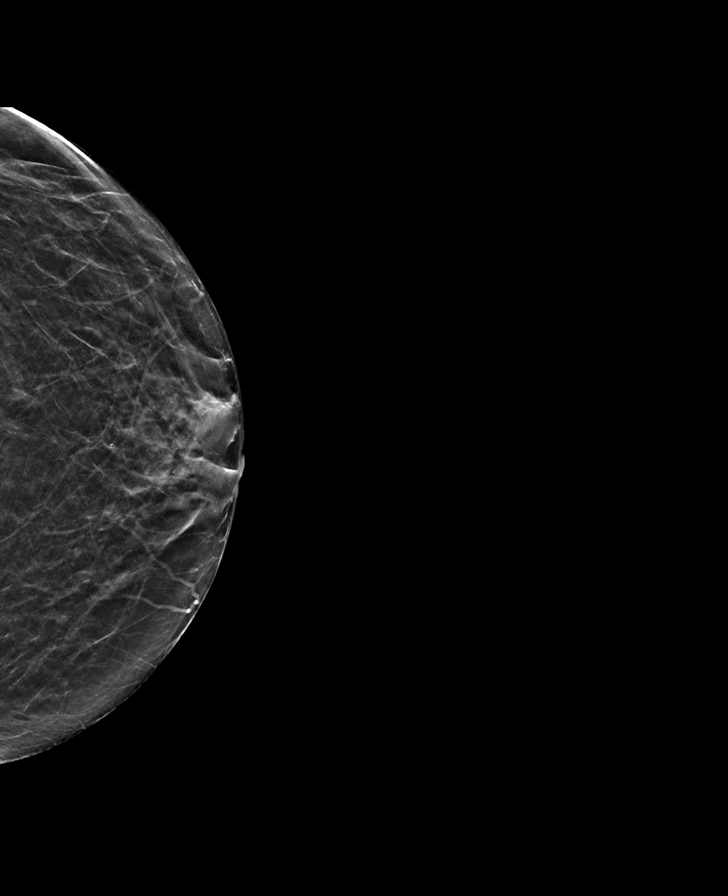

[8 of 28 positions shown; findings below may reference images not displayed]

ACR Breast Density Category b: There are scattered areas of
fibroglandular density.
FINDINGS: The patient has retropectoral implants. There are no findings
suspicious for malignancy.
IMPRESSION: No mammographic evidence of malignancy. A result letter of this
screening mammogram will be mailed directly to the patient.

RECOMMENDATION:
Screening mammogram in one year. (Code:[WS])

BI-RADS CATEGORY  1:  Negative.

## 2021-03-23 NOTE — Telephone Encounter (Signed)
MRI disc requested from [facility] today

## 2021-03-24 ENCOUNTER — Telehealth: Payer: Self-pay | Admitting: Podiatry

## 2021-03-24 NOTE — Telephone Encounter (Signed)
Laura Fields calling to request calling back regarding the CD

## 2021-03-24 NOTE — Telephone Encounter (Signed)
I've already requested the disc yesterday. Messaged Victorino Dike this morning!

## 2021-03-24 NOTE — Telephone Encounter (Signed)
Could someone please take care of this as not to delay patient care?  Thank you.

## 2021-03-24 NOTE — Telephone Encounter (Signed)
Victorino Dike from [facility] called in requesting to speak with Morrie Sheldon regarding CD that was requested yesterday at the [place] office, Victorino Dike was calling back to see if the request was needed and requested return call [phone], Please Advise

## 2021-03-25 NOTE — Telephone Encounter (Signed)
[facility] MRI disc received and sent out to [employer] today

## 2021-04-07 NOTE — Telephone Encounter (Signed)
Patient's overread report came in today and she will be notified for an appointment.

## 2021-04-08 ENCOUNTER — Encounter: Payer: Self-pay | Admitting: *Deleted

## 2021-04-12 ENCOUNTER — Telehealth: Payer: Self-pay

## 2021-04-12 NOTE — Telephone Encounter (Signed)
LVM FOR PT TO CALL BACK TO SCHEDULE MRI FOLLOW UP

## 2021-04-14 ENCOUNTER — Other Ambulatory Visit: Payer: Self-pay | Admitting: Family Medicine

## 2021-04-29 ENCOUNTER — Ambulatory Visit (INDEPENDENT_AMBULATORY_CARE_PROVIDER_SITE_OTHER): Payer: 59 | Admitting: Podiatry

## 2021-04-29 ENCOUNTER — Encounter: Payer: Self-pay | Admitting: Podiatry

## 2021-04-29 ENCOUNTER — Other Ambulatory Visit: Payer: Self-pay

## 2021-04-29 DIAGNOSIS — M7661 Achilles tendinitis, right leg: Secondary | ICD-10-CM | POA: Diagnosis not present

## 2021-04-29 DIAGNOSIS — M7731 Calcaneal spur, right foot: Secondary | ICD-10-CM

## 2021-04-29 DIAGNOSIS — M722 Plantar fascial fibromatosis: Secondary | ICD-10-CM

## 2021-04-30 NOTE — Progress Notes (Signed)
She presents today states that is hurting so bad it has not gotten any better as she refers to her Achilles tendinitis in her right foot and ankle.  He states it is affecting her ability to perform her daily activities as well as maintain her general health.  Objective: Vital signs are stable alert and oriented x3.  Pulses are palpable severe pain on palpation of the Achilles right as well as tenderness on the distal aspect of a flaccid gastroc muscle.  Radiographs are unchanged MRI states that there is some interstitial tearing and a small spur.  Assessment: Achilles tendinitis chronic in nature retrocalcaneal heel spur.  Plan: Discussed etiology pathology conservative versus surgical therapies.  We did consented her today for a gastroc recession and Achilles tenolysis retrocalcaneal heel spur resection and a cast on the right foot.  We did discuss in great detail the possible side effects which may include but are not limited to postop pain bleeding swelling infection recurrence need for further surgery overcorrection under correction loss of digit loss of limb loss of life.  We also discussed the possible need for rehabilitation she understands this is amendable to it we did discuss the possible need for him to come back to the operating room she understands that we also discussed and provided her with information regarding the surgery center and the anesthesia group.  I will follow-up with Jovonda sometime after August and should she have questions or concerns she will notify us immediately.

## 2021-05-15 ENCOUNTER — Other Ambulatory Visit: Payer: Self-pay | Admitting: Family Medicine

## 2021-05-18 ENCOUNTER — Ambulatory Visit: Payer: 59 | Admitting: Dermatology

## 2021-05-30 ENCOUNTER — Other Ambulatory Visit: Payer: Self-pay | Admitting: Family Medicine

## 2021-06-07 ENCOUNTER — Telehealth: Payer: Self-pay | Admitting: Podiatry

## 2021-06-07 NOTE — Telephone Encounter (Signed)
Pt called stating her job went back into the office last week. She stated she tried to work through it but she is experiencing foot swelling and pain. She is requesting to be taken out of work and on bed rest for inflammation. Please advise.

## 2021-06-08 ENCOUNTER — Encounter: Payer: Self-pay | Admitting: *Deleted

## 2021-06-08 ENCOUNTER — Telehealth: Payer: Self-pay | Admitting: *Deleted

## 2021-06-08 NOTE — Telephone Encounter (Signed)
Out of work note approved and written for patient.mailed 06/09/21.

## 2021-06-08 NOTE — Telephone Encounter (Signed)
Patient is requesting to be taken out of work,is unable to put pressure on foot, swelling, cannot take extra walking. She has not been to work since Friday. Please advise.

## 2021-06-09 ENCOUNTER — Telehealth: Payer: Self-pay | Admitting: Urology

## 2021-06-09 NOTE — Telephone Encounter (Signed)
DOS - 07/02/21  REPAIR TENDON ACHILLES RIGHT --- [id number] GASTROCNEMIUS RECESS RIGHT --- [id number] CALCANEAL OSTECTOMY RIGHT --- [id number]   [facility] EFFECTIVE DATE - 10/25/19   RECEIVED FAX FROM [facility] STATING THAT CPT CODES 31497, [phone] AND [phone] NO PRIOR AUTH IS REQUIRED.

## 2021-06-10 ENCOUNTER — Telehealth: Payer: Self-pay | Admitting: Podiatry

## 2021-06-10 DIAGNOSIS — M79676 Pain in unspecified toe(s): Secondary | ICD-10-CM

## 2021-06-10 NOTE — Telephone Encounter (Signed)
I am completing paperwork for Laura Fields, I was just wondering how long she is expected to be out of work?

## 2021-06-11 ENCOUNTER — Emergency Department (HOSPITAL_COMMUNITY): Payer: 59

## 2021-06-11 ENCOUNTER — Emergency Department (HOSPITAL_COMMUNITY)
Admission: EM | Admit: 2021-06-11 | Discharge: 2021-06-11 | Disposition: A | Discharge status: Home / Self Care | Payer: 59 | Attending: Emergency Medicine | Admitting: Emergency Medicine

## 2021-06-11 ENCOUNTER — Encounter (HOSPITAL_COMMUNITY): Payer: Self-pay | Admitting: Emergency Medicine

## 2021-06-11 DIAGNOSIS — R197 Diarrhea, unspecified: Secondary | ICD-10-CM | POA: Diagnosis not present

## 2021-06-11 DIAGNOSIS — Z79899 Other long term (current) drug therapy: Secondary | ICD-10-CM | POA: Diagnosis not present

## 2021-06-11 DIAGNOSIS — R110 Nausea: Secondary | ICD-10-CM | POA: Insufficient documentation

## 2021-06-11 DIAGNOSIS — Z7951 Long term (current) use of inhaled steroids: Secondary | ICD-10-CM | POA: Diagnosis not present

## 2021-06-11 DIAGNOSIS — N898 Other specified noninflammatory disorders of vagina: Secondary | ICD-10-CM | POA: Diagnosis not present

## 2021-06-11 DIAGNOSIS — J45909 Unspecified asthma, uncomplicated: Secondary | ICD-10-CM | POA: Insufficient documentation

## 2021-06-11 DIAGNOSIS — R1084 Generalized abdominal pain: Secondary | ICD-10-CM | POA: Diagnosis not present

## 2021-06-11 LAB — COMPREHENSIVE METABOLIC PANEL
ALT: 11 U/L (ref 0–44)
AST: 21 U/L (ref 15–41)
Albumin: 3.9 g/dL (ref 3.5–5.0)
Alkaline Phosphatase: 69 U/L (ref 38–126)
Anion gap: 11 (ref 5–15)
BUN: 5 mg/dL — ABNORMAL LOW (ref 6–20)
CO2: 19 mmol/L — ABNORMAL LOW (ref 22–32)
Calcium: 8.7 mg/dL — ABNORMAL LOW (ref 8.9–10.3)
Chloride: 99 mmol/L (ref 98–111)
Creatinine, Ser: 0.8 mg/dL (ref 0.44–1.00)
GFR, Estimated: >60 mL/min (ref >60)
Glucose, Bld: 122 mg/dL — ABNORMAL HIGH (ref 70–99)
Potassium: 3.3 mmol/L — ABNORMAL LOW (ref 3.5–5.1)
Sodium: 129 mmol/L — ABNORMAL LOW (ref 135–145)
Total Bilirubin: 0.8 mg/dL (ref 0.3–1.2)
Total Protein: 7.2 g/dL (ref 6.5–8.1)

## 2021-06-11 LAB — CBC WITH DIFFERENTIAL/PLATELET
Abs Immature Granulocytes: 0.03 10*3/uL (ref 0.00–0.07)
Basophils Absolute: 0 10*3/uL (ref 0.0–0.1)
Basophils Relative: 0 %
Eosinophils Absolute: 0 10*3/uL (ref 0.0–0.5)
Eosinophils Relative: 0 %
HCT: 40.9 % (ref 36.0–46.0)
Hemoglobin: 13.1 g/dL (ref 12.0–15.0)
Immature Granulocytes: 0 %
Lymphocytes Relative: 8 %
Lymphs Abs: 0.7 10*3/uL (ref 0.7–4.0)
MCH: 30.4 pg (ref 26.0–34.0)
MCHC: 32 g/dL (ref 30.0–36.0)
MCV: 94.9 fL (ref 80.0–100.0)
Monocytes Absolute: 0.2 10*3/uL (ref 0.1–1.0)
Monocytes Relative: 2 %
Neutro Abs: 7.5 10*3/uL (ref 1.7–7.7)
Neutrophils Relative %: 90 %
Platelets: 408 10*3/uL — ABNORMAL HIGH (ref 150–400)
RBC: 4.31 MIL/uL (ref 3.87–5.11)
RDW: 13.2 % (ref 11.5–15.5)
WBC: 8.4 10*3/uL (ref 4.0–10.5)
nRBC: 0 % (ref 0.0–0.2)

## 2021-06-11 LAB — URINALYSIS, ROUTINE W REFLEX MICROSCOPIC
Bilirubin Urine: NEGATIVE
Glucose, UA: NEGATIVE mg/dL
Ketones, ur: 20 mg/dL — AB
Leukocytes,Ua: NEGATIVE
Nitrite: NEGATIVE
Protein, ur: NEGATIVE mg/dL
Specific Gravity, Urine: 1.014 (ref 1.005–1.030)
pH: 9 — ABNORMAL HIGH (ref 5.0–8.0)

## 2021-06-11 LAB — I-STAT BETA HCG BLOOD, ED (MC, WL, AP ONLY): I-stat hCG, quantitative: <5 m[IU]/mL (ref <5)

## 2021-06-11 LAB — RAPID URINE DRUG SCREEN, HOSP PERFORMED
Amphetamines: NOT DETECTED
Barbiturates: NOT DETECTED
Benzodiazepines: NOT DETECTED
Cocaine: NOT DETECTED
Opiates: NOT DETECTED
Tetrahydrocannabinol: POSITIVE — AB

## 2021-06-11 LAB — LIPASE, BLOOD: Lipase: 42 U/L (ref 11–51)

## 2021-06-11 IMAGING — CT CT ABD-PELV W/ CM
2 of 4 series · 16 of 46 positions shown, 18 images · IV contrast (APPLIED)
Comparison: None.

CLINICAL DATA: Acute nonlocalized abdominal pain. History of
gastric bypass.

EXAM:
CT ABDOMEN AND PELVIS WITH CONTRAST
TECHNIQUE: Multidetector CT imaging of the abdomen and pelvis was performed
using the standard protocol following bolus administration of
intravenous contrast.
CONTRAST:  75mL OMNIPAQUE IOHEXOL 350 MG/ML SOLN

[Series 3: abdomen 5.0 · axial · 0.86mm/px · z∈[+869,+1309]mm · 13 of 100 slices shown, 15 images]
[im 6/100  soft-tissue]
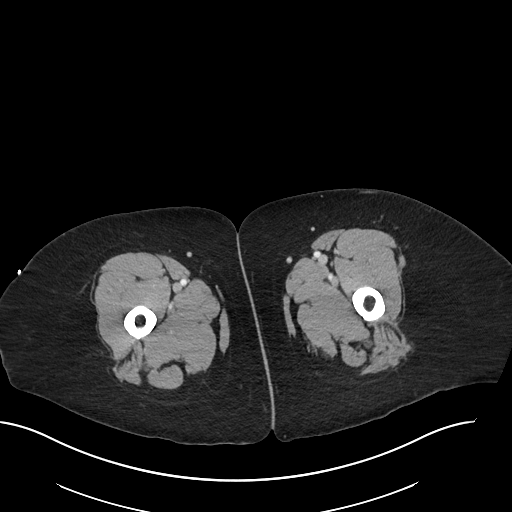
[im 6/100  bone]
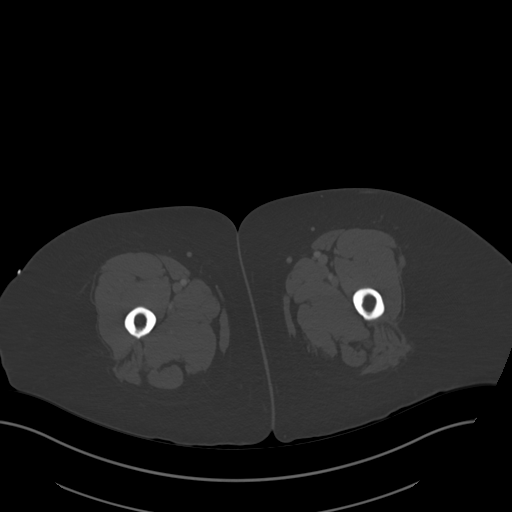
[im 16/100  soft-tissue]
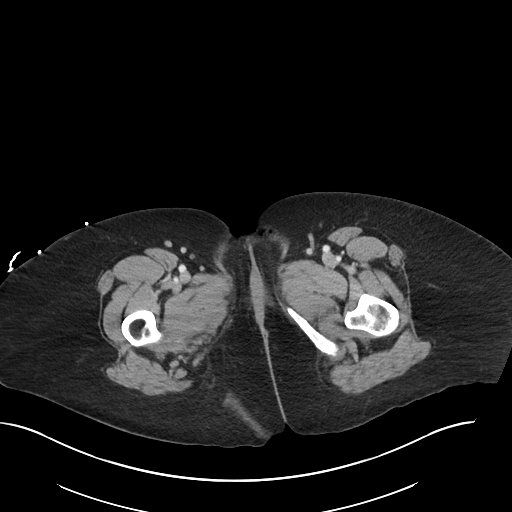
[im 21/100  soft-tissue]
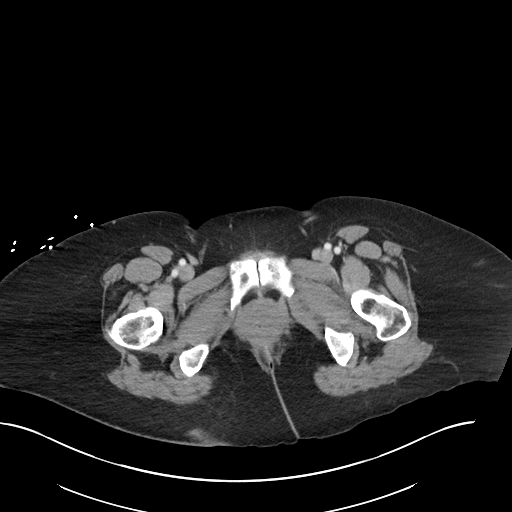
[im 27/100  soft-tissue]
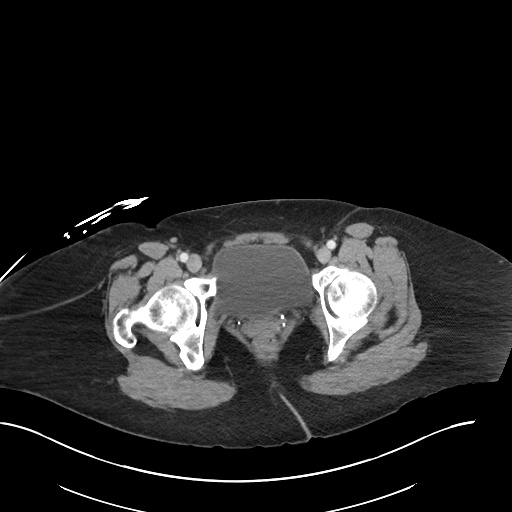
[im 37/100  soft-tissue]
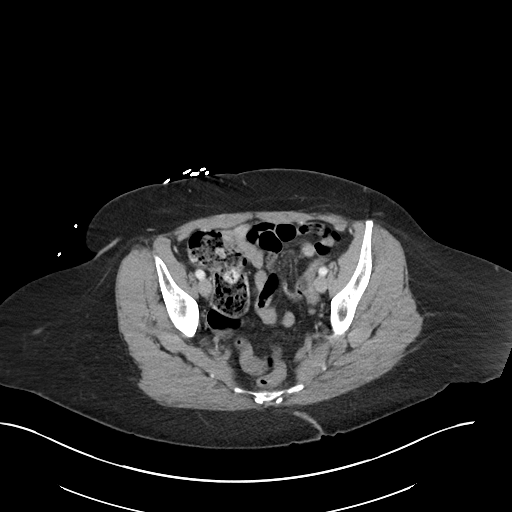
[im 42/100  soft-tissue]
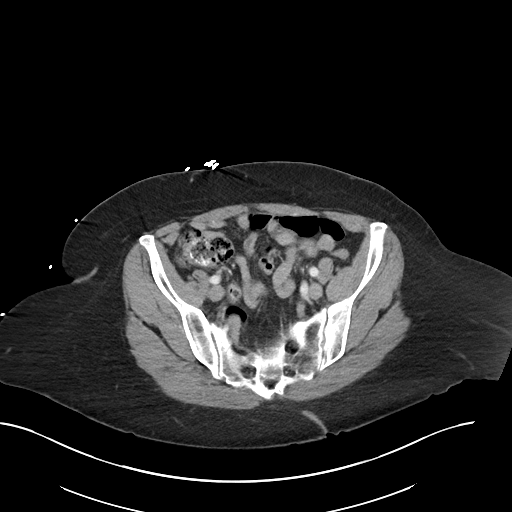
[im 53/100  soft-tissue]
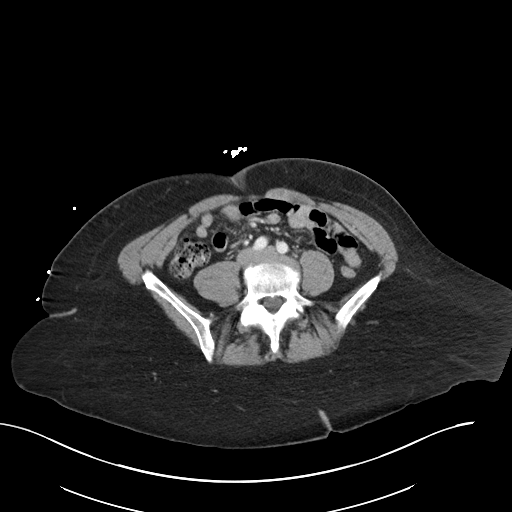
[im 58/100  soft-tissue]
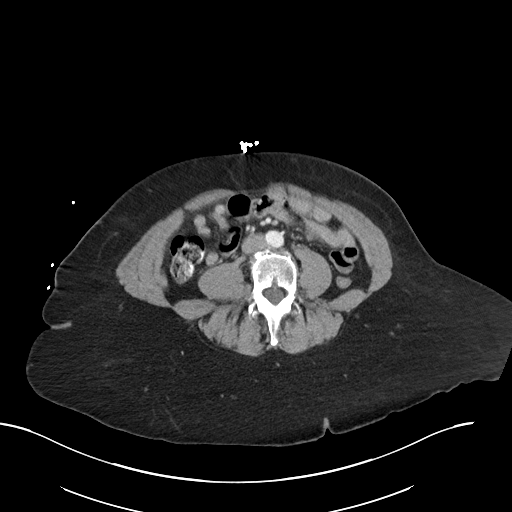
[im 63/100  soft-tissue]
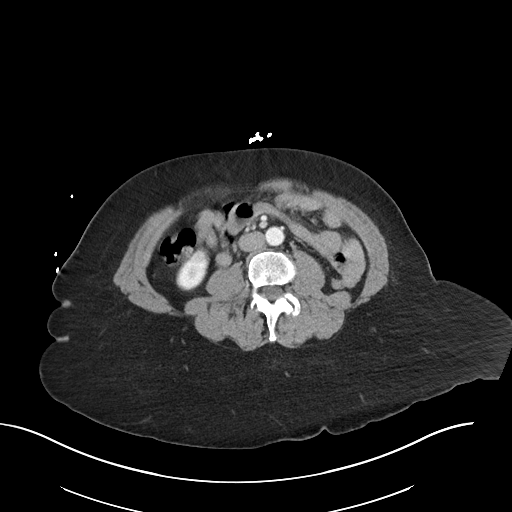
[im 63/100  bone]
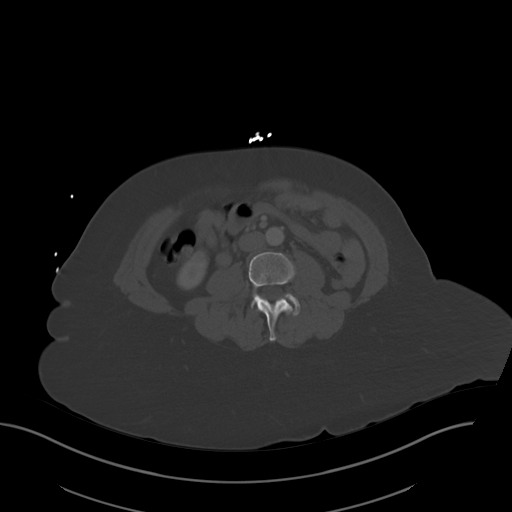
[im 73/100  soft-tissue]
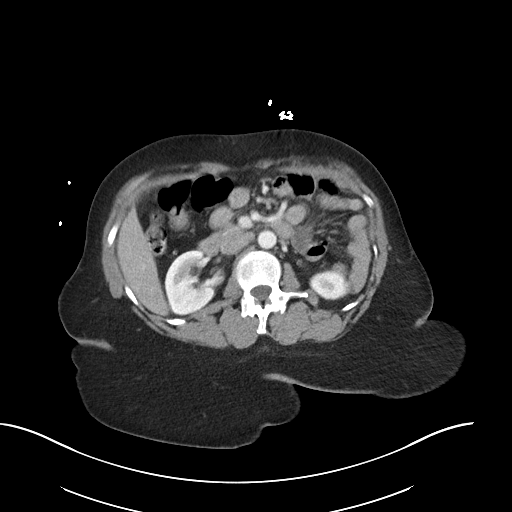
[im 79/100  soft-tissue]
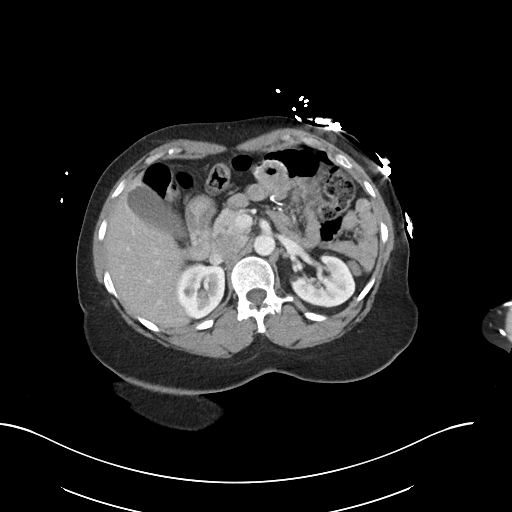
[im 84/100  soft-tissue]
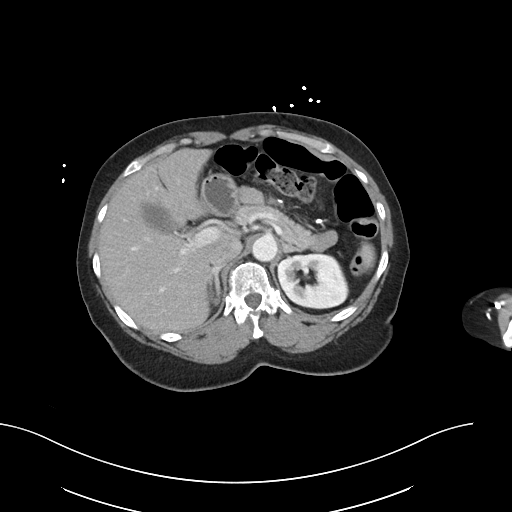
[im 94/100  soft-tissue]
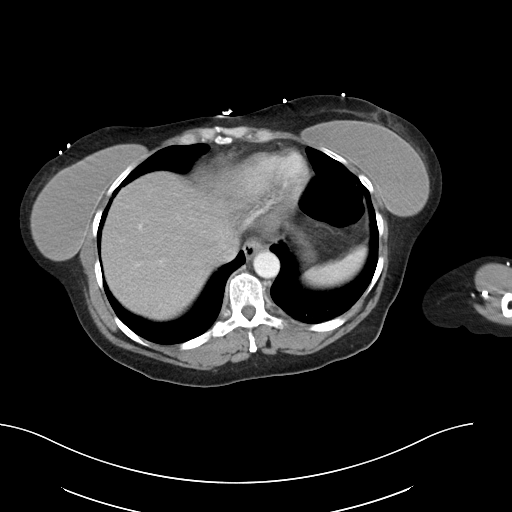

[Series 6: abdomen 3.0 mpr cor · coronal · 0.71mm/px · 3 of 102 slices shown]
[im 34/102  soft-tissue]
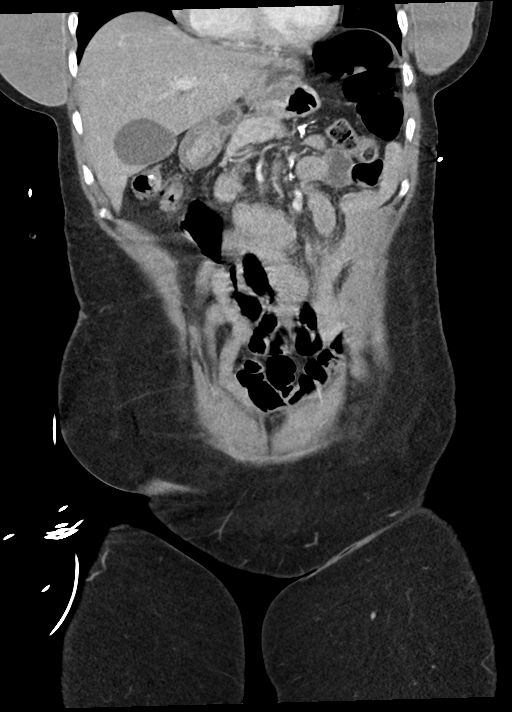
[im 45/102  soft-tissue]
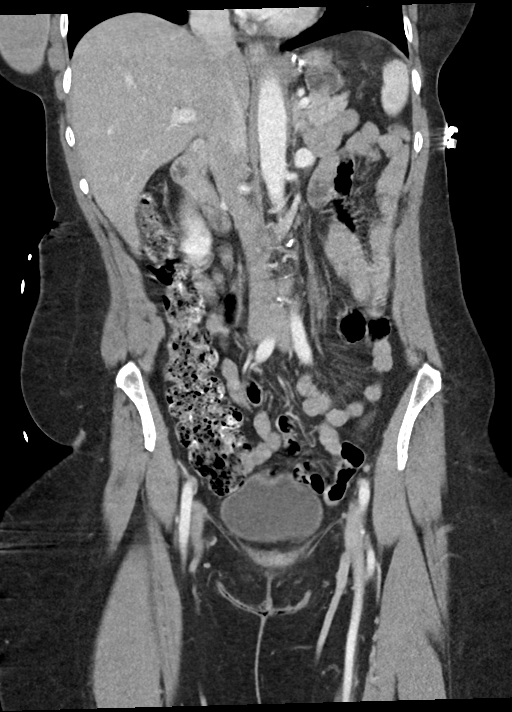
[im 57/102  soft-tissue]
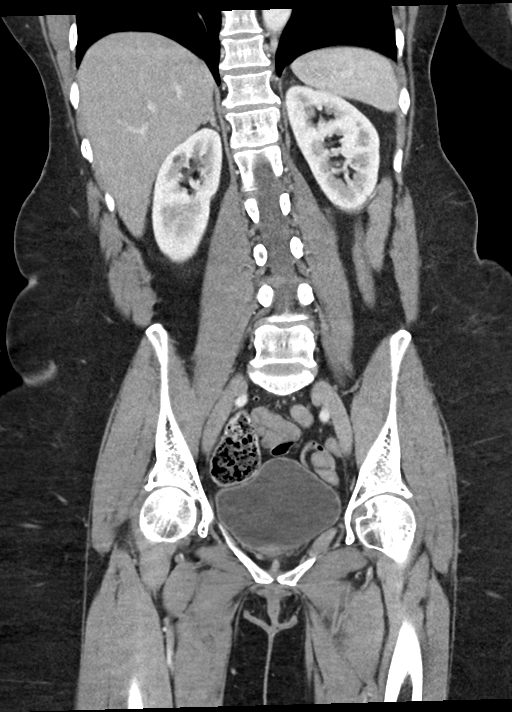

[16 of 46 positions shown; findings below may reference images not displayed]

FINDINGS: Lower chest: Bilateral breast implants. Mild dependent atelectasis
in the lung bases.

Hepatobiliary: Mild diffuse fatty infiltration of the liver. No
focal lesions. Gallbladder and bile ducts are unremarkable.

Pancreas: Unremarkable. No pancreatic ductal dilatation or
surrounding inflammatory changes.

Spleen: Normal in size without focal abnormality.

Adrenals/Urinary Tract: Adrenal glands are unremarkable. Kidneys are
normal, without renal calculi, focal lesion, or hydronephrosis.
Bladder is unremarkable.

Stomach/Bowel: Postoperative changes consistent with gastric bypass.
Stomach, small bowel, and colon are not abnormally distended. No
wall thickening or inflammatory changes are appreciated. The
appendix is normal.

Vascular/Lymphatic: No significant vascular findings are present. No
enlarged abdominal or pelvic lymph nodes.

Reproductive: Status post hysterectomy. No adnexal masses.

Other: No free air or free fluid in the abdomen. Abdominal wall
musculature appears intact.

Musculoskeletal: Degenerative changes in the spine. No destructive
bone lesions.
IMPRESSION: No acute process demonstrated in the abdomen or pelvis. No evidence
of bowel obstruction or inflammation.

## 2021-06-11 MED ORDER — LACTATED RINGERS IV BOLUS
1000.0000 mL | Freq: Once | INTRAVENOUS | Status: AC
  Administered 2021-06-11: 1000 mL via INTRAVENOUS

## 2021-06-11 MED ORDER — IOHEXOL 350 MG/ML SOLN
75.0000 mL | Freq: Once | INTRAVENOUS | Status: AC | PRN
  Administered 2021-06-11: 75 mL via INTRAVENOUS

## 2021-06-11 MED ORDER — ONDANSETRON HCL 4 MG/2ML IJ SOLN
4.0000 mg | Freq: Once | INTRAMUSCULAR | Status: AC
  Administered 2021-06-11: 4 mg via INTRAVENOUS
  Filled 2021-06-11: qty 2

## 2021-06-11 MED ORDER — MORPHINE SULFATE (PF) 4 MG/ML IV SOLN
4.0000 mg | Freq: Once | INTRAVENOUS | Status: AC
  Administered 2021-06-11: 4 mg via INTRAVENOUS
  Filled 2021-06-11: qty 1

## 2021-06-11 NOTE — ED Notes (Signed)
Ambulated to bathroom without assistance with steady gait

## 2021-06-11 NOTE — ED Triage Notes (Signed)
Patient BIB [facility] for abdominal pain, nausea, and diarrhea since last night at 1900. Given 4mg  zofran PTA. History of gastric bypass prior to 2000.

## 2021-06-11 NOTE — ED Notes (Signed)
Patient transported to CT 

## 2021-06-11 NOTE — Discharge Instructions (Addendum)
Please follow-up with your PCP. Take tylenol as needed for pain.

## 2021-06-11 NOTE — ED Provider Notes (Signed)
Emergency Medicine Provider Triage Evaluation Note  Laura Fields , a 60 y.o. female  was evaluated in triage.  Pt complains of abd pain, nausea and diarrhea beginning at 1900 last night and persistent since.  Diarrhea is loose stool per pt. NO blood/melena.  Vomiting x0. Nausea has been persistent.  No fever, recent travel or blood in stool.   Given zofran by EMS.   Review of Systems  Positive: Nausea, diarrhea Negative: fever  Physical Exam  BP (!) 171/91 (BP Location: Right Arm)   Pulse 67   Temp 98.3 F (36.8 C)   Resp (!) 22   SpO2 100%  Gen:   Awake, no distress   Resp:  Normal effort  MSK:   Moves extremities without difficulty  Other:  No abd TTP  Medical Decision Making  Medically screening exam initiated at 1:29 PM.  Appropriate orders placed.  Laura Fields was informed that the remainder of the evaluation will be completed by another provider, this initial triage assessment does not replace that evaluation, and the importance of remaining in the ED until their evaluation is complete.     [name], [place] 06/11/21 1335    Cheryll Cockayne, MD 06/11/21 1445

## 2021-06-11 NOTE — ED Provider Notes (Signed)
[facility] EMERGENCY DEPARTMENT Provider Note   CSN: [id number] Arrival date & time: 06/11/21  1327     History Chief Complaint  Patient presents with   Abdominal Pain    Laura Fields is a 60 y.o. female.  Patient is a 60 year old female with a past medical history of gastric bypass surgery, asthma that is presenting for abdominal pain that has been going on for the last 24 hours.  Patient complains of abdominal pain located in the middle of her abdomen that is diffuse throughout her abdomen.  She denies any radiation of the pain.  She is complaining of nausea and diarrhea.  She denies any blood in her stool.  Her last bowel movement was today at 11 AM.  She denies any episodes of vomiting.  She is still passing gas.  She states that her abdominal pain is a 10 out of 10.  She has a history of gastric bypass surgery and abdominal hysterectomy.   She denies any recent fevers, chills, headache, neck stiffness, chest pain, shortness of breath, numbness or weakness.  Abdominal Pain Pain location:  Generalized Pain quality: aching   Pain radiates to:  Does not radiate Duration:  1 day Associated symptoms: nausea   Associated symptoms: no anorexia, no belching, no chest pain, no chills, no constipation, no cough, no diarrhea, no dysuria, no fatigue, no fever, no hematemesis, no hematochezia, no hematuria, no melena, no shortness of breath, no sore throat, no vaginal bleeding, no vaginal discharge and no vomiting       Past Medical History:  Diagnosis Date   Asthma    Intrinsic atopic dermatitis 12/01/2020    Patient Active Problem List   Diagnosis Date Noted   Intrinsic atopic dermatitis 12/01/2020   Dysfunction of both eustachian tubes 12/01/2020   Pruritic rash 02/12/2020   Allergic conjunctivitis of both eyes 02/12/2020   Seasonal allergic rhinitis with a nonallergic component 12/25/2019   Allergic conjunctivitis 12/25/2019   Moderate persistent asthma  12/25/2019    Past Surgical History:  Procedure Laterality Date   ABDOMINAL HYSTERECTOMY     AUGMENTATION MAMMAPLASTY Bilateral 1999   Saline/ under muscle   back nerve     block   GASTRIC BYPASS     KNEE ARTHROSCOPY     mass removable     on thumb   STOMACH SURGERY       OB History   No obstetric history on file.     Family History  Problem Relation Age of Onset   Hypertension Mother    Allergic rhinitis Neg Hx    Angioedema Neg Hx    Asthma Neg Hx    Eczema Neg Hx    Immunodeficiency Neg Hx    Urticaria Neg Hx     Social History   Tobacco Use   Smoking status: Never    Passive exposure: Yes   Smokeless tobacco: Never  Vaping Use   Vaping Use: Never used  Substance Use Topics   Alcohol use: Yes   Drug use: Never    Home Medications Prior to Admission medications   Medication Sig Start Date End Date Taking? Authorizing Provider  albuterol (VENTOLIN HFA) 108 (90 Base) MCG/ACT inhaler INHALE 2 PUFFS INTO THE LUNGS EVERY 4 HOURS AS NEEDED FOR WHEEZE OR FOR SHORTNESS OF BREATH 04/14/21   Ambs, Norvel Richards, FNP  AMBULATORY NON FORMULARY MEDICATION Apply 1 application topically at bedtime. Medication Name: Compounded Hydroquinone 8%, Tretinoin 0.025%, Kojic Acid 1%, Niacinamide 4%,  Fluocinolone 0.025% Cream ([employer]) 02/26/20   Janalyn Harderafeen, Stuart, MD  baclofen (LIORESAL) 10 MG tablet Take 10 mg by mouth 3 (three) times daily.    [provider]  budesonide-formoterol (SYMBICORT) 160-4.5 MCG/ACT inhaler INHALE 2 PUFFS INTO THE LUNGS TWICE A DAY 06/01/21   Ambs, Norvel RichardsAnne M, FNP  Carbinoxamine Maleate 4 MG TABS Take 1 tablet every 8 hours for runny nose. 12/01/20   Hetty BlendAmbs, Anne M, FNP  cyclobenzaprine (FLEXERIL) 5 MG tablet Take 5 mg by mouth 3 (three) times daily as needed for muscle spasms.    [provider]  desonide (DESOWEN) 0.05 % ointment Apply 1 application topically 2 (two) times daily as needed. Apply twice daily to red itchy areas to the face. 12/01/20    Hetty BlendAmbs, Anne M, FNP  DULoxetine (CYMBALTA) 60 MG capsule Take 60 mg by mouth daily. 04/07/20   [provider]  Epinastine HCl 0.05 % ophthalmic solution Apply to eye as needed. 12/31/19   [provider]  flunisolide (NASALIDE) 25 MCG/ACT (0.025%) SOLN 2 SPRAYS PER NOSTRIL 2-3 TIMES PER DAY AS NEEDED FOR STUFFY NOSE. 05/17/21   Ambs, Norvel RichardsAnne M, FNP  hydrOXYzine (ATARAX/VISTARIL) 25 MG tablet Take 1 tablet (25 mg total) by mouth 3 (three) times daily as needed for itching. 02/12/20   Ellamae SiaKim, Yoon M, DO  meloxicam (MOBIC) 15 MG tablet Take 15 mg by mouth daily.    [provider]  montelukast (SINGULAIR) 10 MG tablet TAKE 1 TABLET BY MOUTH EVERYDAY AT BEDTIME 12/01/20   Ambs, Norvel RichardsAnne M, FNP  nystatin-triamcinolone ointment (MYCOLOG) Apply 1 application topically 2 (two) times daily. 03/16/21   Olive BassMurray, Laura Woodruff, FNP  Olopatadine HCl (PATADAY) 0.2 % SOLN Place 1 drop into both eyes daily as needed. 12/25/19   Bobbitt, Heywood Ilesalph Carter, MD  Olopatadine HCl 0.6 % SOLN Place 2 sprays into both nostrils 2 (two) times daily as needed. 01/28/21   Hetty BlendAmbs, Anne M, FNP  pregabalin (LYRICA) 150 MG capsule Take 150 mg by mouth 2 (two) times daily.    [provider]  sulfamethoxazole-trimethoprim (BACTRIM DS) 800-160 MG tablet Take 1 tablet by mouth 2 (two) times daily. 03/16/21   Olive BassMurray, Laura Woodruff, FNP  Tiotropium Bromide Monohydrate (SPIRIVA RESPIMAT) 1.25 MCG/ACT AERS INHALE 2 PUFFS BY MOUTH INTO THE LUNGS DAILY 12/01/20   Hetty BlendAmbs, Anne M, FNP  Azelastine-Fluticasone [facility](DYMISTA) 567 [id number] 4113137-50 MCG/ACT SUSP 1 spray per nostril twice daily as needed 12/25/19 12/26/19  Bobbitt, Heywood Ilesalph Carter, MD    Allergies    Rocephin [ceftriaxone sodium in dextrose]  Review of Systems   Review of Systems  Constitutional:  Negative for chills, diaphoresis, fatigue and fever.  HENT:  Negative for congestion, dental problem, ear discharge, ear pain, facial swelling, hearing loss, nosebleeds, postnasal drip, rhinorrhea, sinus  pain, sneezing, sore throat and trouble swallowing.   Eyes:  Negative for pain and visual disturbance.  Respiratory:  Negative for cough, chest tightness, shortness of breath, wheezing and stridor.   Cardiovascular:  Negative for chest pain, palpitations and leg swelling.  Gastrointestinal:  Positive for abdominal pain and nausea. Negative for abdominal distention, anorexia, blood in stool, constipation, diarrhea, hematemesis, hematochezia, melena and vomiting.  Endocrine: Negative for polydipsia and polyuria.  Genitourinary:  Negative for difficulty urinating, dysuria, flank pain, frequency, hematuria, urgency, vaginal bleeding and vaginal discharge.  Musculoskeletal:  Negative for myalgias, neck pain and neck stiffness.  Skin:  Negative for rash and wound.  Allergic/Immunologic: Negative for environmental allergies and food allergies.  Neurological:  Negative for dizziness, seizures, syncope, facial asymmetry, speech difficulty, weakness, light-headedness, numbness and headaches.  Psychiatric/Behavioral:  Negative for agitation, behavioral problems and confusion.    Physical Exam Updated Vital Signs BP 129/68   Pulse (!) 51   Temp 99 F (37.2 C) (Oral)   Resp 18   Ht 5\' 7"  (1.702 m)   Wt 68 kg   SpO2 100%   BMI 23.49 kg/m   Physical Exam Vitals and nursing note reviewed.  Constitutional:      General: She is not in acute distress.    Appearance: Normal appearance. She is normal weight. She is not ill-appearing.  HENT:     Head: Normocephalic and atraumatic.     Right Ear: External ear normal.     Left Ear: External ear normal.     Nose: Nose normal. No congestion.     Mouth/Throat:     Mouth: Mucous membranes are moist.     Pharynx: Oropharynx is clear. No oropharyngeal exudate or posterior oropharyngeal erythema.  Eyes:     General: No visual field deficit.    Extraocular Movements: Extraocular movements intact.     Conjunctiva/sclera: Conjunctivae normal.     Pupils:  Pupils are equal, round, and reactive to light.  Neck:     Vascular: No carotid bruit.  Cardiovascular:     Rate and Rhythm: Normal rate and regular rhythm.     Pulses: Normal pulses.     Heart sounds: Normal heart sounds. No murmur heard. Pulmonary:     Effort: Pulmonary effort is normal. No respiratory distress.     Breath sounds: Normal breath sounds. No stridor. No wheezing, rhonchi or rales.  Chest:     Chest wall: No tenderness.  Abdominal:     General: Bowel sounds are normal. There is no distension.     Palpations: Abdomen is soft.     Tenderness: There is abdominal tenderness. There is no right CVA tenderness, left CVA tenderness, guarding or rebound.  Musculoskeletal:        General: No swelling or tenderness. Normal range of motion.     Cervical back: Normal range of motion and neck supple. No rigidity, tenderness or bony tenderness.     Thoracic back: Normal. No tenderness or bony tenderness.     Lumbar back: Normal. No tenderness or bony tenderness.     Right lower leg: No edema.     Left lower leg: No edema.  Skin:    General: Skin is warm and dry.     Coloration: Skin is not jaundiced.  Neurological:     General: No focal deficit present.     Mental Status: She is alert and oriented to person, place, and time. Mental status is at baseline.     Cranial Nerves: Cranial nerves are intact. No cranial nerve deficit, dysarthria or facial asymmetry.     Sensory: Sensation is intact. No sensory deficit.     Motor: Motor function is intact. No weakness.     Coordination: Coordination is intact. Finger-Nose-Finger Test normal.     Gait: Gait is intact. Gait normal.  Psychiatric:        Mood and Affect: Mood normal.        Behavior: Behavior normal.        Thought Content: Thought content normal.        Judgment: Judgment normal.    ED Results / Procedures / Treatments   Labs (all labs ordered are listed, but only abnormal results  are displayed) Labs Reviewed  CBC  WITH DIFFERENTIAL/PLATELET - Abnormal; Notable for the following components:      Result Value   Platelets 408 (*)    All other components within normal limits  RAPID URINE DRUG SCREEN, HOSP PERFORMED - Abnormal; Notable for the following components:   Tetrahydrocannabinol POSITIVE (*)    All other components within normal limits  URINALYSIS, ROUTINE W REFLEX MICROSCOPIC - Abnormal; Notable for the following components:   APPearance HAZY (*)    pH 9.0 (*)    Hgb urine dipstick MODERATE (*)    Ketones, ur 20 (*)    Bacteria, UA RARE (*)    All other components within normal limits  COMPREHENSIVE METABOLIC PANEL - Abnormal; Notable for the following components:   Sodium 129 (*)    Potassium 3.3 (*)    CO2 19 (*)    Glucose, Bld 122 (*)    BUN 5 (*)    Calcium 8.7 (*)    All other components within normal limits  LIPASE, BLOOD  I-STAT BETA HCG BLOOD, ED ([facility], [facility], [facility] ONLY)    EKG None  Radiology CT ABDOMEN PELVIS W CONTRAST  Result Date: 06/11/2021 CLINICAL DATA:  Acute nonlocalized abdominal pain. History of gastric bypass. EXAM: CT ABDOMEN AND PELVIS WITH CONTRAST TECHNIQUE: Multidetector CT imaging of the abdomen and pelvis was performed using the standard protocol following bolus administration of intravenous contrast. CONTRAST:  85mL OMNIPAQUE IOHEXOL 350 MG/ML SOLN COMPARISON:  None. FINDINGS: Lower chest: Bilateral breast implants. Mild dependent atelectasis in the lung bases. Hepatobiliary: Mild diffuse fatty infiltration of the liver. No focal lesions. Gallbladder and bile ducts are unremarkable. Pancreas: Unremarkable. No pancreatic ductal dilatation or surrounding inflammatory changes. Spleen: Normal in size without focal abnormality. Adrenals/Urinary Tract: Adrenal glands are unremarkable. Kidneys are normal, without renal calculi, focal lesion, or hydronephrosis. Bladder is unremarkable. Stomach/Bowel: Postoperative changes consistent with gastric bypass. Stomach, small bowel,  and colon are not abnormally distended. No wall thickening or inflammatory changes are appreciated. The appendix is normal. Vascular/Lymphatic: No significant vascular findings are present. No enlarged abdominal or pelvic lymph nodes. Reproductive: Status post hysterectomy. No adnexal masses. Other: No free air or free fluid in the abdomen. Abdominal wall musculature appears intact. Musculoskeletal: Degenerative changes in the spine. No destructive bone lesions. IMPRESSION: No acute process demonstrated in the abdomen or pelvis. No evidence of bowel obstruction or inflammation. Electronically Signed   By: Burman Nieves M.D.   On: 06/11/2021 22:54    Procedures Procedures   Medications Ordered in ED Medications  lactated ringers bolus 1,000 mL (0 mLs Intravenous Stopped 06/11/21 2324)  ondansetron (ZOFRAN) injection 4 mg (4 mg Intravenous Given 06/11/21 1937)  morphine 4 MG/ML injection 4 mg (4 mg Intravenous Given 06/11/21 1936)  iohexol (OMNIPAQUE) 350 MG/ML injection 75 mL (75 mLs Intravenous Contrast Given 06/11/21 2248)    ED Course  I have reviewed the triage vital signs and the nursing notes.  Pertinent labs & imaging results that were available during my care of the patient were reviewed by me and considered in my medical decision making (see chart for details).    MDM Rules/Calculators/A&P                         Latish Toutant is a 60 y.o. female with a past medical history of gastric bypass surgery, asthma that is presenting for abdominal pain that has been going on for the last 24 hours.  Patient is hemodynamically stable and in no acute distress. Patient has generalized abdominal pain. No signs of guarding, rigidity or peritonitis. Her abdominal pain is distractible. Her CBC and lipase were unremarkable. Her CMP was notable for normal LFTs. She had a sodium of 129 and was given IV fluids. Her UA showed no signs of infection. Her CT abd/pelvis showed no acute process.  She was given IV  morphine, zofran, fluids with resolution of pain.   Patient is going to follow up with her PCP.   Patient states compliance and understanding of the plan. I explained labs and imaging to the patient. No further questions at this time from the patient.  The patient is safe and stable for discharge at this time with return precautions provided and a plan for follow-up care in place as needed  The plan for this patient was discussed with Dr. Stevie Kern, who voiced agreement and who oversaw evaluation and treatment of this patient.   Final Clinical Impression(s) / ED Diagnoses Final diagnoses:  Generalized abdominal pain    Rx / DC Orders ED Discharge Orders     None        Lottie Dawson, MD 06/12/21 1249    Milagros Loll, MD 06/12/21 2003

## 2021-06-18 DIAGNOSIS — M79676 Pain in unspecified toe(s): Secondary | ICD-10-CM

## 2021-06-30 ENCOUNTER — Other Ambulatory Visit: Payer: Self-pay | Admitting: Podiatry

## 2021-06-30 MED ORDER — OXYCODONE-ACETAMINOPHEN 10-325 MG PO TABS: 1.0000 | ORAL_TABLET | Freq: Three times a day (TID) | ORAL | 0 refills | Status: AC | PRN

## 2021-06-30 MED ORDER — ONDANSETRON HCL 4 MG PO TABS: 4.0000 mg | ORAL_TABLET | Freq: Three times a day (TID) | ORAL | 0 refills | Status: AC | PRN

## 2021-06-30 MED ORDER — CLINDAMYCIN HCL 150 MG PO CAPS: 150.0000 mg | ORAL_CAPSULE | Freq: Three times a day (TID) | ORAL | 0 refills | Status: DC

## 2021-07-02 DIAGNOSIS — M7661 Achilles tendinitis, right leg: Secondary | ICD-10-CM | POA: Diagnosis not present

## 2021-07-02 DIAGNOSIS — M216X1 Other acquired deformities of right foot: Secondary | ICD-10-CM | POA: Diagnosis not present

## 2021-07-02 DIAGNOSIS — M7731 Calcaneal spur, right foot: Secondary | ICD-10-CM | POA: Diagnosis not present

## 2021-07-02 HISTORY — PX: ACHILLES TENDON REPAIR: SUR1153

## 2021-07-04 ENCOUNTER — Other Ambulatory Visit: Payer: Self-pay | Admitting: Family Medicine

## 2021-07-08 ENCOUNTER — Ambulatory Visit (INDEPENDENT_AMBULATORY_CARE_PROVIDER_SITE_OTHER): Payer: 59

## 2021-07-08 ENCOUNTER — Encounter: Payer: Self-pay | Admitting: *Deleted

## 2021-07-08 ENCOUNTER — Encounter: Payer: Self-pay | Admitting: Podiatrist

## 2021-07-08 ENCOUNTER — Other Ambulatory Visit: Payer: Self-pay

## 2021-07-08 ENCOUNTER — Encounter: Payer: 59 | Admitting: Podiatry

## 2021-07-08 ENCOUNTER — Ambulatory Visit (INDEPENDENT_AMBULATORY_CARE_PROVIDER_SITE_OTHER): Payer: 59 | Admitting: Podiatrist

## 2021-07-08 DIAGNOSIS — M7661 Achilles tendinitis, right leg: Secondary | ICD-10-CM

## 2021-07-08 DIAGNOSIS — Z9889 Other specified postprocedural states: Secondary | ICD-10-CM

## 2021-07-08 MED ORDER — OXYCODONE-ACETAMINOPHEN 10-325 MG PO TABS: 1.0000 | ORAL_TABLET | Freq: Three times a day (TID) | ORAL | 0 refills | Status: AC | PRN

## 2021-07-08 NOTE — Progress Notes (Signed)
Patient presents 1 week status post right foot surgery-gastroc recession, excision of heel spur with detach reattach of Achilles tendon right.  She states she is been doing well although the cast has rubbed a sore spot on the anterior aspect of her leg.  Otherwise she states she is getting around okay using the rolling knee scooter.  She also relates she is using pain medication mostly at night.      Patient denies nausea, vomiting, fevers, chills or night sweats.  Denies calf pain or tenderness to the operative side.  Objective:  Neurovascular status is intact with palpable pedal pulses DP and PT at 2+ out of 4 right foot.  Negative homans sign noted.  Neurological sensation is intact and unchanged as per prior to surgery.   Excellent appearance of the postoperative foot is noted.  Dressing is intact upon presenting to the office and once removed staples are noted to be in place with incision sites well coapted.  Minimal swelling and ecchymosis present. Shallow sore on the anterior shin is noted from where the cast rubbed against her leg.  No redness, drainage or sign of infection noted.   Xrays show a well healing surgical foot.  Resection of posterior heel spur is seen.  Excellent post op appearance noted.    Assessment: Status post gastroc recession with excision posterior spur with reattachment achilles tendon right.   Plan: Discussed exam and x-ray findings with the patient.  Overall she is doing very well.  I rewrapped the foot and a dry sterile compressive dressing and applied a below-knee cast in a slightly plantarflexed position.  She is to continue using the rolling knee scooter will be seen back for her scheduled postoperative appointment with Dr. Al Corpus.  Also refilling pain medication for her.  Oxy 10

## 2021-07-09 ENCOUNTER — Encounter: Payer: 59 | Admitting: Podiatry

## 2021-07-12 ENCOUNTER — Other Ambulatory Visit: Payer: Self-pay | Admitting: Family Medicine

## 2021-07-15 ENCOUNTER — Ambulatory Visit (INDEPENDENT_AMBULATORY_CARE_PROVIDER_SITE_OTHER): Payer: 59 | Admitting: Podiatry

## 2021-07-15 ENCOUNTER — Encounter: Payer: Self-pay | Admitting: Podiatry

## 2021-07-15 ENCOUNTER — Other Ambulatory Visit: Payer: Self-pay

## 2021-07-15 DIAGNOSIS — Z9889 Other specified postprocedural states: Secondary | ICD-10-CM

## 2021-07-15 DIAGNOSIS — M7661 Achilles tendinitis, right leg: Secondary | ICD-10-CM

## 2021-07-15 NOTE — Progress Notes (Signed)
She presents today date of surgery 07/02/2021 gastroc recession Achilles tenolysis with repair as well as a heel spur resection and cast application.  She states that getting along okay using a knee scooter and the cast is quite heavy is pulling on the back.  She states that she has not been using an aspirin and she has not been icing behind her knee.  Objective: Vital signs are stable alert and oriented x3 the cast is intact it is dirty to the toe and is starting to breakdown.  She has great range of motion of her toes normal sensation of the toes cast is loose proximally but not distally.  She has tenderness on palpation of her calf and states that when it is flexed removed it will spasm occasionally.  Assessment: Well-healing surgical foot.  Plan: We will follow-up with her in a couple of weeks for cast removal.  She will have a new cast applied at that time

## 2021-07-27 ENCOUNTER — Ambulatory Visit (INDEPENDENT_AMBULATORY_CARE_PROVIDER_SITE_OTHER): Payer: 59 | Admitting: Podiatry

## 2021-07-27 ENCOUNTER — Other Ambulatory Visit: Payer: Self-pay

## 2021-07-27 ENCOUNTER — Encounter: Payer: Self-pay | Admitting: Podiatry

## 2021-07-27 DIAGNOSIS — M7661 Achilles tendinitis, right leg: Secondary | ICD-10-CM | POA: Diagnosis not present

## 2021-07-27 DIAGNOSIS — Z9889 Other specified postprocedural states: Secondary | ICD-10-CM

## 2021-07-28 ENCOUNTER — Ambulatory Visit (INDEPENDENT_AMBULATORY_CARE_PROVIDER_SITE_OTHER): Payer: 59 | Admitting: Family Medicine

## 2021-07-28 ENCOUNTER — Telehealth: Payer: Self-pay | Admitting: *Deleted

## 2021-07-28 ENCOUNTER — Encounter: Payer: Self-pay | Admitting: Family Medicine

## 2021-07-28 VITALS — BP 112/78 | HR 83 | Temp 97.3°F | Resp 16

## 2021-07-28 DIAGNOSIS — H1013 Acute atopic conjunctivitis, bilateral: Secondary | ICD-10-CM | POA: Diagnosis not present

## 2021-07-28 DIAGNOSIS — K219 Gastro-esophageal reflux disease without esophagitis: Secondary | ICD-10-CM

## 2021-07-28 DIAGNOSIS — J3089 Other allergic rhinitis: Secondary | ICD-10-CM | POA: Diagnosis not present

## 2021-07-28 DIAGNOSIS — L2084 Intrinsic (allergic) eczema: Secondary | ICD-10-CM | POA: Diagnosis not present

## 2021-07-28 DIAGNOSIS — J4540 Moderate persistent asthma, uncomplicated: Secondary | ICD-10-CM

## 2021-07-28 MED ORDER — FAMOTIDINE 20 MG PO TABS: 20.0000 mg | ORAL_TABLET | Freq: Two times a day (BID) | ORAL | 1 refills | Status: DC

## 2021-07-28 MED ORDER — GABAPENTIN 100 MG PO CAPS: 100.0000 mg | ORAL_CAPSULE | Freq: Every day | ORAL | 0 refills | Status: DC

## 2021-07-28 NOTE — Progress Notes (Signed)
[address] [place] [address] Dept: [phone]  FOLLOW UP NOTE  Patient ID: Laura Fields, female    DOB: [birth year]  Age: 60 y.o. MRN: [id number] Date of Office Visit: 07/28/2021  Assessment  Chief Complaint: Asthma  HPI Laura Fields is a 60 year old female who presents the clinic for follow-up visit.  She was last seen in this clinic on 12/01/2020 for evaluation of asthma, allergic rhinitis, allergic conjunctivitis, atopic dermatitis, eustachian tube dysfunction, and cough.  At today's visit, she reports her asthma has been moderately well controlled with occasional shortness of breath while lying down at night, wheeze occurring mostly at night, and cough producing clear mucus occurring mostly at night.  She continues montelukast 10 mg once a day, Symbicort 160-2 puffs twice a day with a spacer, Spiriva 2 puffs once a day, and has been using albuterol several days over the last week with moderate relief of symptoms.  Allergic rhinitis is reported as moderately well controlled with symptoms increasing over the last week with the weather change.  She reports symptoms including occasional clear rhinorrhea, nasal congestion, sneezing, and postnasal drainage with frequent throat clearing.  She continues carbinoxamine 4 mg once every 6 hours, flunisolide nasal spray daily, and saline nasal rinses daily.  Her last environmental skin testing was on 12/25/2019 and was positive to grass pollen and weed pollen. Allergic conjunctivitis is reported as well controlled with epinastine as needed.  Atopic dermatitis is reported as moderately well controlled with an occasional red and itchy area on her back for which she uses a daily moisturizing routine as well as desonide ointment as needed.  She denies reflux and is not taking medication to control reflux.  She does have a history of reflux in addition to gastric bypass surgery.  She visited the emergency department on 06/11/2021 for evaluation of  generalized abdominal pain where she had a normal CT scan of the abdomen/pelvis.  She reports that she has an upcoming appointment with her primary care provider who is going to refer her to a gastroenterology specialist.  Her current medications are listed in the chart.  Drug Allergies:  Allergies  Allergen Reactions   Rocephin [Ceftriaxone Sodium In Dextrose] Hives    Physical Exam: BP 112/78   Pulse 83   Temp (!) 97.3 F (36.3 C) (Tympanic)   Resp 16   SpO2 99%    Physical Exam Vitals reviewed.  Constitutional:      Appearance: Normal appearance.  HENT:     Head: Normocephalic and atraumatic.     Right Ear: Tympanic membrane normal.     Left Ear: Tympanic membrane normal.     Nose:     Comments: Bilateral nares slightly erythematous with clear nasal drainage noted.  Pharynx normal.  Ears normal.  Eyes normal.    Mouth/Throat:     Pharynx: Oropharynx is clear.  Eyes:     Conjunctiva/sclera: Conjunctivae normal.  Cardiovascular:     Rate and Rhythm: Normal rate and regular rhythm.     Heart sounds: Normal heart sounds. No murmur heard. Pulmonary:     Effort: Pulmonary effort is normal.     Breath sounds: Normal breath sounds.     Comments: Lungs clear to auscultation Musculoskeletal:        General: Normal range of motion.     Cervical back: Normal range of motion and neck supple.  Skin:    General: Skin is warm and dry.  Neurological:     Mental Status:  She is alert and oriented to person, place, and time.  Psychiatric:        Mood and Affect: Mood normal.        Behavior: Behavior normal.        Thought Content: Thought content normal.        Judgment: Judgment normal.    Diagnostics: FVC 2.74, FEV1 2.07.  Predicted FVC 2.79, predicted FEV1 2.20.  Spirometry indicates normal ventilatory function.  Assessment and Plan: 1. Moderate persistent asthma without complication   2. Seasonal allergic rhinitis with a nonallergic component   3. Allergic conjunctivitis  of both eyes   4. Intrinsic atopic dermatitis   5. Gastroesophageal reflux disease, unspecified whether esophagitis present     Meds ordered this encounter  Medications   famotidine (PEPCID) 20 MG tablet    Sig: Take 1 tablet (20 mg total) by mouth 2 (two) times daily.    Dispense:  62 tablet    Refill:  1     Patient Instructions  Asthma Continue montelukast 10 mg once a day to prevent cough or wheeze Continue Symbicort 160-2 puffs twice a day with a spacer to prevent cough and wheeze Continue Spiriva 2 puffs once a day to prevent cough or wheeze Continue albuterol 2 puffs every 4 hours as needed for cough or wheeze OR Instead use albuterol 0.083% solution via nebulizer one unit vial every 4 hours as needed for cough or wheeze  Allergic rhinitis Continue avoidance measures directed toward grass pollen and weed pollen as listed below Continue carbinoxamine 4 mg tablets to 4 times a day as needed for nasal symptoms Continue flunisolide- 2 sprays in each nostril 2 to 3 times a day.  In the right nostril, point the applicator out toward the right ear. In the left nostril, point the applicator out toward the left ear Consider saline nasal rinses as needed for nasal symptoms. Use this before any medicated nasal sprays for best result Consider allergen immunotherapy if medications are not controlling your symptoms of allergic rhinitis  Allergic conjunctivitis Continue epinastine eye drops 1 drop in each eye twice a day as needed for red, itchy eyes.  Atopic dermatitis Continue a daily moisturizing routine Continue desonide 0.05% ointment to red, itchy areas twice a day as needed. Do not use this medication longer than 3 weeks in a row.   Reflux Begin dietary lifestyle modifications as listed below Begin famotidine 20 mg twice a day to control reflux  Eustachian tube dysfunction Continue flunisolide nasal spray and nasal saline rinses as listed above  Call the clinic if this  treatment plan is not working well for you  Follow up in 4 months or sooner if needed.   Return in about 4 months (around 11/28/2021), or if symptoms worsen or fail to improve.    Thank you for the opportunity to care for this patient.  Please do not hesitate to contact me with questions.  Thermon Leyland, FNP [facility]

## 2021-07-28 NOTE — Telephone Encounter (Signed)
Patient is calling because she was in pain w/ her foot throbbing, unable to sleep last night. Please advise.

## 2021-07-28 NOTE — Patient Instructions (Addendum)
Asthma Continue montelukast 10 mg once a day to prevent cough or wheeze Continue Symbicort 160-2 puffs twice a day with a spacer to prevent cough and wheeze Continue Spiriva 2 puffs once a day to prevent cough or wheeze Continue albuterol 2 puffs every 4 hours as needed for cough or wheeze OR Instead use albuterol 0.083% solution via nebulizer one unit vial every 4 hours as needed for cough or wheeze  Allergic rhinitis Continue avoidance measures directed toward grass pollen and weed pollen as listed below Continue carbinoxamine 4 mg tablets to 4 times a day as needed for nasal symptoms Continue flunisolide- 2 sprays in each nostril 2 to 3 times a day.  In the right nostril, point the applicator out toward the right ear. In the left nostril, point the applicator out toward the left ear Consider saline nasal rinses as needed for nasal symptoms. Use this before any medicated nasal sprays for best result Consider allergen immunotherapy if medications are not controlling your symptoms of allergic rhinitis  Allergic conjunctivitis Continue epinastine eye drops 1 drop in each eye twice a day as needed for red, itchy eyes.  Atopic dermatitis Continue a daily moisturizing routine Continue desonide 0.05% ointment to red, itchy areas twice a day as needed. Do not use this medication longer than 3 weeks in a row.   Reflux Begin dietary lifestyle modifications as listed below Begin famotidine 20 mg twice a day to control reflux Recommend referral to gastroenterology for evaluation and treatment of reflux  Eustachian tube dysfunction Continue flunisolide nasal spray and nasal saline rinses as listed above  Call the clinic if this treatment plan is not working well for you  Follow up in 4 months or sooner if needed.  Reducing Pollen Exposure The American Academy of Allergy[employer] Ast[employer] suggests the following steps to reduce your exposure to pollen during allergy seasons. Do not hang  sheets or clothing out to dry; pollen may collect on these items. Do not mow lawns or spend time around freshly cut grass; mowing stirs up pollen. Keep windows closed at night.  Keep car windows closed while driving. Minimize morning activities outdoors, a time when pollen counts are usually at their highest. Stay indoors as much as possible when pollen counts or humidity is high and on windy days when pollen tends to remain in the air longer. Use air conditioning when possible.  Many air conditioners have filters that trap the pollen spores. Use a HEPA room air filter to remove pollen form the indoor air you breathe.   Lifestyle Changes for Controlling GERD When you have GERD, stomach acid feels as if it's backing up toward your mouth. Whether or not you take medication to control your GERD, your symptoms can often be improved with lifestyle changes.   Raise Your Head Reflux is more likely to strike when you're lying down flat, because stomach fluid can flow backward more easily. Raising the head of your bed 4-6 inches can help. To do this: Slide blocks or books under the legs at the head of your bed. Or, place a wedge under the mattress. Many foam stores can make a suitable wedge for you. The wedge should run from your waist to the top of your head. Don't just prop your head on several pillows. This increases pressure on your stomach. It can make GERD worse.  Watch Your Eating Habits Certain foods may increase the acid in your stomach or relax the lower esophageal sphincter, making GERD more likely.  It's best to avoid the following: Coffee, tea, and carbonated drinks (with and without caffeine) Fatty, fried, or spicy food Mint, chocolate, onions, and tomatoes Any other foods that seem to irritate your stomach or cause you pain  Relieve the Pressure Eat smaller meals, even if you have to eat more often. Don't lie down right after you eat. Wait a few hours for your stomach to  empty. Avoid tight belts and tight-fitting clothes. Lose excess weight.  Tobacco and Alcohol Avoid smoking tobacco and drinking alcohol. They can make GERD symptoms worse.

## 2021-07-28 NOTE — Progress Notes (Signed)
She presents today date of surgery 07/02/2021 gastroc recession Achilles tenolysis.  Presents with her cast intact utilizing her knee scooter.  Objective: Vital signs are stable she is alert oriented x3 staples are intact she has tenderness on palpation of her leg and posterior ankle.  Staples were removed margins remain well coapted no signs of infection open wound or lesion.  Assessment: Well-healing surgical foot.  Plan: Staples were removed today placed her in a cam walker encouraged her not to ambulate on this as of yet continue nonweightbearing stance the only thing that she basically gets to do with this is some exercises and to wash the foot and leg.

## 2021-07-31 ENCOUNTER — Other Ambulatory Visit: Payer: Self-pay | Admitting: Family Medicine

## 2021-08-03 ENCOUNTER — Other Ambulatory Visit: Payer: Self-pay

## 2021-08-03 ENCOUNTER — Encounter: Payer: Self-pay | Admitting: Family

## 2021-08-03 ENCOUNTER — Ambulatory Visit (INDEPENDENT_AMBULATORY_CARE_PROVIDER_SITE_OTHER): Payer: 59 | Admitting: Family

## 2021-08-03 VITALS — BP 110/70 | HR 70 | Temp 97.9°F | Ht 66.0 in

## 2021-08-03 DIAGNOSIS — L723 Sebaceous cyst: Secondary | ICD-10-CM | POA: Diagnosis not present

## 2021-08-03 DIAGNOSIS — M79641 Pain in right hand: Secondary | ICD-10-CM | POA: Diagnosis not present

## 2021-08-03 DIAGNOSIS — R109 Unspecified abdominal pain: Secondary | ICD-10-CM

## 2021-08-03 DIAGNOSIS — M79642 Pain in left hand: Secondary | ICD-10-CM

## 2021-08-03 DIAGNOSIS — R1033 Periumbilical pain: Secondary | ICD-10-CM

## 2021-08-03 NOTE — Progress Notes (Signed)
Laura Fields is a 60 y.o. female with the following history as recorded in [employer]:  Patient Active Problem List   Diagnosis Date Noted   Gastroesophageal reflux disease 07/28/2021   Intrinsic atopic dermatitis 12/01/2020   Dysfunction of both eustachian tubes 12/01/2020   Pruritic rash 02/12/2020   Allergic conjunctivitis of both eyes 02/12/2020   Seasonal allergic rhinitis with a nonallergic component 12/25/2019   Allergic conjunctivitis 12/25/2019   Moderate persistent asthma without complication 12/25/2019    Current Outpatient Medications  Medication Sig Dispense Refill   albuterol (VENTOLIN HFA) 108 (90 Base) MCG/ACT inhaler INHALE 2 PUFFS INTO THE LUNGS EVERY 4 HOURS AS NEEDED FOR WHEEZE OR FOR SHORTNESS OF BREATH 18 each 1   AMBULATORY NON FORMULARY MEDICATION Apply 1 application topically at bedtime. Medication Name: Compounded Hydroquinone 8%, Tretinoin 0.025%, Kojic Acid 1%, Niacinamide 4%, Fluocinolone 0.025% Cream ([employer]) 30 g 0   baclofen (LIORESAL) 10 MG tablet Take 10 mg by mouth 3 (three) times daily.     Carbinoxamine Maleate 4 MG TABS Take 1 tablet every 8 hours for runny nose. 270 tablet 1   cyclobenzaprine (FLEXERIL) 5 MG tablet Take 5 mg by mouth 3 (three) times daily as needed for muscle spasms.     desonide (DESOWEN) 0.05 % ointment APPLY 1 APPLICATION TOPICALLY 2 TIMES DAILY AS NEEDED TO RED ITCHY AREAS TO THE FACE. 45 g 1   Epinastine HCl 0.05 % ophthalmic solution Apply to eye as needed.     famotidine (PEPCID) 20 MG tablet Take 1 tablet (20 mg total) by mouth 2 (two) times daily. 62 tablet 1   flunisolide (NASALIDE) 25 MCG/ACT (0.025%) SOLN 2 SPRAYS PER NOSTRIL 2-3 TIMES PER DAY AS NEEDED FOR STUFFY NOSE. 75 mL 1   gabapentin (NEURONTIN) 100 MG capsule Take 1 capsule (100 mg total) by mouth at bedtime. 90 capsule 0   hydrOXYzine (ATARAX/VISTARIL) 25 MG tablet Take 1 tablet (25 mg total) by mouth 3 (three) times daily as needed for itching. 60 tablet 0    meloxicam (MOBIC) 15 MG tablet Take 15 mg by mouth daily.     montelukast (SINGULAIR) 10 MG tablet TAKE 1 TABLET BY MOUTH EVERYDAY AT BEDTIME 90 tablet 0   Olopatadine HCl (PATADAY) 0.2 % SOLN Place 1 drop into both eyes daily as needed. 2.5 mL 5   Olopatadine HCl 0.6 % SOLN Place 2 sprays into both nostrils 2 (two) times daily as needed. 30.5 g 5   ondansetron (ZOFRAN) 4 MG tablet Take 1 tablet (4 mg total) by mouth every 8 (eight) hours as needed. 20 tablet 0   pregabalin (LYRICA) 150 MG capsule Take 150 mg by mouth 2 (two) times daily.     SYMBICORT 160-4.5 MCG/ACT inhaler INHALE 2 PUFFS INTO THE LUNGS TWICE A DAY 10 g 2   Tiotropium Bromide Monohydrate (SPIRIVA RESPIMAT) 1.25 MCG/ACT AERS INHALE 2 PUFFS BY MOUTH INTO THE LUNGS DAILY 12 g 1   DULoxetine (CYMBALTA) 60 MG capsule Take 60 mg by mouth daily. (Patient not taking: Reported on 08/03/2021)     No current facility-administered medications for this visit.    Allergies: Rocephin [ceftriaxone sodium in dextrose]  Past Medical History:  Diagnosis Date   Asthma    Intrinsic atopic dermatitis 12/01/2020    Past Surgical History:  Procedure Laterality Date   ABDOMINAL HYSTERECTOMY     AUGMENTATION MAMMAPLASTY Bilateral 1999   Saline/ under muscle   back nerve     block  GASTRIC BYPASS     KNEE ARTHROSCOPY     mass removable     on thumb   STOMACH SURGERY      Family History  Problem Relation Age of Onset   Hypertension Mother    Allergic rhinitis Neg Hx    Angioedema Neg Hx    Asthma Neg Hx    Eczema Neg Hx    Immunodeficiency Neg Hx    Urticaria Neg Hx     Social History   Tobacco Use   Smoking status: Never    Passive exposure: Yes   Smokeless tobacco: Never  Substance Use Topics   Alcohol use: Yes    Subjective:  Requesting referral to GI, dermatology and orthopedist;  Continuing to have pain in umbilical area- strong odor x 6 months; did not respond to antibiotics while on recent treatment for foot;      Objective:  Vitals:   08/03/21 1020  BP: 110/70  Pulse: 70  Temp: 97.9 F (36.6 C)  TempSrc: Oral  SpO2: 99%  Height: 5\' 6"  (1.676 m)    General: Well developed, well nourished, in no acute distress  Skin : Warm and dry.  Head: Normocephalic and atraumatic  Eyes: Sclera and conjunctiva clear; pupils round and reactive to light; extraocular movements intact  Ears: External normal; canals clear; tympanic membranes normal  Oropharynx: Pink, supple. No suspicious lesions  Neck: Supple without thyromegaly, adenopathy  Lungs: Respirations unlabored; clear to auscultation bilaterally without wheeze, rales, rhonchi  Neurologic: Alert and oriented; speech intact; face symmetrical; on scooter/ non weight bearing;   Assessment:  1. Pain in both hands   2. Sebaceous cyst   3. Abdominal pain, unspecified abdominal location   4. Umbilical pain     Plan:  Refer to orthopedist- needs hand specialist specifically; Refer back to dermatology for evaluation and treatment;  ? Related to umbilical source- she will plan to see surgeon first and if pain persists after umbilical issue treated, she will see GI. Refer to surgeon- ? Umbilical hernia with secondary infection;   This visit occurred during the SARS-CoV-2 public health emergency.  Safety protocols were in place, including screening questions prior to the visit, additional usage of staff PPE, and extensive cleaning of exam room while observing appropriate contact time as indicated for disinfecting solutions.    No follow-ups on file.  Orders Placed This Encounter  Procedures   Ambulatory referral to Orthopedic Surgery    Referral Priority:   Routine    Referral Type:   Surgical    Referral Reason:   Specialty Services Required    Requested Specialty:   Orthopedic Surgery    Number of Visits Requested:   1   Ambulatory referral to Dermatology    Referral Priority:   Routine    Referral Type:   Consultation    Referral Reason:    Specialty Services Required    Requested Specialty:   Dermatology    Number of Visits Requested:   1   Ambulatory referral to Gastroenterology    Referral Priority:   Routine    Referral Type:   Consultation    Referral Reason:   Specialty Services Required    Number of Visits Requested:   1   Ambulatory referral to General Surgery    Referral Priority:   Routine    Referral Type:   Surgical    Referral Reason:   Specialty Services Required    Requested Specialty:   General Surgery  Number of Visits Requested:   1    Requested Prescriptions    No prescriptions requested or ordered in this encounter

## 2021-08-05 ENCOUNTER — Ambulatory Visit (INDEPENDENT_AMBULATORY_CARE_PROVIDER_SITE_OTHER): Payer: 59

## 2021-08-05 ENCOUNTER — Encounter: Payer: Self-pay | Admitting: Orthopedic Surgery

## 2021-08-05 ENCOUNTER — Ambulatory Visit (INDEPENDENT_AMBULATORY_CARE_PROVIDER_SITE_OTHER): Payer: 59 | Admitting: Orthopedic Surgery

## 2021-08-05 ENCOUNTER — Other Ambulatory Visit: Payer: Self-pay

## 2021-08-05 ENCOUNTER — Ambulatory Visit: Payer: Self-pay

## 2021-08-05 VITALS — BP 123/94 | HR 71

## 2021-08-05 DIAGNOSIS — M79641 Pain in right hand: Secondary | ICD-10-CM | POA: Diagnosis not present

## 2021-08-05 DIAGNOSIS — M79642 Pain in left hand: Secondary | ICD-10-CM | POA: Diagnosis not present

## 2021-08-05 NOTE — Progress Notes (Signed)
Office Visit Note   Patient: Laura Fields           Date of Birth: [birth year]           MRN: [id number] Visit Date: 08/05/2021              Requested by: Olive Bass, FNP [address] [place],  [place] [address] PCP: Olive Bass, FNP   Assessment & Plan: Visit Diagnoses:  1. Pain in right hand   2. Pain in left hand     Plan: Discussed that the symptoms involving her left hand may be related to carpal tunnel syndrome.  Specifically, the numbness and paresthesias in her hand that wake her at night are consistent with carpal tunnel syndrome.  She hasn't tried any treatment to date so we will start with a night brace to hopefully help with her nocturnal symptoms.  We will also send her for electrodiagnostic studies to further evaluate.  Regarding her right thumb, it feels to me like this is scar tissue secondary to her previous operation.  I did not feel any firm or discrete mass.  She wants to continue to monitor this area for mass recurrence.  I will see her back in the office in 4-6 weeks.   Follow-Up Instructions: No follow-ups on file.   Orders:  Orders Placed This Encounter  Procedures   XR Hand Complete Right   XR Hand Complete Left   No orders of the defined types were placed in this encounter.     Procedures: No procedures performed   Clinical Data: No additional findings.   Subjective: Chief Complaint  Patient presents with   Right Hand - New Patient (Initial Visit)   Left Hand - New Patient (Initial Visit)    This is a 60 yo RHD F who presents with complaints involving both hands.  On the right, she had a mass removed from the pad of her thumb approximately one year ago.  She is concerned that the mass is coming back.  She is asymptomatic with no pain in this area.  She just wants to get checked out.  Regarding her left hand, she describes numbness and paresthesias in all of her fingers that happens sporadically.  She  does not have any numbness or paresthesias currently but she does during certain portions of her day.  She also describes waking up at night with her hand feeling numb and tingling requiring her to hold her hand over the edge of the bed for symptom relief.  This been going on for over a year.  She is not had any treatment to date.  Risk Factors Diabetes: Denies Hypothyroid: Denies C-spine: Denies Inflammatory: Denies History of trauma: Denies  Electrodiagnostic studies: None  Treatment to date: None     Review of Systems   Objective: Vital Signs: BP (!) 123/94 (BP Location: Right Arm, Patient Position: Sitting, Cuff Size: Normal)   Pulse 71   SpO2 98%   Physical Exam Constitutional:      Appearance: Normal appearance.  Cardiovascular:     Rate and Rhythm: Normal rate.     Pulses: Normal pulses.  Pulmonary:     Effort: Pulmonary effort is normal.  Skin:    General: Skin is warm and dry.     Capillary Refill: Capillary refill takes less than 2 seconds.  Neurological:     Mental Status: She is alert.    Right Hand Exam   Tenderness  The patient is experiencing no tenderness.   Range of Motion  The patient has normal right wrist ROM.   Muscle Strength  The patient has normal right wrist strength.  Other  Erythema: absent Sensation: normal Pulse: present  Comments:  Scar in area of previous mass excision at volar thumb pad.  No discrete mass appreciated.    Left Hand Exam   Tenderness  The patient is experiencing no tenderness.   Range of Motion  The patient has normal left wrist ROM.  Muscle Strength  The patient has normal left wrist strength.  Other  Erythema: absent Sensation: normal Pulse: present  Comments:  Positive Tinel at wrist w/ symptoms into long finger.  Equivocal carpal tunnel compression test.  5/5 thenar muscle strength with no atrophy.     Specialty Comments:  No specialty comments available.  Imaging: 3 views of  bilateral wrist taken today reviewed interpreted by me.  They demonstrate no acute bony injury.  There is no evidence of instability or dislocation.  There is no evidence of significant degenerative changes.   PMFS History: Patient Active Problem List   Diagnosis Date Noted   Gastroesophageal reflux disease 07/28/2021   Intrinsic atopic dermatitis 12/01/2020   Dysfunction of both eustachian tubes 12/01/2020   Pruritic rash 02/12/2020   Allergic conjunctivitis of both eyes 02/12/2020   Seasonal allergic rhinitis with a nonallergic component 12/25/2019   Allergic conjunctivitis 12/25/2019   Moderate persistent asthma without complication 12/25/2019   Past Medical History:  Diagnosis Date   Asthma    Intrinsic atopic dermatitis 12/01/2020    Family History  Problem Relation Age of Onset   Hypertension Mother    Allergic rhinitis Neg Hx    Angioedema Neg Hx    Asthma Neg Hx    Eczema Neg Hx    Immunodeficiency Neg Hx    Urticaria Neg Hx     Past Surgical History:  Procedure Laterality Date   ABDOMINAL HYSTERECTOMY     AUGMENTATION MAMMAPLASTY Bilateral 1999   Saline/ under muscle   back nerve     block   GASTRIC BYPASS     KNEE ARTHROSCOPY     mass removable     on thumb   STOMACH SURGERY     Social History   Occupational History   Not on file  Tobacco Use   Smoking status: Never    Passive exposure: Yes   Smokeless tobacco: Never  Vaping Use   Vaping Use: Never used  Substance and Sexual Activity   Alcohol use: Yes   Drug use: Never   Sexual activity: Not on file

## 2021-08-10 ENCOUNTER — Other Ambulatory Visit: Payer: Self-pay

## 2021-08-10 ENCOUNTER — Ambulatory Visit (INDEPENDENT_AMBULATORY_CARE_PROVIDER_SITE_OTHER): Payer: 59 | Admitting: Podiatry

## 2021-08-10 ENCOUNTER — Encounter: Payer: Self-pay | Admitting: Podiatry

## 2021-08-10 DIAGNOSIS — Z9889 Other specified postprocedural states: Secondary | ICD-10-CM

## 2021-08-10 DIAGNOSIS — M7661 Achilles tendinitis, right leg: Secondary | ICD-10-CM

## 2021-08-10 MED ORDER — GABAPENTIN 100 MG PO CAPS: 100.0000 mg | ORAL_CAPSULE | Freq: Three times a day (TID) | ORAL | 3 refills | Status: DC

## 2021-08-10 NOTE — Progress Notes (Signed)
She presents today states that she is doing better than she has been but she is still hurting.  Date of surgery 07/02/2021 gastroc recession with Achilles tendon repair and resection of a heel spur.  She states that is swelling at night and mild and mild leg it is hurting quite a bit the medication he gave me really help at night though.  Objective: Vital signs are stable alert oriented x3.  Pulses are palpable.  There is no erythema cellulitis drainage or odor.  Her incisions have gone on to heal uneventfully she has a very nice contour to her Achilles with minimal minimal pain on palpation.  She does have allodynic pain from her proximal incision distally even over the top of the foot and anterior ankle.  Assessment: Well-healing surgical foot.  Plan: We did discuss the allodynic type pain today and ways to go better control it such as increasing her gabapentin for the time being and as well as contrast baths.  I would like her to start with some partial weightbearing and progress to full weightbearing at this point I would like to follow-up with her and about 2 weeks.

## 2021-08-13 ENCOUNTER — Other Ambulatory Visit: Payer: Self-pay | Admitting: Family Medicine

## 2021-08-20 ENCOUNTER — Telehealth: Payer: Self-pay | Admitting: *Deleted

## 2021-08-20 NOTE — Telephone Encounter (Signed)
Pt is schduled for flu split dose nov 2nd at 1030 am

## 2021-08-20 NOTE — Telephone Encounter (Signed)
Pt called and would like to know Anne's option on her getting a flu vaccine. She has not received one since the 80's and she stated she was in the bed for 3 weeks with headache, chills, diarrhea. If Thurston Hole does recommend her to get it then she would like to receive it at our office. Anne please advise.

## 2021-08-20 NOTE — Telephone Encounter (Signed)
I would recommend an influenza vaccine. We can do a graded dose if she is interested. Thank you

## 2021-08-21 ENCOUNTER — Other Ambulatory Visit: Payer: Self-pay | Admitting: Family Medicine

## 2021-08-25 ENCOUNTER — Ambulatory Visit (INDEPENDENT_AMBULATORY_CARE_PROVIDER_SITE_OTHER): Payer: 59 | Admitting: Family Medicine

## 2021-08-25 ENCOUNTER — Encounter: Payer: Self-pay | Admitting: Family Medicine

## 2021-08-25 VITALS — BP 124/76 | HR 67 | Temp 98.0°F | Resp 17

## 2021-08-25 DIAGNOSIS — T50995D Adverse effect of other drugs, medicaments and biological substances, subsequent encounter: Secondary | ICD-10-CM | POA: Diagnosis not present

## 2021-08-25 DIAGNOSIS — T50995S Adverse effect of other drugs, medicaments and biological substances, sequela: Secondary | ICD-10-CM

## 2021-08-25 NOTE — Progress Notes (Signed)
[address] [place] [address] Dept: [phone]  FOLLOW UP NOTE  Patient ID: Laura Fields, female    DOB: December 25, 1960  Age: 60 y.o. MRN: [id number] Date of Office Visit: 08/25/2021  Assessment  Chief Complaint: Immunizations  HPI Laura Fields is a 60 year old female who presents the clinic for follow-up visit.  She was last seen in this clinic on 07/28/2021 for evaluation of asthma, allergic rhinitis, allergic conjunctivitis, atopic dermatitis, and reflux.  At today's visit she reports that she is feeling well overall with no cardiovascular, gastrointestinal, or integumentary symptoms.  She does report that she had a reaction after receiving an influenza vaccine in the 1980s with symptoms including headache, chills, and diarrhea for 3 weeks.  She has not had an influenza vaccine since that time.  She has had 2 COVID vaccines and a COVID booster with no adverse reaction.  Her current medications are listed in the chart.  Drug Allergies:  Allergies  Allergen Reactions   Rocephin [Ceftriaxone Sodium In Dextrose] Hives    Physical Exam: BP 124/76   Pulse 67   Temp 98 F (36.7 C) (Temporal)   Resp 17   SpO2 100%    Physical Exam Vitals reviewed.  Constitutional:      Appearance: Normal appearance.  HENT:     Head: Normocephalic and atraumatic.     Right Ear: Tympanic membrane normal.     Left Ear: Tympanic membrane normal.     Nose:     Comments: Bilateral nares slightly erythematous with clear nasal drainage noted.  Pharynx normal.  Ears normal.  Eyes normal.    Mouth/Throat:     Pharynx: Oropharynx is clear.  Eyes:     Conjunctiva/sclera: Conjunctivae normal.  Cardiovascular:     Rate and Rhythm: Normal rate and regular rhythm.     Heart sounds: Normal heart sounds. No murmur heard. Pulmonary:     Effort: Pulmonary effort is normal.     Breath sounds: Normal breath sounds.     Comments: Lungs clear to auscultation Musculoskeletal:        General: Normal range  of motion.     Cervical back: Normal range of motion and neck supple.  Skin:    General: Skin is warm and dry.  Neurological:     Mental Status: She is alert and oriented to person, place, and time.  Psychiatric:        Mood and Affect: Mood normal.        Behavior: Behavior normal.        Thought Content: Thought content normal.        Judgment: Judgment normal.   In office graded influenza vaccine challenge: Open graded influenza vaccine challenge: The patient was able to tolerate the influenza vaccine challenge today without adverse signs or symptoms. Vital signs were stable throughout the challenge and observation period. She received 2 doses separated by 30 minutes, each of which was separated by a brief physical exam. She received the following doses: 0.15 ml and 0.35 ml.  She was monitored for 30 minutes following the last dose.   The patient was able to tolerate the open graded oral challenge today without adverse signs or symptoms. Therefore, she has the same risk of systemic reaction associated with  receiving an influenza vaccine  as the general population.   Assessment and Plan: 1. Adverse effect of other drugs, medicaments and biological substances, sequela     Patient Instructions  Graded influenza injection  Laura Davenport  Fields was able to tolerate the influenza vaccine today at the office without adverse signs or symptoms of an allergic reaction. Therefore, she has the same risk of systemic reaction associated with the influenza vaccine as the general population.  - Monitor for allergic symptoms such as rash, wheezing, diarrhea, swelling, and vomiting for the next 24 hours. If severe symptoms occur call 911. For less severe symptoms treat with Benadryl 50 mg every 4 hours and call the clinic.    Call the clinic if this treatment plan is not working well for you  Follow up in 3 months or sooner if needed  Return in about 3 months (around 11/25/2021), or if symptoms worsen or fail  to improve.    Thank you for the opportunity to care for this patient.  Please do not hesitate to contact me with questions.  Thermon Leyland, FNP [facility]

## 2021-08-25 NOTE — Patient Instructions (Addendum)
Graded influenza injection  Laura Fields was able to tolerate the influenza vaccine today at the office without adverse signs or symptoms of an allergic reaction. Therefore, she has the same risk of systemic reaction associated with the influenza vaccine as the general population.  - Monitor for allergic symptoms such as rash, wheezing, diarrhea, swelling, and vomiting for the next 24 hours. If severe symptoms occur call 911. For less severe symptoms treat with Benadryl 50 mg every 4 hours and call the clinic.    Call the clinic if this treatment plan is not working well for you  Follow up in 3 months or sooner if needed

## 2021-08-26 ENCOUNTER — Ambulatory Visit (INDEPENDENT_AMBULATORY_CARE_PROVIDER_SITE_OTHER): Payer: 59 | Admitting: Podiatry

## 2021-08-26 ENCOUNTER — Other Ambulatory Visit: Payer: Self-pay

## 2021-08-26 ENCOUNTER — Encounter: Payer: Self-pay | Admitting: Podiatry

## 2021-08-26 DIAGNOSIS — Z9889 Other specified postprocedural states: Secondary | ICD-10-CM

## 2021-08-26 DIAGNOSIS — M7661 Achilles tendinitis, right leg: Secondary | ICD-10-CM

## 2021-08-26 NOTE — Progress Notes (Signed)
She presents today date of surgery 07/02/2021 gastroc recession with Achilles tenolysis and heel spur resection.  States that is sore and swollen I try to stand and wash a few dishes and it got really sore and swollen on the outside as she points to the lateral aspect just below her lateral malleolus.  She states that the contrast bath was too painful.  Objective: Vital signs are stable she is alert and oriented x3.  She still has exquisite allodynic type pain on palpation of the posterior medial lateral heel but muscle strength is good.  The tendon itself feels good as well as the gastroc.  Assessment: Well-healing surgical foot just done considerable postoperative discomfort at this point.  Plan: I will send her to physical therapy for them to decrease her allodynic pain also to strengthen her arm and perform help her with her balance.  I will follow-up with her once they are done with her.

## 2021-09-02 ENCOUNTER — Other Ambulatory Visit: Payer: Self-pay

## 2021-09-02 ENCOUNTER — Ambulatory Visit (INDEPENDENT_AMBULATORY_CARE_PROVIDER_SITE_OTHER): Payer: 59 | Admitting: Orthopedic Surgery

## 2021-09-02 ENCOUNTER — Encounter: Payer: Self-pay | Admitting: Orthopedic Surgery

## 2021-09-02 DIAGNOSIS — R202 Paresthesia of skin: Secondary | ICD-10-CM | POA: Diagnosis not present

## 2021-09-02 DIAGNOSIS — R200 Anesthesia of skin: Secondary | ICD-10-CM | POA: Diagnosis not present

## 2021-09-02 NOTE — Progress Notes (Signed)
Office Visit Note   Patient: Laura Fields           Date of Birth: 01/05/1961           MRN: [id number] Visit Date: 09/02/2021              Requested by: Olive Bass, FNP [address] [place],  [place] [address] PCP: Olive Bass, FNP   Assessment & Plan: Visit Diagnoses:  1. Numbness and tingling in left hand     Plan: Discussed with patient that her symptoms are somewhat non specific but could be consistent with carpal tunnel syndrome.   She does have nocturnal symptoms that wake her up and require her to hold her hands below the bed for relief.  Her provocative signs are equivocal.  She has diminished sensation on 2PD.  We will get a NCS/EMG to evaluate her symptoms.  I'll see her back once this is complete.   Follow-Up Instructions: No follow-ups on file.   Orders:  No orders of the defined types were placed in this encounter.  No orders of the defined types were placed in this encounter.     Procedures: No procedures performed   Clinical Data: No additional findings.   Subjective: Chief Complaint  Patient presents with   Right Hand - Follow-up   Left Hand - Follow-up    This is a 60 year old right-hand-dominant female who presents with left hand numbness and tingling.  This been going on for about a year per the patient.  She describes vague symptoms involving numbness in all of her fingers.  She does wake up at night and has to hold her hand below the level of the bed for symptom relief.  She is not a diabetic.  She has no history of cervical spine or thyroid disease.  She has been wearing a wrist brace at night which she says is not helping.   Review of Systems   Objective: Vital Signs: There were no vitals taken for this visit.  Physical Exam Constitutional:      Appearance: Normal appearance.  Cardiovascular:     Rate and Rhythm: Normal rate.     Pulses: Normal pulses.  Pulmonary:     Effort: Pulmonary effort  is normal.  Skin:    General: Skin is warm and dry.     Capillary Refill: Capillary refill takes less than 2 seconds.  Neurological:     Mental Status: She is alert.    Left Hand Exam   Tenderness  The patient is experiencing no tenderness.   Range of Motion  The patient has normal left wrist ROM.  Other  Erythema: absent Sensation: decreased Pulse: present  Comments:  Equivocal provocative signs with carpal tunnel compression test causing numbness in base of middle finger.  2PD is 8-10 mm in all fingers.       Specialty Comments:  No specialty comments available.  Imaging: No results found.   PMFS History: Patient Active Problem List   Diagnosis Date Noted   Numbness and tingling in left hand 09/02/2021   Gastroesophageal reflux disease 07/28/2021   Intrinsic atopic dermatitis 12/01/2020   Dysfunction of both eustachian tubes 12/01/2020   Pruritic rash 02/12/2020   Allergic conjunctivitis of both eyes 02/12/2020   Seasonal allergic rhinitis with a nonallergic component 12/25/2019   Allergic conjunctivitis 12/25/2019   Moderate persistent asthma without complication 12/25/2019   Past Medical History:  Diagnosis Date   Asthma  Intrinsic atopic dermatitis 12/01/2020    Family History  Problem Relation Age of Onset   Hypertension Mother    Allergic rhinitis Neg Hx    Angioedema Neg Hx    Asthma Neg Hx    Eczema Neg Hx    Immunodeficiency Neg Hx    Urticaria Neg Hx     Past Surgical History:  Procedure Laterality Date   ABDOMINAL HYSTERECTOMY     AUGMENTATION MAMMAPLASTY Bilateral 1999   Saline/ under muscle   back nerve     block   GASTRIC BYPASS     KNEE ARTHROSCOPY     mass removable     on thumb   STOMACH SURGERY     Social History   Occupational History   Not on file  Tobacco Use   Smoking status: Never    Passive exposure: Yes   Smokeless tobacco: Never  Vaping Use   Vaping Use: Never used  Substance and Sexual Activity   Alcohol  use: Yes   Drug use: Never   Sexual activity: Not on file

## 2021-09-03 ENCOUNTER — Ambulatory Visit: Payer: 59 | Admitting: Physical Therapy

## 2021-09-06 ENCOUNTER — Ambulatory Visit (INDEPENDENT_AMBULATORY_CARE_PROVIDER_SITE_OTHER): Payer: 59

## 2021-09-06 DIAGNOSIS — Z23 Encounter for immunization: Secondary | ICD-10-CM | POA: Diagnosis not present

## 2021-09-06 NOTE — Progress Notes (Signed)
   Covid-19 Vaccination Clinic  Name:  Laura Fields    MRN: [id number] DOB: 17-Sep-1961  09/06/2021  [name] was observed post Covid-19 immunization for 15 minutes without incident. She was provided with Vaccine Information Sheet and instruction to access the V-Safe system.   [name] was instructed to call 911 with any severe reactions post vaccine: Difficulty breathing  Swelling of face and throat  A fast heartbeat  A bad rash all over body  Dizziness and weakness  [name] stated she wasn't feeling good and had coughing, Dr Maurine Minister recommended for her to get a Covid-19 test done before giving  the Booster vaccine, she tested negative in the office and was given the Booster vaccine in the office.

## 2021-09-08 ENCOUNTER — Ambulatory Visit (INDEPENDENT_AMBULATORY_CARE_PROVIDER_SITE_OTHER): Payer: 59 | Admitting: Physical Therapy

## 2021-09-08 ENCOUNTER — Other Ambulatory Visit: Payer: Self-pay

## 2021-09-08 ENCOUNTER — Encounter: Payer: Self-pay | Admitting: Physical Therapy

## 2021-09-08 DIAGNOSIS — R2689 Other abnormalities of gait and mobility: Secondary | ICD-10-CM

## 2021-09-08 DIAGNOSIS — M25571 Pain in right ankle and joints of right foot: Secondary | ICD-10-CM | POA: Diagnosis not present

## 2021-09-08 DIAGNOSIS — M6281 Muscle weakness (generalized): Secondary | ICD-10-CM

## 2021-09-08 DIAGNOSIS — R262 Difficulty in walking, not elsewhere classified: Secondary | ICD-10-CM | POA: Diagnosis not present

## 2021-09-08 NOTE — Patient Instructions (Signed)
Access Code: [email or web address] URL: [email or web address] Date: 09/08/2021 Prepared by: Reggy Eye  Exercises Seated Ankle Dorsiflexion AROM - 1 x daily - 7 x weekly - 3 sets - 10 reps Seated Heel Raise - 1 x daily - 7 x weekly - 3 sets - 10 reps Ankle Inversion Eversion Towel Slide - 1 x daily - 7 x weekly - 3 sets - 10 reps Seated Long Arc Quad - 1 x daily - 7 x weekly - 1 sets - 10 reps - 3 seconds hold Side to Side Weight Shift with Counter Support - 1 x daily - 7 x weekly - 2 sets - 1 minute hold Staggered Stance Forward Backward Weight Shift with Counter Support - 1 x daily - 7 x weekly - 2 sets - 1 minute hold

## 2021-09-08 NOTE — Therapy (Signed)
[facility] [address] [place], [place], [address] Phone: [phone]   Fax:  [phone]  Physical Therapy Evaluation  Patient Details  Name: Laura Fields MRN: [id number] Date of Birth: 12/13/1960 Referring Provider (PT): Hyatt,Max   Encounter Date: 09/08/2021   PT End of Session - 09/08/21 1224     Visit Number 1    Number of Visits 12    Date for PT Re-Evaluation 10/20/21    PT Start Time 1136    PT Stop Time 1220    PT Time Calculation (min) 44 min    Activity Tolerance Patient tolerated treatment well    Behavior During Therapy [facility] for tasks assessed/performed             Past Medical History:  Diagnosis Date   Asthma    Intrinsic atopic dermatitis 12/01/2020    Past Surgical History:  Procedure Laterality Date   ABDOMINAL HYSTERECTOMY     AUGMENTATION MAMMAPLASTY Bilateral 1999   Saline/ under muscle   back nerve     block   COLONOSCOPY  09/23/2013   [facility]   GASTRIC BYPASS     KNEE ARTHROSCOPY     mass removable     on thumb   STOMACH SURGERY      There were no vitals filed for this visit.    Subjective Assessment - 09/08/21 1134     Subjective Pt had an achilles injury and bone spurs in 2020 but waited to have surgery until Covid was not as bad. Pt is s/p Rt achilles tendon repair 07/02/2021. She is wearing boot and using knee scooter for mobility.    Pertinent History RA, DDD lumbar spine    Limitations House hold activities;Standing;Walking    How long can you stand comfortably? 5-10 minutes    How long can you walk comfortably? <5 minutes    Patient Stated Goals be able to walk and wt bear Rt foot/ankle    Currently in Pain? Yes    Pain Score 9     Pain Location Ankle    Pain Orientation Right    Pain Descriptors / Indicators Sharp;Throbbing    Pain Type Surgical pain    Pain Onset More than a month ago    Pain Frequency Constant    Aggravating Factors  standing,  pressure    Pain Relieving Factors compression, ice                [facility] PT Assessment - 09/08/21 0001       Assessment   Medical Diagnosis Achilles repair    Referring Provider (PT) Hyatt,Max    Onset Date/Surgical Date 07/02/21    Next MD Visit 09/24/21    Prior Therapy none      Precautions   Precaution Comments Rt boot      Restrictions   Weight Bearing Restrictions Yes    RLE Weight Bearing Weight bearing as tolerated      Balance Screen   Has the patient fallen in the past 6 months No      Home Environment   Additional Comments pt lives alone, 1 level home, 3 steps to enter      Prior Function   Level of Independence Independent      Observation/Other Assessments   Focus on Therapeutic Outcomes (FOTO)  32      Sensation   Additional Comments hypersensitive Rt foot and ankle      ROM / Strength  AROM / PROM / Strength AROM;Strength;PROM      AROM   AROM Assessment Site Ankle    Right/Left Ankle Right    Right Ankle Dorsiflexion -20    Right Ankle Plantar Flexion 25    Right Ankle Inversion 5    Right Ankle Eversion 5      PROM   PROM Assessment Site Ankle    Right/Left Ankle Right    Right Ankle Dorsiflexion 0    Right Ankle Plantar Flexion 30    Right Ankle Inversion 8    Right Ankle Eversion 10      Strength   Overall Strength Comments Lt ankle strength grossly 4+/5. Rt knee flexion 3/5, knee extension 3/5, Rt hip flex 3-/5    Strength Assessment Site Ankle    Right/Left Ankle Right    Right Ankle Dorsiflexion 3-/5    Right Ankle Plantar Flexion 3-/5    Right Ankle Inversion 3-/5    Right Ankle Eversion 3-/5      Palpation   Palpation comment hypersensitive to touch medial and lateral Rt foot, achilles insertion, heel, Rt midfoot      Ambulation/Gait   Assistive device Other (Comment)   knee scooter   Gait Comments unable to tolerate gait in boot without knee scooter                        Objective measurements  completed on examination: See above findings.       [facility] Adult PT Treatment/Exercise - 09/08/21 0001       Exercises   Exercises Ankle      Modalities   Modalities Vasopneumatic      Vasopneumatic   Number Minutes Vasopneumatic  10 minutes    Vasopnuematic Location  Ankle    Vasopneumatic Pressure Low    Vasopneumatic Temperature  34      Ankle Exercises: Standing   Other Standing Ankle Exercises lateral wt shifts x 1 min, A/P wt shifts 1 min      Ankle Exercises: Seated   Heel Raises 10 reps    Heel Raises Limitations pain free range    Toe Raise 10 reps    Toe Raise Limitations pain free range    Other Seated Ankle Exercises seated towel inversion/eversion    Other Seated Ankle Exercises LAQ x 10 Rt LE                     PT Education - 09/08/21 1208     Education Details PT POC and goals, HEP, desensitization    Person(s) Educated Patient    Methods Explanation;Demonstration;Handout    Comprehension Verbalized understanding                 PT Long Term Goals - 09/08/21 1229       PT LONG TERM GOAL #1   Title Pt will be independent with HEP    Time 6    Period Weeks    Status New    Target Date 10/20/21      PT LONG TERM GOAL #2   Title Pt will improve FOTO to >=52 to demo improved functional mobility    Time 6    Period Weeks    Status New    Target Date 10/20/21      PT LONG TERM GOAL #3   Title Pt will tolerate gait x 50' without AD with pain <= 3/10 to improve household mobility  Time 6    Period Weeks    Status New    Target Date 10/20/21      PT LONG TERM GOAL #4   Title Pt will improve Rt ankle strength to 4/5 to tolerate gait and mobility    Time 6    Period Weeks    Status New    Target Date 10/20/21                    Plan - 09/08/21 1224     Clinical Impression Statement Pt is a 60 y/o female referred s/p Rt achilles repair 07/02/2021. She presents with hypersensitivity and increased pain, decreased  strength and ROM, difficulty walking and impaired gait and transfers. Pt will benefit from skilled PT to address deficits and improve functional mobility.    Personal Factors and Comorbidities Comorbidity 2;Past/Current Experience;Time since onset of injury/illness/exacerbation    Examination-Activity Limitations Locomotion Level;Stand;Stairs;Transfers    Examination-Participation Restrictions Driving;Community Activity;Meal Prep;Cleaning    Stability/Clinical Decision Making Evolving/Moderate complexity    Clinical Decision Making Moderate    Rehab Potential Good    PT Frequency 2x / week    PT Duration 6 weeks    PT Treatment/Interventions Aquatic Therapy;Cryotherapy;Moist Heat;Iontophoresis 4mg /ml Dexamethasone;Electrical Stimulation;Gait training;Stair training;Functional mobility training;Neuromuscular re-education;Balance training;Therapeutic exercise;Therapeutic activities;Patient/family education;Manual techniques;Dry needling;Taping;Vasopneumatic Device    PT Next Visit Plan assess HEP, progress ankle strength and ROM as tolerated, wt bearing if tolerated    PT Home Exercise Plan [email or web address]    Consulted and Agree with Plan of Care Patient             Patient will benefit from skilled therapeutic intervention in order to improve the following deficits and impairments:  Pain, Decreased strength, Decreased activity tolerance, Decreased range of motion, Increased edema, Impaired sensation, Difficulty walking, Abnormal gait, Decreased balance, Decreased mobility  Visit Diagnosis: Pain in right ankle and joints of right foot - Plan: PT plan of care cert/re-cert  Other abnormalities of gait and mobility - Plan: PT plan of care cert/re-cert  Difficulty in walking, not elsewhere classified - Plan: PT plan of care cert/re-cert  Muscle weakness (generalized) - Plan: PT plan of care cert/re-cert     Problem List Patient Active Problem List   Diagnosis Date Noted   Numbness and  tingling in left hand 09/02/2021   Gastroesophageal reflux disease 07/28/2021   Intrinsic atopic dermatitis 12/01/2020   Dysfunction of both eustachian tubes 12/01/2020   Pruritic rash 02/12/2020   Allergic conjunctivitis of both eyes 02/12/2020   Seasonal allergic rhinitis with a nonallergic component 12/25/2019   Allergic conjunctivitis 12/25/2019   Moderate persistent asthma without complication 12/25/2019    Stryker Veasey, PT 09/08/2021, 12:36 PM  [facility] [place] [address] [place], [place], [place] Phone: [phone]   Fax:  [phone]  Name: Laura Fields MRN: [name] Date of Birth: [birth year]

## 2021-09-10 ENCOUNTER — Ambulatory Visit (INDEPENDENT_AMBULATORY_CARE_PROVIDER_SITE_OTHER): Payer: 59 | Admitting: Physical Medicine and Rehabilitation

## 2021-09-10 ENCOUNTER — Other Ambulatory Visit: Payer: Self-pay

## 2021-09-10 ENCOUNTER — Encounter: Payer: Self-pay | Admitting: Physical Medicine and Rehabilitation

## 2021-09-10 DIAGNOSIS — R202 Paresthesia of skin: Secondary | ICD-10-CM | POA: Diagnosis not present

## 2021-09-10 NOTE — Progress Notes (Signed)
Tingling in fingers of left hand. Second and third fingers are worse. Pain in left forearm up to elbow. Sometimes had swelling in anterior left wrist. Right hand dominant No lotion per patient

## 2021-09-13 ENCOUNTER — Ambulatory Visit (INDEPENDENT_AMBULATORY_CARE_PROVIDER_SITE_OTHER): Payer: 59 | Admitting: Physical Therapy

## 2021-09-13 ENCOUNTER — Other Ambulatory Visit: Payer: Self-pay

## 2021-09-13 ENCOUNTER — Telehealth: Payer: Self-pay

## 2021-09-13 DIAGNOSIS — M25571 Pain in right ankle and joints of right foot: Secondary | ICD-10-CM

## 2021-09-13 DIAGNOSIS — R2689 Other abnormalities of gait and mobility: Secondary | ICD-10-CM | POA: Diagnosis not present

## 2021-09-13 DIAGNOSIS — R262 Difficulty in walking, not elsewhere classified: Secondary | ICD-10-CM

## 2021-09-13 DIAGNOSIS — R053 Chronic cough: Secondary | ICD-10-CM

## 2021-09-13 DIAGNOSIS — M6281 Muscle weakness (generalized): Secondary | ICD-10-CM | POA: Diagnosis not present

## 2021-09-13 NOTE — Telephone Encounter (Signed)
Can you please order a stat 2 view chest xray for this patient. Please have her continue to use albuterol followed by Symbicort twice a day as well as Spiriva daily. Continue nasal saline, nasal steroids and carbinoxamine 8 mg twice a day. She can have tessalon Perles 200 mg three times a day. This may help decrease cough. We will call as soon as we see the cxr report and treat appropriately. Thank you

## 2021-09-13 NOTE — Therapy (Signed)
[facility] [place] [address] [place], [place], [address] Phone: [phone]   Fax:  [phone]  Physical Therapy Treatment  Patient Details  Name: Laura Fields MRN: [id number] Date of Birth: [birth year] Referring Provider (PT): Hyatt,Max   Encounter Date: 09/13/2021   PT End of Session - 09/13/21 1034     Visit Number 2    Number of Visits 12    Date for PT Re-Evaluation 10/20/21    PT Start Time 1000    PT Stop Time 1045    PT Time Calculation (min) 45 min    Activity Tolerance Patient tolerated treatment well    Behavior During Therapy [facility] for tasks assessed/performed             Past Medical History:  Diagnosis Date   Asthma    Intrinsic atopic dermatitis 12/01/2020    Past Surgical History:  Procedure Laterality Date   ABDOMINAL HYSTERECTOMY     AUGMENTATION MAMMAPLASTY Bilateral 1999   Saline/ under muscle   back nerve     block   COLONOSCOPY  09/23/2013   [facility]   GASTRIC BYPASS     KNEE ARTHROSCOPY     mass removable     on thumb   STOMACH SURGERY      There were no vitals filed for this visit.   Subjective Assessment - 09/13/21 1003     Subjective Pt states she has been trying to walk with the boot instead of using knee scooter a little bit around the house    Patient Stated Goals be able to walk and wt bear Rt foot/ankle    Currently in Pain? Yes    Pain Score 8     Pain Location Ankle    Pain Orientation Right    Pain Descriptors / Indicators Sharp    Pain Type Surgical pain                [facility] PT Assessment - 09/13/21 0001       Assessment   Medical Diagnosis Achilles repair    Referring Provider (PT) Hyatt,Max    Onset Date/Surgical Date 07/02/21    Next MD Visit 09/24/21    Prior Therapy none      Sensation   Additional Comments hypersensitive Rt foot and ankle                           [facility] Adult PT Treatment/Exercise - 09/13/21 0001        Ambulation/Gait   Assistive device Other (Comment)   knee scooter     Vasopneumatic   Number Minutes Vasopneumatic  10 minutes    Vasopnuematic Location  Ankle    Vasopneumatic Pressure Low    Vasopneumatic Temperature  34      Manual Therapy   Manual Therapy Passive ROM    Manual therapy comments desensitization with light pressure and light touch    Passive ROM Rt ankle PROM as tolerated      Ankle Exercises: Aerobic   Nustep L5 x 5 min for warm up with boot on      Ankle Exercises: Seated   Towel Crunch 3 reps    Heel Raises --   only able to tolerate x 2 reps due to spasms   Toe Raise 10 reps    Toe Raise Limitations pain free range    Other Seated Ankle Exercises seated towel inversion/eversion    Other  Seated Ankle Exercises LAQ x 10 Rt LE      Ankle Exercises: Stretches   Other Stretch seated ankle DF stretch to tolerance      Ankle Exercises: Standing   Other Standing Ankle Exercises lateral wt shifts x 1 min, A/P wt shifts 1 min   no shoe or boot                         PT Long Term Goals - 09/08/21 1229       PT LONG TERM GOAL #1   Title Pt will be independent with HEP    Time 6    Period Weeks    Status New    Target Date 10/20/21      PT LONG TERM GOAL #2   Title Pt will improve FOTO to >=52 to demo improved functional mobility    Time 6    Period Weeks    Status New    Target Date 10/20/21      PT LONG TERM GOAL #3   Title Pt will tolerate gait x 50' without AD with pain <= 3/10 to improve household mobility    Time 6    Period Weeks    Status New    Target Date 10/20/21      PT LONG TERM GOAL #4   Title Pt will improve Rt ankle strength to 4/5 to tolerate gait and mobility    Time 6    Period Weeks    Status New    Target Date 10/20/21                   Plan - 09/13/21 1034     Clinical Impression Statement Pt continues to be limited by pain, mm spasms and hypersensitivity in Rt ankle. She is able to  tolerate gentle PROM and AROM. Improving tolerance to standing wt shifts    PT Next Visit Plan progress ankle strength and ROM as tolerated, wt bearing if tolerated    PT Home Exercise Plan [id number]    Consulted and Agree with Plan of Care Patient             Patient will benefit from skilled therapeutic intervention in order to improve the following deficits and impairments:     Visit Diagnosis: Pain in right ankle and joints of right foot  Other abnormalities of gait and mobility  Difficulty in walking, not elsewhere classified  Muscle weakness (generalized)     Problem List Patient Active Problem List   Diagnosis Date Noted   Numbness and tingling in left hand 09/02/2021   Gastroesophageal reflux disease 07/28/2021   Intrinsic atopic dermatitis 12/01/2020   Dysfunction of both eustachian tubes 12/01/2020   Pruritic rash 02/12/2020   Allergic conjunctivitis of both eyes 02/12/2020   Seasonal allergic rhinitis with a nonallergic component 12/25/2019   Allergic conjunctivitis 12/25/2019   Moderate persistent asthma without complication 12/25/2019    Sora Vrooman, PT 09/13/2021, 10:36 AM  [facility] [address] [place], [place], [address] Phone: [phone]   Fax:  [phone]  Name: Elsye Mccollister MRN: [id number] Date of Birth: 1961-06-04

## 2021-09-13 NOTE — Telephone Encounter (Signed)
Thank you :)

## 2021-09-13 NOTE — Addendum Note (Signed)
Addended by: Florence Canner on: 09/13/2021 05:28 PM   Modules accepted: Orders

## 2021-09-13 NOTE — Telephone Encounter (Signed)
Patient called stating her cough in getting worse, nose is bleeding when she blows. She was seen last week in the office. Taking cough medicine but it's not helping. Please advise  [phone] Cyla

## 2021-09-13 NOTE — Addendum Note (Signed)
Addended by: Florence Canner on: 09/13/2021 04:58 PM   Modules accepted: Orders

## 2021-09-13 NOTE — Telephone Encounter (Signed)
Spoke to patient and informed her of the chest x-ray needed. She is going tot [employer] tomorrow morning.  Laura Fields [phone]

## 2021-09-14 ENCOUNTER — Ambulatory Visit (HOSPITAL_BASED_OUTPATIENT_CLINIC_OR_DEPARTMENT_OTHER)
Admission: RE | Admit: 2021-09-14 | Discharge: 2021-09-14 | Disposition: A | Discharge status: Home / Self Care | Payer: 59 | Source: Ambulatory Visit | Attending: Family Medicine | Admitting: Family Medicine

## 2021-09-14 ENCOUNTER — Other Ambulatory Visit: Payer: Self-pay | Admitting: Family Medicine

## 2021-09-14 ENCOUNTER — Other Ambulatory Visit: Payer: Self-pay | Admitting: Podiatry

## 2021-09-14 ENCOUNTER — Other Ambulatory Visit: Payer: Self-pay

## 2021-09-14 DIAGNOSIS — R053 Chronic cough: Secondary | ICD-10-CM | POA: Diagnosis not present

## 2021-09-14 IMAGING — DX DG CHEST 2V
2 series · 2 of 2 positions shown · non-contrast
Comparison: None.

CLINICAL DATA: Chronic cough

EXAM:
CHEST - 2 VIEW

[chest pa]
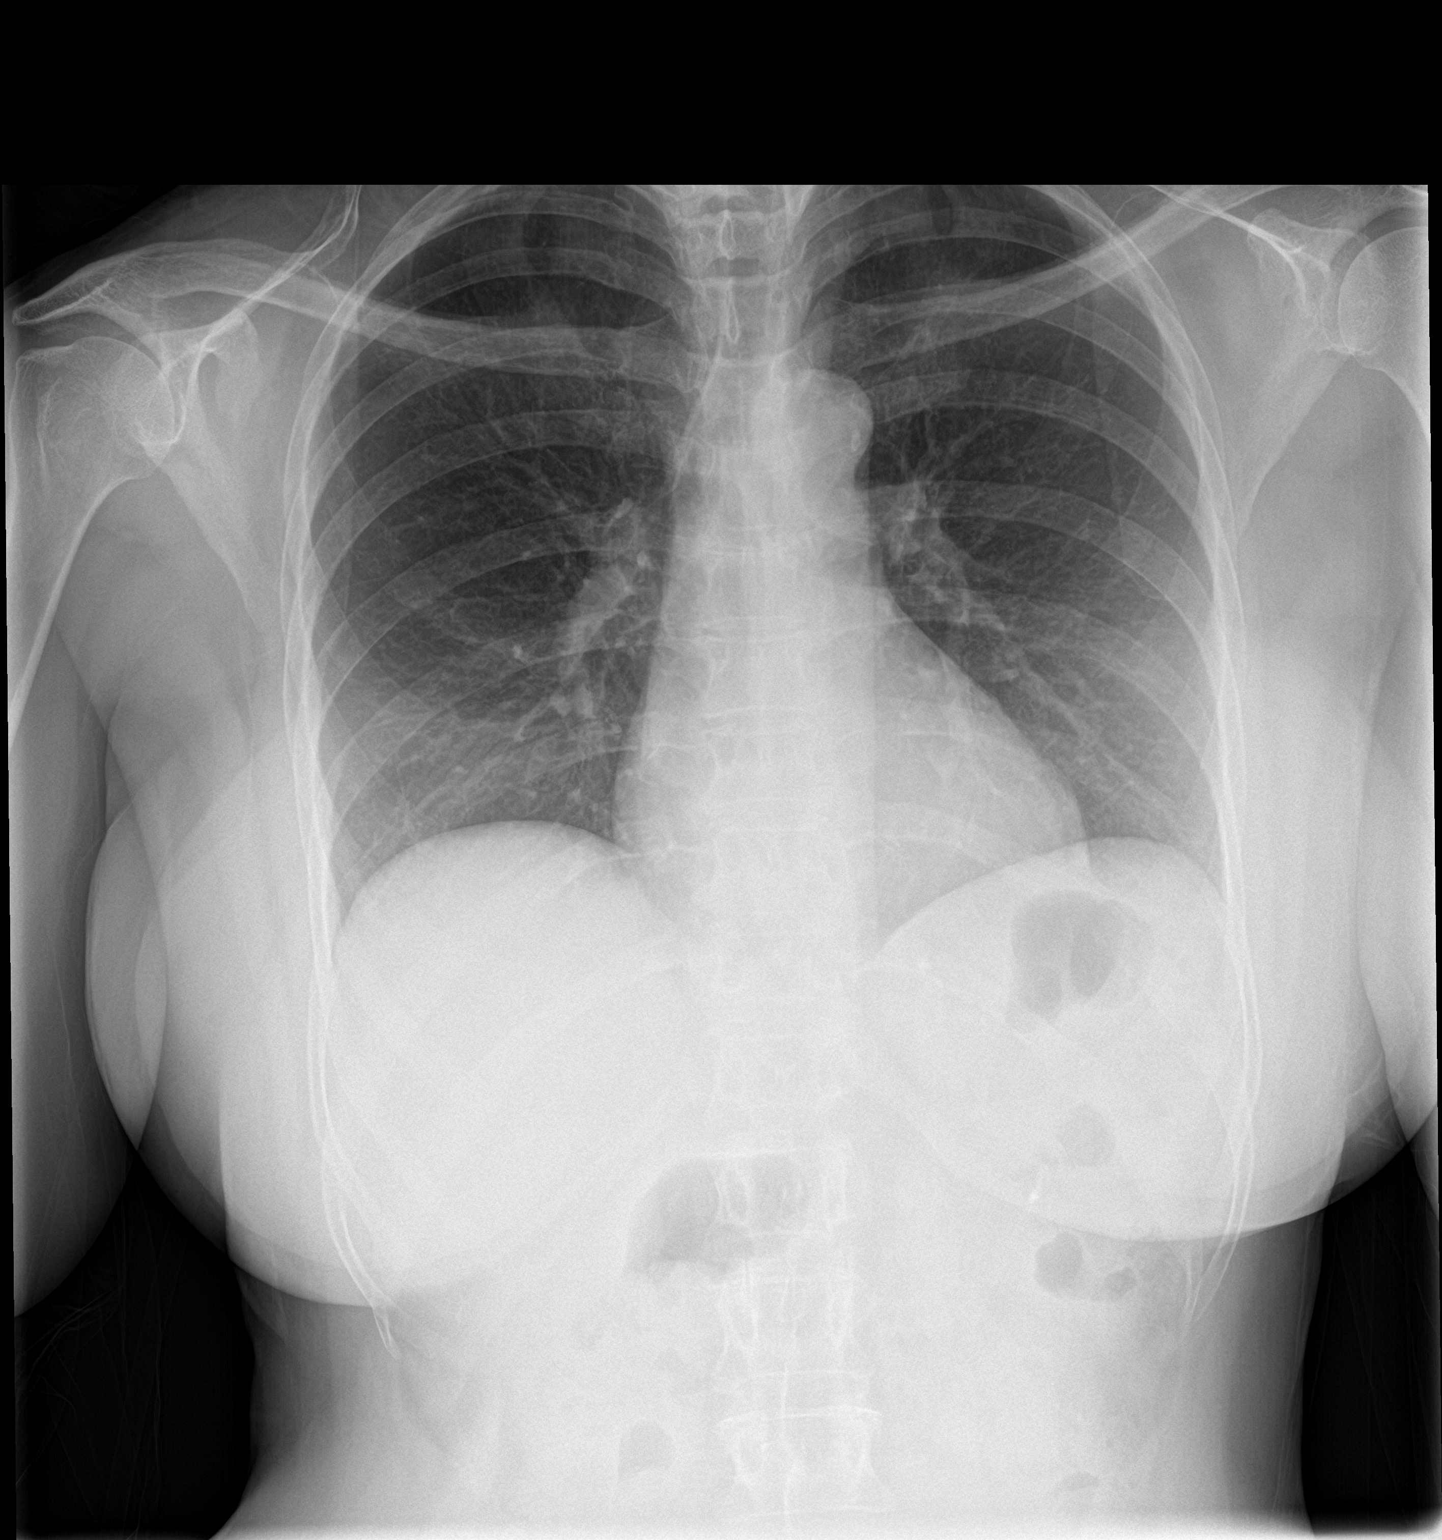

[chest lat]
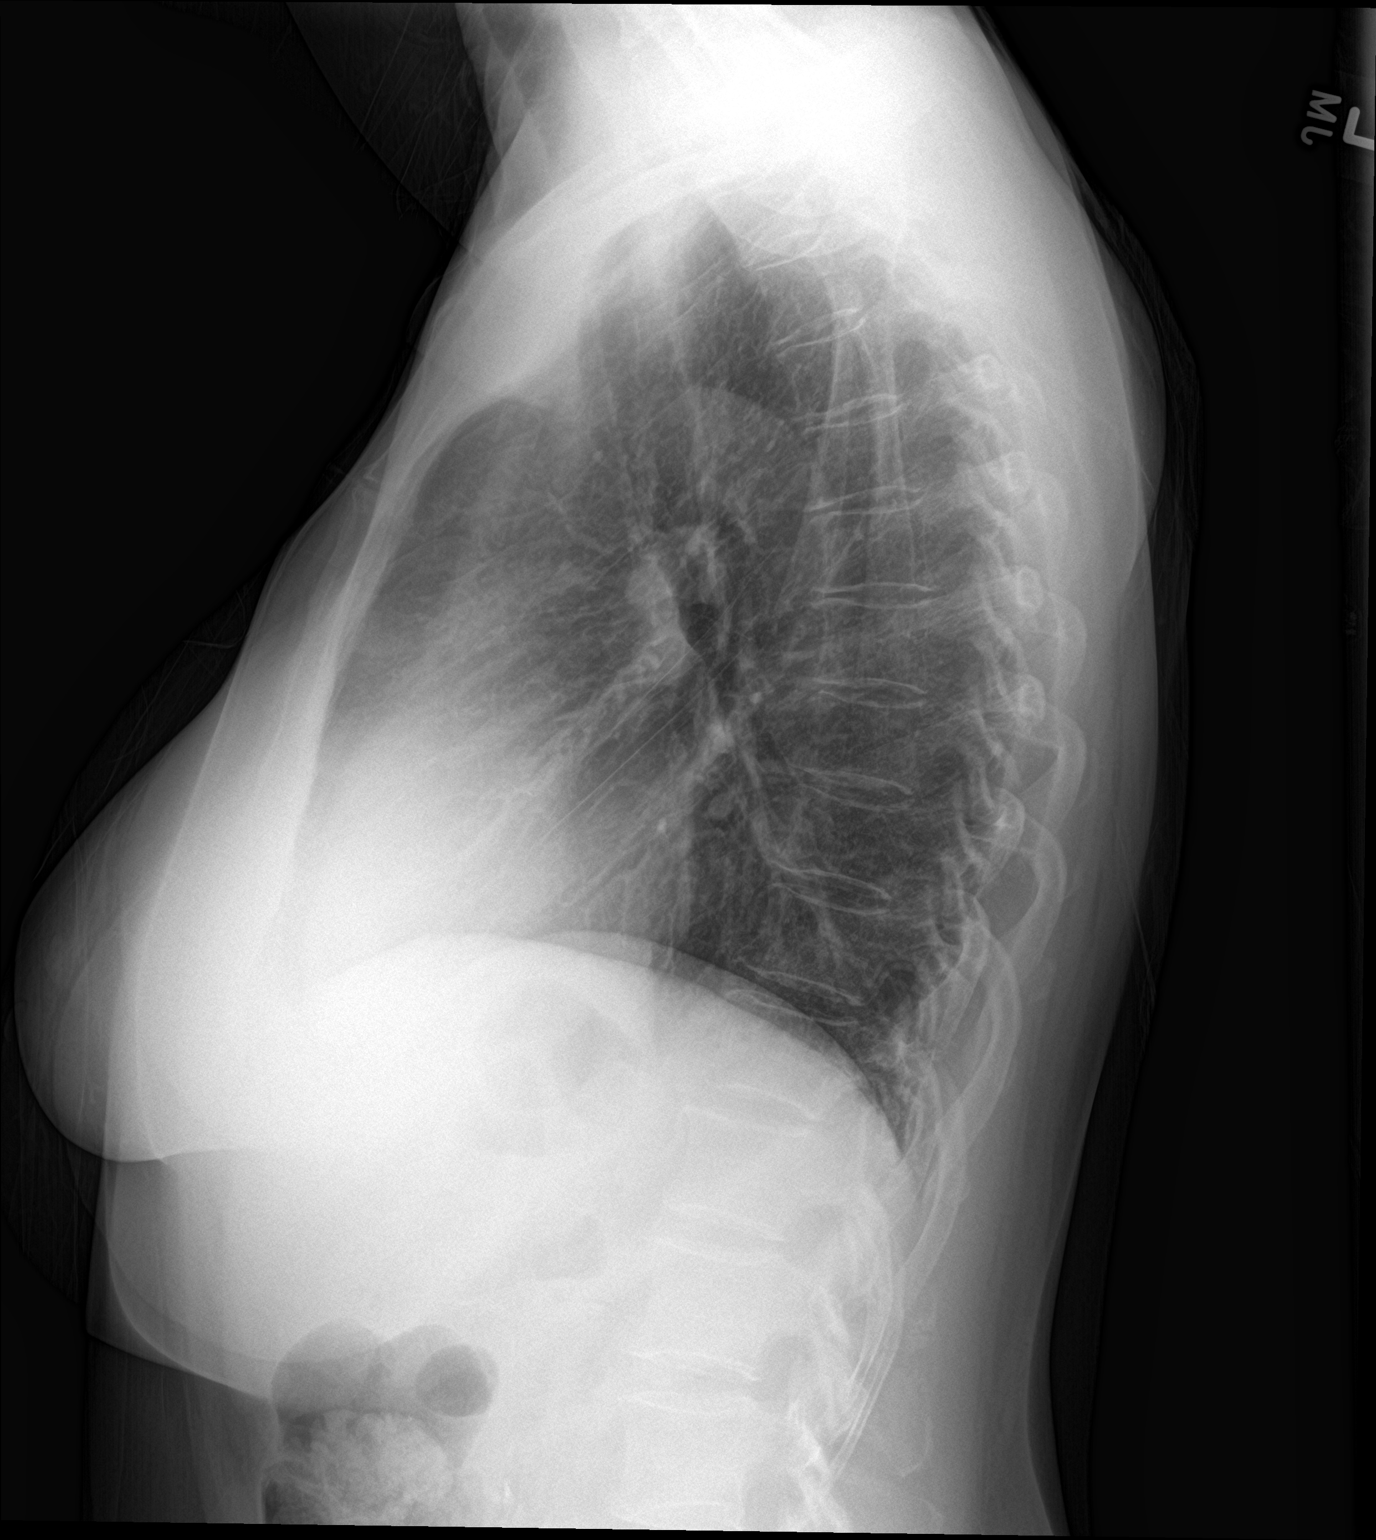

[2 of 2 positions shown; findings below may reference images not displayed]

FINDINGS: The heart size and mediastinal contours are within normal limits.
Both lungs are clear. The visualized skeletal structures are
unremarkable.
IMPRESSION: No active cardiopulmonary disease.

## 2021-09-14 MED ORDER — PREDNISONE 10 MG PO TABS: ORAL_TABLET | ORAL | 0 refills | Status: DC

## 2021-09-14 MED ORDER — BENZONATATE 100 MG PO CAPS: 200.0000 mg | ORAL_CAPSULE | Freq: Three times a day (TID) | ORAL | 0 refills | Status: DC | PRN

## 2021-09-14 NOTE — Progress Notes (Signed)
Can you please let this patient know that her chest xray was normal. Please have her continue with the treatment plan as listed in her last AVS. Please call in tessalon Perles 200 mg up to three times a day as needed for cough as well as Begin prednisone 10 mg tablets 2 tablets twice a day for 3 days, then take 2 tablets once a day for 1 day, then take 1 tablet on the 5th day, then stop. Have her continue nasal saline gel and stop Flonase or nasal steroid if she is still having bleeding from her nose. Continue nasal saline rinses several times a day at this time.  Please have her call with any questions. Thank you

## 2021-09-14 NOTE — Telephone Encounter (Signed)
Please advise 

## 2021-09-14 NOTE — Telephone Encounter (Signed)
Result was given to pt, and pt verbalized understanding. Medication sent to the pharmacy.

## 2021-09-14 NOTE — Progress Notes (Signed)
Laura Fields - 60 y.o. female MRN [id number]  Date of birth: 08-16-1961  Office Visit Note: Visit Date: 09/10/2021 PCP: Olive Bass, FNP Referred by: Olive Bass,*  Subjective: Chief Complaint  Patient presents with   Left Hand - Numbness   Left Elbow - Pain   HPI:  Laura Fields is a 60 y.o. female who comes in today at the request of Dr. Waylan Rocher for electrodiagnostic study of the Left upper extremities.  Patient is Right hand dominant.  She describes tingling and numbness in the left hand in particular the second and third digits.  She gets pain in the left forearm up to the elbow.  She also endorses some hand swelling and anterior left wrist pain.  She has worsening with movement of the hands.  Not specific for nocturnal complaints.  No frank radicular symptoms.  ROS Otherwise per HPI.  Assessment & Plan: Visit Diagnoses:    ICD-10-CM   1. Paresthesia of skin  R20.2 NCV with EMG (electromyography)      Plan: Impression: Essentially NORMAL electrodiagnostic study of the left upper limb.  There is no significant electrodiagnostic evidence of nerve entrapment, brachial plexopathy or cervical radiculopathy.    As you know, purely sensory or demyelinating radiculopathies and chemical radiculitis may not be detected with this particular electrodiagnostic study. **This electrodiagnostic study cannot rule out small fiber polyneuropathy and dysesthesias from central pain syndromes such as stroke or central pain sensitization syndromes such as fibromyalgia.  Myotomal referral pain from trigger points is also not excluded.  Recommendations: 1.  Follow-up with referring physician. 2.  Continue current management of symptoms.  Meds & Orders: No orders of the defined types were placed in this encounter.   Orders Placed This Encounter  Procedures   NCV with EMG (electromyography)    Follow-up: No follow-ups on file.   Procedures: No procedures performed   EMG & NCV Findings: Evaluation of the left median (across palm) sensory nerve showed prolonged distal peak latency (Palm, 2.1 ms).  All remaining nerves (as indicated in the following tables) were within normal limits.    All examined muscles (as indicated in the following table) showed no evidence of electrical instability.    Impression: Essentially NORMAL electrodiagnostic study of the left upper limb.  There is no significant electrodiagnostic evidence of nerve entrapment, brachial plexopathy or cervical radiculopathy.    As you know, purely sensory or demyelinating radiculopathies and chemical radiculitis may not be detected with this particular electrodiagnostic study. **This electrodiagnostic study cannot rule out small fiber polyneuropathy and dysesthesias from central pain syndromes such as stroke or central pain sensitization syndromes such as fibromyalgia.  Myotomal referral pain from trigger points is also not excluded.  Recommendations: 1.  Follow-up with referring physician. 2.  Continue current management of symptoms.  ___________________________ Naaman Plummer FAAPMR Board Certified, American Board of Physical Medicine and Rehabilitation    Nerve Conduction Studies Anti Sensory Summary Table   Stim Site NR Peak (ms) Norm Peak (ms) P-T Amp (V) Norm P-T Amp Site1 Site2 Delta-P (ms) Dist (cm) Vel (m/s) Norm Vel (m/s)  Left Median Acr Palm Anti Sensory (2nd Digit)  30C  Wrist    3.6 <3.6 50.2 >10 Wrist Palm 1.5 0.0    Palm    *2.1 <2.0 59.4         Left Radial Anti Sensory (Base 1st Digit)  29.9C  Wrist    2.4 <3.1 44.3  Wrist Base 1st Digit 2.4 0.0  Left Ulnar Anti Sensory (5th Digit)  30.2C  Wrist    3.7 <3.7 36.5 >15.0 Wrist 5th Digit 3.7 14.0 38 >38   Motor Summary Table   Stim Site NR Onset (ms) Norm Onset (ms) O-P Amp (mV) Norm O-P Amp Site1 Site2 Delta-0 (ms) Dist (cm) Vel (m/s) Norm Vel (m/s)  Left Median Motor (Abd Poll Brev)  30C  Wrist    3.4 <4.2 6.0  >5 Elbow Wrist 4.2 23.5 56 >50  Elbow    7.6  6.3         Left Ulnar Motor (Abd Dig Min)  30.1C  Wrist    3.0 <4.2 7.5 >3 B Elbow Wrist 4.1 22.0 54 >53  B Elbow    7.1  7.0  A Elbow B Elbow 1.3 10.0 77 >53  A Elbow    8.4  6.9          EMG   Side Muscle Nerve Root Ins Act Fibs Psw Amp Dur Poly Recrt Int Dennie Bible Comment  Left Abd Poll Brev Median C8-T1 Nml Nml Nml Nml Nml 0 Nml Nml   Left 1stDorInt Ulnar C8-T1 Nml Nml Nml Nml Nml 0 Nml Nml   Left PronatorTeres Median C6-7 Nml Nml Nml Nml Nml 0 Nml Nml   Left Biceps Musculocut C5-6 Nml Nml Nml Nml Nml 0 Nml Nml   Left Deltoid Axillary C5-6 Nml Nml Nml Nml Nml 0 Nml Nml     Nerve Conduction Studies Anti Sensory Left/Right Comparison   Stim Site L Lat (ms) R Lat (ms) L-R Lat (ms) L Amp (V) R Amp (V) L-R Amp (%) Site1 Site2 L Vel (m/s) R Vel (m/s) L-R Vel (m/s)  Median Acr Palm Anti Sensory (2nd Digit)  30C  Wrist 3.6   50.2   Wrist Palm     Palm *2.1   59.4         Radial Anti Sensory (Base 1st Digit)  29.9C  Wrist 2.4   44.3   Wrist Base 1st Digit     Ulnar Anti Sensory (5th Digit)  30.2C  Wrist 3.7   36.5   Wrist 5th Digit 38     Motor Left/Right Comparison   Stim Site L Lat (ms) R Lat (ms) L-R Lat (ms) L Amp (mV) R Amp (mV) L-R Amp (%) Site1 Site2 L Vel (m/s) R Vel (m/s) L-R Vel (m/s)  Median Motor (Abd Poll Brev)  30C  Wrist 3.4   6.0   Elbow Wrist 56    Elbow 7.6   6.3         Ulnar Motor (Abd Dig Min)  30.1C  Wrist 3.0   7.5   B Elbow Wrist 54    B Elbow 7.1   7.0   A Elbow B Elbow 77    A Elbow 8.4   6.9            Waveforms:            Clinical History: No specialty comments available.     Objective:  VS:  HT:    WT:   BMI:     BP:   HR: bpm  TEMP: ( )  RESP:  Physical Exam Musculoskeletal:        General: No swelling, tenderness or deformity.     Comments: Inspection reveals no atrophy of the bilateral APB or FDI or hand intrinsics. There is no swelling, color changes, allodynia or dystrophic  changes. There is 5 out of  5 strength in the bilateral wrist extension, finger abduction and long finger flexion. There is intact sensation to light touch in all dermatomal and peripheral nerve distributions.  There is a negative Hoffmann's test bilaterally.  Skin:    General: Skin is warm and dry.     Findings: No erythema or rash.  Neurological:     General: No focal deficit present.     Mental Status: She is alert and oriented to person, place, and time.     Motor: No weakness or abnormal muscle tone.     Coordination: Coordination normal.  Psychiatric:        Mood and Affect: Mood normal.        Behavior: Behavior normal.     Imaging: No results found.

## 2021-09-14 NOTE — Procedures (Signed)
EMG & NCV Findings: Evaluation of the left median (across palm) sensory nerve showed prolonged distal peak latency (Palm, 2.1 ms).  All remaining nerves (as indicated in the following tables) were within normal limits.    All examined muscles (as indicated in the following table) showed no evidence of electrical instability.    Impression: Essentially NORMAL electrodiagnostic study of the left upper limb.  There is no significant electrodiagnostic evidence of nerve entrapment, brachial plexopathy or cervical radiculopathy.    As you know, purely sensory or demyelinating radiculopathies and chemical radiculitis may not be detected with this particular electrodiagnostic study. **This electrodiagnostic study cannot rule out small fiber polyneuropathy and dysesthesias from central pain syndromes such as stroke or central pain sensitization syndromes such as fibromyalgia.  Myotomal referral pain from trigger points is also not excluded.  Recommendations: 1.  Follow-up with referring physician. 2.  Continue current management of symptoms.  ___________________________ Naaman Plummer FAAPMR Board Certified, American Board of Physical Medicine and Rehabilitation    Nerve Conduction Studies Anti Sensory Summary Table   Stim Site NR Peak (ms) Norm Peak (ms) P-T Amp (V) Norm P-T Amp Site1 Site2 Delta-P (ms) Dist (cm) Vel (m/s) Norm Vel (m/s)  Left Median Acr Palm Anti Sensory (2nd Digit)  30C  Wrist    3.6 <3.6 50.2 >10 Wrist Palm 1.5 0.0    Palm    *2.1 <2.0 59.4         Left Radial Anti Sensory (Base 1st Digit)  29.9C  Wrist    2.4 <3.1 44.3  Wrist Base 1st Digit 2.4 0.0    Left Ulnar Anti Sensory (5th Digit)  30.2C  Wrist    3.7 <3.7 36.5 >15.0 Wrist 5th Digit 3.7 14.0 38 >38   Motor Summary Table   Stim Site NR Onset (ms) Norm Onset (ms) O-P Amp (mV) Norm O-P Amp Site1 Site2 Delta-0 (ms) Dist (cm) Vel (m/s) Norm Vel (m/s)  Left Median Motor (Abd Poll Brev)  30C  Wrist    3.4 <4.2 6.0 >5  Elbow Wrist 4.2 23.5 56 >50  Elbow    7.6  6.3         Left Ulnar Motor (Abd Dig Min)  30.1C  Wrist    3.0 <4.2 7.5 >3 B Elbow Wrist 4.1 22.0 54 >53  B Elbow    7.1  7.0  A Elbow B Elbow 1.3 10.0 77 >53  A Elbow    8.4  6.9          EMG   Side Muscle Nerve Root Ins Act Fibs Psw Amp Dur Poly Recrt Int Dennie Bible Comment  Left Abd Poll Brev Median C8-T1 Nml Nml Nml Nml Nml 0 Nml Nml   Left 1stDorInt Ulnar C8-T1 Nml Nml Nml Nml Nml 0 Nml Nml   Left PronatorTeres Median C6-7 Nml Nml Nml Nml Nml 0 Nml Nml   Left Biceps Musculocut C5-6 Nml Nml Nml Nml Nml 0 Nml Nml   Left Deltoid Axillary C5-6 Nml Nml Nml Nml Nml 0 Nml Nml     Nerve Conduction Studies Anti Sensory Left/Right Comparison   Stim Site L Lat (ms) R Lat (ms) L-R Lat (ms) L Amp (V) R Amp (V) L-R Amp (%) Site1 Site2 L Vel (m/s) R Vel (m/s) L-R Vel (m/s)  Median Acr Palm Anti Sensory (2nd Digit)  30C  Wrist 3.6   50.2   Wrist Palm     Palm *2.1   59.4  Radial Anti Sensory (Base 1st Digit)  29.9C  Wrist 2.4   44.3   Wrist Base 1st Digit     Ulnar Anti Sensory (5th Digit)  30.2C  Wrist 3.7   36.5   Wrist 5th Digit 38     Motor Left/Right Comparison   Stim Site L Lat (ms) R Lat (ms) L-R Lat (ms) L Amp (mV) R Amp (mV) L-R Amp (%) Site1 Site2 L Vel (m/s) R Vel (m/s) L-R Vel (m/s)  Median Motor (Abd Poll Brev)  30C  Wrist 3.4   6.0   Elbow Wrist 56    Elbow 7.6   6.3         Ulnar Motor (Abd Dig Min)  30.1C  Wrist 3.0   7.5   B Elbow Wrist 54    B Elbow 7.1   7.0   A Elbow B Elbow 77    A Elbow 8.4   6.9            Waveforms:

## 2021-09-15 NOTE — Telephone Encounter (Signed)
Patient was called yesterday 09/14/21 and medications has been sent in and patient has picked up and started taking. Shondell stated she's starting to feel a little better.

## 2021-09-20 ENCOUNTER — Ambulatory Visit (INDEPENDENT_AMBULATORY_CARE_PROVIDER_SITE_OTHER): Payer: 59 | Admitting: Physical Therapy

## 2021-09-20 ENCOUNTER — Other Ambulatory Visit: Payer: Self-pay

## 2021-09-20 DIAGNOSIS — R2689 Other abnormalities of gait and mobility: Secondary | ICD-10-CM

## 2021-09-20 DIAGNOSIS — M25571 Pain in right ankle and joints of right foot: Secondary | ICD-10-CM | POA: Diagnosis not present

## 2021-09-20 DIAGNOSIS — M6281 Muscle weakness (generalized): Secondary | ICD-10-CM

## 2021-09-20 DIAGNOSIS — R262 Difficulty in walking, not elsewhere classified: Secondary | ICD-10-CM | POA: Diagnosis not present

## 2021-09-20 NOTE — Therapy (Signed)
[facility] [place] [address] [place], [place], [address] Phone: [phone]   Fax:  [phone]  Physical Therapy Treatment  Patient Details  Name: Laura Fields MRN: [id number] Date of Birth: 1961-02-12 Referring Provider (PT): Hyatt,Max   Encounter Date: 09/20/2021   PT End of Session - 09/20/21 1548     Visit Number 3    Number of Visits 12    Date for PT Re-Evaluation 10/20/21    PT Start Time 1055    PT Stop Time 1140    PT Time Calculation (min) 45 min    Activity Tolerance Patient tolerated treatment well    Behavior During Therapy [facility] for tasks assessed/performed             Past Medical History:  Diagnosis Date   Asthma    Intrinsic atopic dermatitis 12/01/2020    Past Surgical History:  Procedure Laterality Date   ABDOMINAL HYSTERECTOMY     AUGMENTATION MAMMAPLASTY Bilateral 1999   Saline/ under muscle   back nerve     block   COLONOSCOPY  09/23/2013   [facility]   GASTRIC BYPASS     KNEE ARTHROSCOPY     mass removable     on thumb   STOMACH SURGERY      There were no vitals filed for this visit.   Subjective Assessment - 09/20/21 1428     Subjective Pt reports she has tried to walk in shoe in her house with cane (in Rt hand) but that her foot swells.  She voices frustration with using the knee scooter and its affects on her lower back.  She states that she has been completing HEP 3x/day.    Patient Stated Goals be able to walk and wt bear Rt foot/ankle    Currently in Pain? Yes    Pain Score 8     Pain Location Ankle    Pain Orientation Right;Posterior    Pain Descriptors / Indicators Sore;Tightness;Spasm    Aggravating Factors  standing, pressure    Pain Relieving Factors ice                [facility] PT Assessment - 09/20/21 0001       Assessment   Medical Diagnosis Achilles repair    Referring Provider (PT) Hyatt,Max    Onset Date/Surgical Date 07/02/21    Next MD Visit  09/30/21    Prior Therapy none      AROM   Right Ankle Dorsiflexion -6    Right Ankle Plantar Flexion 45    Right Ankle Inversion 14    Right Ankle Eversion 8      PROM   Right Ankle Dorsiflexion 0      Flexibility   Soft Tissue Assessment /Muscle Length yes    Quadriceps Rt quad 64              [facility] Adult PT Treatment/Exercise - 09/20/21 0001       Modalities   Modalities --   declined: will ice at home.     Manual Therapy   Manual Therapy Taping    Manual therapy comments Pt given instructions on safe tape removal. 4    Kinesiotex Create Space      Kinesiotix   Create Space I strip of reg Rock tape applied over Rt midcalf (incision) to just under Rt calcaneus, covering incision to assist with desensitization and scar mobility.      Ankle Exercises: Aerobic   Nustep  L4 x 6 min for warm up with boot on      Ankle Exercises: Supine   Other Supine Ankle Exercises Bridge with core engaged x 5 reps;  Rt SLR x 5 (initially with AAROM from therapist) with cues for core engagment.      Ankle Exercises: Seated   Ankle Circles/Pumps Right;AROM;AAROM;5 reps    Heel Raises Both;10 reps    Toe Raise 10 reps;2 seconds    Other Seated Ankle Exercises Rt ankle DF (knee straight) with yellow band x 10 reps    Other Seated Ankle Exercises Rt foot AROM inversion/ eversion x 3 reps;  Rocker board (with bilat feet) into PF/DF x 3 min, range to tolerance      Ankle Exercises: Stretches   Gastroc Stretch 3 reps;30 seconds   RLE, strap assist   Other Stretch Prone Rt quad stretch with strap x 3 reps of 30 sec (limited tolerance)      Ankle Exercises: Standing   Other Standing Ankle Exercises Lateral weight shifts, Rt ant/post weight shifts with support of SPC in Lt hand, with boot on Rt foot                          PT Long Term Goals - 09/08/21 1229       PT LONG TERM GOAL #1   Title Pt will be independent with HEP    Time 6    Period Weeks    Status New     Target Date 10/20/21      PT LONG TERM GOAL #2   Title Pt will improve FOTO to >=52 to demo improved functional mobility    Time 6    Period Weeks    Status New    Target Date 10/20/21      PT LONG TERM GOAL #3   Title Pt will tolerate gait x 50' without AD with pain <= 3/10 to improve household mobility    Time 6    Period Weeks    Status New    Target Date 10/20/21      PT LONG TERM GOAL #4   Title Pt will improve Rt ankle strength to 4/5 to tolerate gait and mobility    Time 6    Period Weeks    Status New    Target Date 10/20/21                   Plan - 09/20/21 1546     Clinical Impression Statement Pt had some spasming into Rt calf and quad with exercises today; resolved with rest.  Improved Rt ankle ROM.  Improving exercise tolerance.  Initiated gait training with SPC for very short distance (with boot on).  Progressing towards goals.    Rehab Potential Good    PT Frequency 2x / week    PT Duration 6 weeks    PT Treatment/Interventions Aquatic Therapy;Cryotherapy;Moist Heat;Iontophoresis 4mg /ml Dexamethasone;Electrical Stimulation;Gait training;Stair training;Functional mobility training;Neuromuscular re-education;Balance training;Therapeutic exercise;Therapeutic activities;Patient/family education;Manual techniques;Dry needling;Taping;Vasopneumatic Device    PT Next Visit Plan progress ankle strength and ROM as tolerated, wt bearing if tolerated    PT Home Exercise Plan [id number]    Consulted and Agree with Plan of Care Patient             Patient will benefit from skilled therapeutic intervention in order to improve the following deficits and impairments:  Pain, Decreased strength, Decreased activity tolerance, Decreased range of motion, Increased  edema, Impaired sensation, Difficulty walking, Abnormal gait, Decreased balance, Decreased mobility  Visit Diagnosis: Pain in right ankle and joints of right foot  Other abnormalities of gait and  mobility  Difficulty in walking, not elsewhere classified  Muscle weakness (generalized)     Problem List Patient Active Problem List   Diagnosis Date Noted   Numbness and tingling in left hand 09/02/2021   Gastroesophageal reflux disease 07/28/2021   Intrinsic atopic dermatitis 12/01/2020   Dysfunction of both eustachian tubes 12/01/2020   Pruritic rash 02/12/2020   Allergic conjunctivitis of both eyes 02/12/2020   Seasonal allergic rhinitis with a nonallergic component 12/25/2019   Allergic conjunctivitis 12/25/2019   Moderate persistent asthma without complication 12/25/2019   Mayer Camel, PTA 09/20/21 3:50 PM  [facility] [place] [address] [place], [place], [address] Phone: [phone]   Fax:  [phone]  Name: Laura Fields MRN: [id number] Date of Birth: 1961/06/21

## 2021-09-22 ENCOUNTER — Ambulatory Visit (INDEPENDENT_AMBULATORY_CARE_PROVIDER_SITE_OTHER): Payer: 59 | Admitting: Gastroenterology

## 2021-09-22 ENCOUNTER — Other Ambulatory Visit: Payer: Self-pay

## 2021-09-22 ENCOUNTER — Encounter: Payer: Self-pay | Admitting: Gastroenterology

## 2021-09-22 VITALS — BP 132/90 | HR 80 | Ht 65.0 in | Wt 174.0 lb

## 2021-09-22 DIAGNOSIS — Z9884 Bariatric surgery status: Secondary | ICD-10-CM

## 2021-09-22 DIAGNOSIS — K581 Irritable bowel syndrome with constipation: Secondary | ICD-10-CM

## 2021-09-22 DIAGNOSIS — R1013 Epigastric pain: Secondary | ICD-10-CM

## 2021-09-22 MED ORDER — POLYETHYLENE GLYCOL 3350 17 GM/SCOOP PO POWD: ORAL | 3 refills | Status: AC

## 2021-09-22 MED ORDER — PANTOPRAZOLE SODIUM 40 MG PO TBEC: 40.0000 mg | DELAYED_RELEASE_TABLET | Freq: Every day | ORAL | 6 refills | Status: DC

## 2021-09-22 MED ORDER — NA SULFATE-K SULFATE-MG SULF 17.5-3.13-1.6 GM/177ML PO SOLN: 1.0000 | Freq: Once | ORAL | 0 refills | Status: AC

## 2021-09-22 NOTE — Progress Notes (Signed)
Chief Complaint:   Referring Provider:  Marrian Salvage,*      ASSESSMENT AND PLAN;   #1. Epi pain. H/O gastric bypass  #2. IBS-C.   #3.  Lower abdo pain with neg CT AP 05/2021   Plan: -Protonix 40mg  po qd #30, 6 refills -EGD/colon 2 day prep  Jan/Feb 2023 -Miralax 17g po qd.   I discussed EGD/Colonoscopy- the indications, risks, alternatives and potential complications including, but not limited to, bleeding, infection, reaction to medication, damage to internal organs, cardiac and/or pulmonary problems, and perforation requiring surgery (1 to 2 in 1000). The possibility that significant findings could be missed was explained. All ? were answered. The patient gives consent to proceed. HPI:    Laura Fields is a 60 y.o. female  H/O Gastric Bypass 2005, asthma, chronic low back pain d/t DJD, osteoarthritis, hysterectomy.  With postprandial epigastric pain associated with belching.  No significant heartburn.  No odynophagia or dysphagia.  She does complain of nausea and occasional vomiting especially after eating.  She has been taking nonsteroidals for osteoarthritis/DJD.  No melena or hematochezia.  No hematemesis.  Longstanding H/O constipation- x over 40 years (had constipation when she was pregnant with her son who is 52 years old currently), with pellet-like stools, abdominal bloating, lower abdominal discomfort.  Lately has increased water intake and has been taking high-fiber diet with resultant BMs 1/day.  She would occasionally have to bend backwards or forwards and perform abdominal massage to have bowel movements.  Is blessed with 14 son (68 year old)-was a large baby.  Patient did have vaginal and rectal tear requiring stitches during childbirth.  Subsequently had hysterectomy due to dysfunctional uterine bleeding.  Seen in ED on 06/11/2021 with acute abdominal pain.  Negative CT Abdo/pelvis as below.  Has significant discharge thru umbilicus.-She is scheduled  to see general surgery for the same.  Weight does fluctuate.   Wt Readings from Last 3 Encounters:  09/22/21 174 lb (78.9 kg)  06/11/21 150 lb (68 kg)  03/16/21 179 lb 6.4 oz (81.4 kg)   Previous GI work-up: Colon 09/2013 ([facility]): neg. Per report-rectosigmoid/sigmoid passage of scope required returning back with abdominal pressure to traverse through this area because of looping secondary to underlying scar tissue from prior surgery.  CT Abdo/pelvis with p.o. and IV contrast 06/11/2021 -No acute abnormality -Fatty liver, mild -Normal gallbladder -S/p gastric bypass, hysterectomy -DJD spine   SH- one son 86 yrs, moved from [facility]   Past Medical History:  Diagnosis Date  . Asthma   . Intrinsic atopic dermatitis 12/01/2020    Past Surgical History:  Procedure Laterality Date  . ABDOMINAL HYSTERECTOMY    . AUGMENTATION MAMMAPLASTY Bilateral 1999   Saline/ under muscle  . back nerve     block  . COLONOSCOPY  09/23/2013   [facility]  . GASTRIC BYPASS    . KNEE ARTHROSCOPY    . mass removable     on thumb  . STOMACH SURGERY      Family History  Problem Relation Age of Onset  . Hypertension Mother   . Allergic rhinitis Neg Hx   . Angioedema Neg Hx   . Asthma Neg Hx   . Eczema Neg Hx   . Immunodeficiency Neg Hx   . Urticaria Neg Hx     Social History   Tobacco Use  . Smoking status: Never    Passive exposure: Yes  . Smokeless tobacco: Never  Vaping Use  . Vaping  Use: Never used  Substance Use Topics  . Alcohol use: Yes  . Drug use: Never    Current Outpatient Medications  Medication Sig Dispense Refill  . albuterol (VENTOLIN HFA) 108 (90 Base) MCG/ACT inhaler INHALE 2 PUFFS INTO THE LUNGS EVERY 4 HOURS AS NEEDED FOR WHEEZE OR FOR SHORTNESS OF BREATH 18 g 1  . baclofen (LIORESAL) 10 MG tablet Take 10 mg by mouth 3 (three) times daily.    . benzonatate (TESSALON) 100 MG capsule Take 2 capsules (200 mg total) by mouth 3 (three) times  daily as needed for cough. 90 capsule 0  . Carbinoxamine Maleate 4 MG TABS TAKE 1 TABLET EVERY 8 HOURS FOR RUNNY NOSE. 270 tablet 1  . cyclobenzaprine (FLEXERIL) 5 MG tablet Take 5 mg by mouth 3 (three) times daily as needed for muscle spasms.    Marland Kitchen desonide (DESOWEN) 0.05 % ointment APPLY 1 APPLICATION TOPICALLY 2 TIMES DAILY AS NEEDED TO RED ITCHY AREAS TO THE FACE. 45 g 1  . DULoxetine (CYMBALTA) 60 MG capsule Take 60 mg by mouth daily.    Marland Kitchen Epinastine HCl 0.05 % ophthalmic solution Apply to eye as needed.    . famotidine (PEPCID) 20 MG tablet TAKE 1 TABLET BY MOUTH TWICE A DAY 180 tablet 0  . flunisolide (NASALIDE) 25 MCG/ACT (0.025%) SOLN 2 SPRAYS PER NOSTRIL 2-3 TIMES PER DAY AS NEEDED FOR STUFFY NOSE. 75 mL 1  . gabapentin (NEURONTIN) 100 MG capsule Take by mouth.    . gabapentin (NEURONTIN) 100 MG capsule Take 1 capsule (100 mg total) by mouth 3 (three) times daily. 90 capsule 3  . hydrOXYzine (ATARAX/VISTARIL) 25 MG tablet Take 1 tablet (25 mg total) by mouth 3 (three) times daily as needed for itching. 60 tablet 0  . meloxicam (MOBIC) 15 MG tablet Take 15 mg by mouth daily.    . montelukast (SINGULAIR) 10 MG tablet TAKE 1 TABLET BY MOUTH EVERYDAY AT BEDTIME 90 tablet 0  . Olopatadine HCl (PATADAY) 0.2 % SOLN Place 1 drop into both eyes daily as needed. 2.5 mL 5  . ondansetron (ZOFRAN) 4 MG tablet Take 1 tablet (4 mg total) by mouth every 8 (eight) hours as needed. 20 tablet 0  . pregabalin (LYRICA) 150 MG capsule Take 150 mg by mouth 2 (two) times daily.    . SYMBICORT 160-4.5 MCG/ACT inhaler INHALE 2 PUFFS INTO THE LUNGS TWICE A DAY 10 g 2  . Tiotropium Bromide Monohydrate (SPIRIVA RESPIMAT) 1.25 MCG/ACT AERS INHALE 2 PUFFS BY MOUTH INTO THE LUNGS DAILY 12 g 1  . AMBULATORY NON FORMULARY MEDICATION Apply 1 application topically at bedtime. Medication Name: Compounded Hydroquinone 8%, Tretinoin 0.025%, Kojic Acid 1%, Niacinamide 4%, Fluocinolone 0.025% Cream ([employer]) 30 g 0  .  Olopatadine HCl 0.6 % SOLN PLACE 2 SPRAYS INTO BOTH NOSTRILS 2 (TWO) TIMES DAILY AS NEEDED. 30.5 g 0   No current facility-administered medications for this visit.    Allergies  Allergen Reactions  . Rocephin [Ceftriaxone Sodium In Dextrose] Hives    Review of Systems:  Constitutional: Denies fever, chills, diaphoresis, appetite change and  has fatigue.  HEENT: Has allergies Respiratory: Denies SOB, DOE, cough, chest tightness,  and wheezing.   Cardiovascular: Denies chest pain, palpitations and leg swelling.  Genitourinary: Denies dysuria, urgency, frequency, hematuria, flank pain and difficulty urinating.  Musculoskeletal: has  myalgias, back pain, joint swelling, arthralgias and gait problem. ?  Raynaud's.  Negative rheumatology work-up. Skin: No rash.  Neurological: Denies dizziness, seizures,  syncope, weakness, light-headedness, numbness and has headaches.  Hematological: Denies adenopathy. Easy bruising, personal or family bleeding history  Psychiatric/Behavioral: Has anxiety, no depression     Physical Exam:    BP 132/90 (BP Location: Right Arm, Patient Position: Sitting, Cuff Size: Normal)   Pulse 80   Ht 5\' 5"  (1.651 m)   Wt 174 lb (78.9 kg)   SpO2 96%   BMI 28.96 kg/m  Wt Readings from Last 3 Encounters:  09/22/21 174 lb (78.9 kg)  06/11/21 150 lb (68 kg)  03/16/21 179 lb 6.4 oz (81.4 kg)   Constitutional:  Well-developed, in no acute distress. Psychiatric: Normal mood and affect. Behavior is normal. HEENT: Pupils normal.  Conjunctivae are normal. No scleral icterus. Cardiovascular: Normal rate, regular rhythm. No edema Pulmonary/chest: Effort normal and breath sounds normal. No wheezing, rales or rhonchi. Abdominal: Soft, nondistended. Nontender. Bowel sounds active throughout. There are no masses palpable. No hepatomegaly.  Umbilicus appears to be normal.  No definite discharge. Rectal: Deferred Neurological: Alert and oriented to person place and time. Skin:  Skin is warm and dry. No rashes noted.  Data Reviewed: I have personally reviewed following labs and imaging studies  CBC: CBC Latest Ref Rng & Units 06/11/2021 09/01/2020  WBC 4.0 - 10.5 K/uL 8.4 6.2  Hemoglobin 12.0 - 15.0 g/dL 13/06/2020 41.2  Hematocrit 87.8 - 46.0 % 40.9 38.5  Platelets 150 - 400 K/uL 408(H) 446    CMP: CMP Latest Ref Rng & Units 06/11/2021  Glucose 70 - 99 mg/dL 06/13/2021)  BUN 6 - 20 mg/dL 5(L)  Creatinine 720(N - 1.00 mg/dL 4.70  Sodium 9.62 - 836 mmol/L 129(L)  Potassium 3.5 - 5.1 mmol/L 3.3(L)  Chloride 98 - 111 mmol/L 99  CO2 22 - 32 mmol/L 19(L)  Calcium 8.9 - 10.3 mg/dL 629)  Total Protein 6.5 - 8.1 g/dL 7.2  Total Bilirubin 0.3 - 1.2 mg/dL 0.8  Alkaline Phos 38 - 126 U/L 69  AST 15 - 41 U/L 21  ALT 0 - 44 U/L 11     Radiology Studies: CT reviewed    4.7(M, MD 09/22/2021, 2:31 PM  Cc: 09/24/2021,*

## 2021-09-22 NOTE — Patient Instructions (Addendum)
If you are age 59 or older, your body mass index should be between 23-30. Your Body mass index is 28.96 kg/m. If this is out of the aforementioned range listed, please consider follow up with your Primary Care Provider.  If you are age 88 or younger, your body mass index should be between 19-25. Your Body mass index is 28.96 kg/m. If this is out of the aformentioned range listed, please consider follow up with your Primary Care Provider.   ________________________________________________________  The [facility] providers would like to encourage you to use [facility] to communicate with providers for non-urgent requests or questions.  Due to long hold times on the telephone, sending your provider a message by [facility] may be a faster and more efficient way to get a response.  Please allow 48 business hours for a response.  Please remember that this is for non-urgent requests.  _______________________________________________________  Bonita Quin have been scheduled for an endoscopy and colonoscopy. Please follow the written instructions given to you at your visit today. Please pick up your prep supplies at the pharmacy within the next 1-3 days. If you use inhalers (even only as needed), please bring them with you on the day of your procedure.  Please purchase the following medications over the counter and take as directed: Miralax 17g in 8oz of water daily  11-28-2021   Two days before your procedure: Mix 3 packs (or capfuls) of Miralax in 48 ounces of clear liquid and drink at 6pm.   We have sent the following medications to your pharmacy for you to pick up at your convenience: Protonix  Please call with any questions or concerns.  Thank you,  Dr. Lynann Bologna

## 2021-09-23 ENCOUNTER — Other Ambulatory Visit: Payer: Self-pay

## 2021-09-23 ENCOUNTER — Ambulatory Visit (INDEPENDENT_AMBULATORY_CARE_PROVIDER_SITE_OTHER): Payer: 59 | Admitting: Physical Therapy

## 2021-09-23 ENCOUNTER — Encounter: Payer: Self-pay | Admitting: Physical Therapy

## 2021-09-23 DIAGNOSIS — M25571 Pain in right ankle and joints of right foot: Secondary | ICD-10-CM

## 2021-09-23 DIAGNOSIS — R2689 Other abnormalities of gait and mobility: Secondary | ICD-10-CM | POA: Diagnosis not present

## 2021-09-23 DIAGNOSIS — R262 Difficulty in walking, not elsewhere classified: Secondary | ICD-10-CM | POA: Diagnosis not present

## 2021-09-23 DIAGNOSIS — M6281 Muscle weakness (generalized): Secondary | ICD-10-CM

## 2021-09-23 NOTE — Therapy (Signed)
[facility] [place] [address] [place], [place], [address] Phone: [phone]   Fax:  [phone]  Physical Therapy Treatment  Patient Details  Name: Laura Fields MRN: [id number] Date of Birth: 03-13-1961 Referring Provider (PT): Hyatt,Max   Encounter Date: 09/23/2021   PT End of Session - 09/23/21 1149     Visit Number 4    Number of Visits 12    Date for PT Re-Evaluation 10/20/21    PT Start Time 1101    PT Stop Time 1145    PT Time Calculation (min) 44 min    Activity Tolerance Patient tolerated treatment well    Behavior During Therapy [facility] for tasks assessed/performed             Past Medical History:  Diagnosis Date   Asthma    Intrinsic atopic dermatitis 12/01/2020    Past Surgical History:  Procedure Laterality Date   ABDOMINAL HYSTERECTOMY     AUGMENTATION MAMMAPLASTY Bilateral 1999   Saline/ under muscle   back nerve     block   COLONOSCOPY  09/23/2013   [facility]   GASTRIC BYPASS     KNEE ARTHROSCOPY     mass removable     on thumb   STOMACH SURGERY      There were no vitals filed for this visit.   Subjective Assessment - 09/23/21 1115     Subjective Pt reports that she was diagnosed with IBS this week. She has been working on ROM and exercises for Rt ankle over the last couple days.    Patient Stated Goals be able to walk and wt bear Rt foot/ankle    Currently in Pain? Yes    Pain Score 9     Pain Location Ankle    Pain Orientation Right;Posterior    Pain Descriptors / Indicators Sore    Aggravating Factors  pressure, walking    Pain Relieving Factors ice                [facility] PT Assessment - 09/23/21 0001       Assessment   Medical Diagnosis Achilles repair    Referring Provider (PT) Hyatt,Max    Onset Date/Surgical Date 07/02/21    Next MD Visit 09/30/21    Prior Therapy none      Flexibility   Quadriceps Rt quad 78               [facility] Adult PT  Treatment/Exercise - 09/23/21 0001       Exercises   Exercises Knee/Hip      Knee/Hip Exercises: Stretches   Quad Stretch Right;4 reps;30 seconds   prone with strap; improved tolerance     Knee/Hip Exercises: Standing   Other Standing Knee Exercises standing weight shifts and short gait ~20 ft with boot and no AD with improved gait quality and cadence.      Knee/Hip Exercises: Supine   Straight Leg Raises Right;1 set;10 reps   no AAROM needed, improved tolerance.     Knee/Hip Exercises: Prone   Hip Extension Strengthening;Right;2 sets;5 reps      Modalities   Modalities --   declined: will ice at home.     Kinesiotix   Create Space I strip of reg Rock tape applied over Rt midcalf (incision) to just under Rt calcaneus, covering incision to assist with desensitization and scar mobility.      Ankle Exercises: Seated   Ankle Circles/Pumps Right;AROM;AAROM;5 reps    BAPS  Sitting;Level 3   CW/CCW, PF/DF   Other Seated Ankle Exercises Rt ankle inversion, eversion, PF, DF with yellow band x 10 each.    Other Seated Ankle Exercises rocker board PF/DF x 15 reps      Ankle Exercises: Aerobic   Nustep L4 x 6 min for warm up with boot off; range to tolerance      Ankle Exercises: Supine   Other Supine Ankle Exercises Bridge with core engaged x 5 reps, 2 sets                          PT Long Term Goals - 09/08/21 1229       PT LONG TERM GOAL #1   Title Pt will be independent with HEP    Time 6    Period Weeks    Status New    Target Date 10/20/21      PT LONG TERM GOAL #2   Title Pt will improve FOTO to >=52 to demo improved functional mobility    Time 6    Period Weeks    Status New    Target Date 10/20/21      PT LONG TERM GOAL #3   Title Pt will tolerate gait x 50' without AD with pain <= 3/10 to improve household mobility    Time 6    Period Weeks    Status New    Target Date 10/20/21      PT LONG TERM GOAL #4   Title Pt will improve Rt ankle  strength to 4/5 to tolerate gait and mobility    Time 6    Period Weeks    Status New    Target Date 10/20/21                   Plan - 09/23/21 1147     Clinical Impression Statement No spasming in Rt calf/quad with exercises today. Overall improved exercise tolerance compared to last visit; no increase in pain.  Pt reported she felt looser and better leaving at end of session; walked out of session in boot vs using the scooter (as she did on way into session).  Progressing well towards goals.    Rehab Potential Good    PT Frequency 2x / week    PT Duration 6 weeks    PT Treatment/Interventions Aquatic Therapy;Cryotherapy;Moist Heat;Iontophoresis 4mg /ml Dexamethasone;Electrical Stimulation;Gait training;Stair training;Functional mobility training;Neuromuscular re-education;Balance training;Therapeutic exercise;Therapeutic activities;Patient/family education;Manual techniques;Dry needling;Taping;Vasopneumatic Device    PT Next Visit Plan progress ankle strength and ROM as tolerated, wt bearing as tolerated.  Gait.  MD note.    PT Home Exercise Plan [id number]    Consulted and Agree with Plan of Care Patient             Patient will benefit from skilled therapeutic intervention in order to improve the following deficits and impairments:  Pain, Decreased strength, Decreased activity tolerance, Decreased range of motion, Increased edema, Impaired sensation, Difficulty walking, Abnormal gait, Decreased balance, Decreased mobility  Visit Diagnosis: Pain in right ankle and joints of right foot  Other abnormalities of gait and mobility  Difficulty in walking, not elsewhere classified  Muscle weakness (generalized)     Problem List Patient Active Problem List   Diagnosis Date Noted   Numbness and tingling in left hand 09/02/2021   Gastroesophageal reflux disease 07/28/2021   Intrinsic atopic dermatitis 12/01/2020   Dysfunction of both eustachian tubes 12/01/2020   Pruritic  rash  02/12/2020   Allergic conjunctivitis of both eyes 02/12/2020   Seasonal allergic rhinitis with a nonallergic component 12/25/2019   Allergic conjunctivitis 12/25/2019   Moderate persistent asthma without complication 12/25/2019   Mayer Camel, PTA 09/23/21 12:51 PM  [facility] [place] [address] [place], [place], [address] Phone: [phone]   Fax:  [phone]  Name: Carleah Yablonski MRN: [id number] Date of Birth: Aug 22, 1961

## 2021-09-24 ENCOUNTER — Encounter: Payer: Self-pay | Admitting: Orthopedic Surgery

## 2021-09-24 ENCOUNTER — Ambulatory Visit (INDEPENDENT_AMBULATORY_CARE_PROVIDER_SITE_OTHER): Payer: 59 | Admitting: Orthopedic Surgery

## 2021-09-24 VITALS — BP 117/83 | HR 79

## 2021-09-24 DIAGNOSIS — R200 Anesthesia of skin: Secondary | ICD-10-CM | POA: Diagnosis not present

## 2021-09-24 DIAGNOSIS — R2231 Localized swelling, mass and lump, right upper limb: Secondary | ICD-10-CM

## 2021-09-24 DIAGNOSIS — R202 Paresthesia of skin: Secondary | ICD-10-CM | POA: Diagnosis not present

## 2021-09-24 NOTE — Progress Notes (Signed)
Office Visit Note   Patient: Laura Fields           Date of Birth: [birth year]           MRN: [id number] Visit Date: 09/24/2021              Requested by: Olive Bass, FNP [address] [place],  [place] [address] PCP: Olive Bass, FNP   Assessment & Plan: Visit Diagnoses:  1. Numbness and tingling in left hand     Plan: Reviewed EMG/NCS results with patient which were normal.  Her biggest complaint today seems to be left hand weakness.  Discussed the role of hand therapy in overall hand strengthening and vague pain relief.  We will start her in hand therapy to work on strengthening. She is also concerned that a cyst has returned in her right thumb pad.  We can get an ultrasound to evaluate for mass recurrence.   Follow-Up Instructions: No follow-ups on file.   Orders:  No orders of the defined types were placed in this encounter.  No orders of the defined types were placed in this encounter.     Procedures: No procedures performed   Clinical Data: No additional findings.   Subjective: Chief Complaint  Patient presents with   Left Hand - Follow-up    This is a 60 year old right-hand-dominant female who presents for follow-up of left hand numbness and weakness.  She was last seen on 11/10 at which time we ordered an EMG/nerve conduction study to evaluate for potential carpal tunnel syndrome.  The EMG/nerve conduction study was normal.  She still describes numbness in her palm.  Her biggest complaint today seems to be weakness in her hand.  She notes that she is been dropping objects and has some difficulty with grip.  She also has vague and poorly localized pain in the wrist and hand.  She takes gabapentin for neuropathic pain with some relief.  She has a history of fibromyalgia.  She also notes that she had a mass excised from the pad of her right thumb sometime ago.  She feel that this mass is recurring.  Feel that she has a callus in  the area but she is very concerned that this is that same mass that was previously excised.   Review of Systems   Objective: Vital Signs: BP 117/83 (BP Location: Right Arm, Patient Position: Sitting, Cuff Size: Normal)   Pulse 79   SpO2 96%   Physical Exam Constitutional:      Appearance: Normal appearance.  Cardiovascular:     Rate and Rhythm: Normal rate.     Pulses: Normal pulses.  Pulmonary:     Effort: Pulmonary effort is normal.  Skin:    General: Skin is warm and dry.     Capillary Refill: Capillary refill takes less than 2 seconds.  Neurological:     Mental Status: She is alert.    Right Hand Exam   Range of Motion  The patient has normal right wrist ROM.   Muscle Strength  The patient has normal right wrist strength.  Other  Erythema: absent Sensation: normal Pulse: present  Comments:  Palpable callus in pad of right thumb in area of previous mass excision.  Feels like callus moreso than discrete mass.    Left Hand Exam   Tenderness  Left hand tenderness location: Tenderness in palm and volar wrist.   Range of Motion  The patient has normal left wrist  ROM.  Other  Erythema: absent Sensation: normal Pulse: present  Comments:  Negative carpal tunnel provocative test.      Specialty Comments:  No specialty comments available.  Imaging: No results found.   PMFS History: Patient Active Problem List   Diagnosis Date Noted   Numbness and tingling in left hand 09/02/2021   Gastroesophageal reflux disease 07/28/2021   Intrinsic atopic dermatitis 12/01/2020   Dysfunction of both eustachian tubes 12/01/2020   Pruritic rash 02/12/2020   Allergic conjunctivitis of both eyes 02/12/2020   Seasonal allergic rhinitis with a nonallergic component 12/25/2019   Allergic conjunctivitis 12/25/2019   Moderate persistent asthma without complication 12/25/2019   Past Medical History:  Diagnosis Date   Asthma    Intrinsic atopic dermatitis 12/01/2020     Family History  Problem Relation Age of Onset   Hypertension Mother    Allergic rhinitis Neg Hx    Angioedema Neg Hx    Asthma Neg Hx    Eczema Neg Hx    Immunodeficiency Neg Hx    Urticaria Neg Hx     Past Surgical History:  Procedure Laterality Date   ABDOMINAL HYSTERECTOMY     AUGMENTATION MAMMAPLASTY Bilateral 1999   Saline/ under muscle   back nerve     block   COLONOSCOPY  09/23/2013   [facility]   GASTRIC BYPASS     KNEE ARTHROSCOPY     mass removable     on thumb   STOMACH SURGERY     Social History   Occupational History   Not on file  Tobacco Use   Smoking status: Never    Passive exposure: Yes   Smokeless tobacco: Never  Vaping Use   Vaping Use: Never used  Substance and Sexual Activity   Alcohol use: Yes   Drug use: Never   Sexual activity: Not on file

## 2021-09-28 ENCOUNTER — Ambulatory Visit (INDEPENDENT_AMBULATORY_CARE_PROVIDER_SITE_OTHER): Payer: 59 | Admitting: Physical Therapy

## 2021-09-28 ENCOUNTER — Other Ambulatory Visit: Payer: Self-pay

## 2021-09-28 ENCOUNTER — Encounter: Payer: Self-pay | Admitting: Physical Therapy

## 2021-09-28 DIAGNOSIS — R2689 Other abnormalities of gait and mobility: Secondary | ICD-10-CM

## 2021-09-28 DIAGNOSIS — M25571 Pain in right ankle and joints of right foot: Secondary | ICD-10-CM | POA: Diagnosis not present

## 2021-09-28 DIAGNOSIS — R262 Difficulty in walking, not elsewhere classified: Secondary | ICD-10-CM

## 2021-09-28 NOTE — Therapy (Signed)
[facility] [place] [address] [place] [address] [place], [place], [address] Phone: [phone]   Fax:  [phone]  Physical Therapy Treatment  Patient Details  Name: Laura Fields MRN: [id number] Date of Birth: [birth year] Referring Provider (PT): Hyatt,Max   Encounter Date: 09/28/2021   PT End of Session - 09/28/21 1154     Visit Number 5    Number of Visits 12    Date for PT Re-Evaluation 10/20/21    PT Start Time 1145    PT Stop Time 1225    PT Time Calculation (min) 40 min             Past Medical History:  Diagnosis Date   Asthma    Intrinsic atopic dermatitis 12/01/2020    Past Surgical History:  Procedure Laterality Date   ABDOMINAL HYSTERECTOMY     AUGMENTATION MAMMAPLASTY Bilateral 1999   Saline/ under muscle   back nerve     block   COLONOSCOPY  09/23/2013   [facility]   GASTRIC BYPASS     KNEE ARTHROSCOPY     mass removable     on thumb   STOMACH SURGERY      There were no vitals filed for this visit.   Subjective Assessment - 09/28/21 1153     Subjective Pt reports she is now walking exclusively around house with the boot ("no scooter or cane").  She is using knee scooter when out in the community.   She states she's been working hard on her exercises and icing her heel once every night.    Currently in Pain? Yes    Pain Score 9     Pain Location Ankle    Pain Orientation Right;Posterior    Pain Descriptors / Indicators Tightness;Sore    Pain Radiating Towards into Rt calf    Aggravating Factors  pressure on heel when standing with bare foot    Pain Relieving Factors ice                [facility] PT Assessment - 09/28/21 0001       Assessment   Medical Diagnosis Achilles repair    Referring Provider (PT) Hyatt,Max    Onset Date/Surgical Date 07/02/21    Next MD Visit 09/30/21    Prior Therapy none      Restrictions   RLE Weight Bearing Weight bearing as tolerated      AROM   Right/Left  Ankle Right    Right Ankle Dorsiflexion 0    Right Ankle Plantar Flexion 46    Right Ankle Inversion 30    Right Ankle Eversion 15      PROM   Right Ankle Dorsiflexion 12      Strength   Right Ankle Dorsiflexion 3+/5    Right Ankle Plantar Flexion 3+/5    Right Ankle Inversion 3+/5    Right Ankle Eversion 3+/5      Flexibility   Quadriceps Rt quad 115              [facility] Adult PT Treatment/Exercise - 09/28/21 0001       Ambulation/Gait   Ambulation Distance (Feet) 80 Feet   one short seated rest break at 40 ft.   Assistive device Rolling walker    Gait Pattern Step-to pattern;Step-through pattern;Decreased stance time - right;Decreased step length - left;Right flexed knee in stance;Antalgic    Gait velocity slowed.    Stairs Yes    Stairs Assistance 5: Supervision  Stairs Assistance Details (indicate cue type and reason) Rt step up, and Rt step down pattern.    Stair Management Technique Step to pattern;Forwards;Two rails    Number of Stairs 5    Height of Stairs --   4"-6"   Gait Comments cues for increased heel strike bilat, and to shorten step length bilat.      Knee/Hip Exercises: Stretches   Lobbyist Right;3 reps;20 seconds   prone with strap   Gastroc Stretch Right;3 reps;20 seconds   seated with strap     Knee/Hip Exercises: Supine   Straight Leg Raises Strengthening;Right;10 reps   3 sec hold     Knee/Hip Exercises: Sidelying   Hip ABduction Strengthening;Right;1 set;10 reps      Knee/Hip Exercises: Prone   Hip Extension Strengthening;Right;5 reps    Other Prone Exercises Rt toes tucked with TKE/quad set      Ankle Exercises: Aerobic   Nustep L4 x 5 min for warm up with boot off; range to tolerance      Ankle Exercises: Supine   Other Supine Ankle Exercises Bridge with core engaged x 5 reps, 2 sets      Ankle Exercises: Seated   Ankle Circles/Pumps Right;AROM;5 reps    Heel Raises Both;15 reps;10 reps    Toe Raise 10 reps    BAPS  Sitting;Level 3   CW/CCW x 10 each   Other Seated Ankle Exercises Rt ankle resisted DF with red band x 10                          PT Long Term Goals - 09/28/21 1240       PT LONG TERM GOAL #1   Title Pt will be independent with HEP    Time 6    Period Weeks    Status On-going    Target Date 10/20/21      PT LONG TERM GOAL #2   Title Pt will improve FOTO to >=52 to demo improved functional mobility    Time 6    Period Weeks    Status On-going    Target Date 10/20/21      PT LONG TERM GOAL #3   Title Pt will tolerate gait x 50' without AD with pain <= 3/10 to improve household mobility    Time 6    Period Weeks    Status On-going    Target Date 10/20/21      PT LONG TERM GOAL #4   Title Pt will improve Rt ankle strength to 4/5 to tolerate gait and mobility    Time 6    Period Weeks    Status On-going    Target Date 10/20/21                   Plan - 09/28/21 1241     Clinical Impression Statement Pt's ankle ROM has improved greatly - now at 0 active DF, 12 passive DF. Rt quad flexibility has improved from 64 on 11/28 to 115 today. Exercise tolerance improving each visit.  She was able to tolerate ambulating 40 ft (x2) with RW and regular shoe with improved gait mechanics.  Pt is progressing well towards goals.    Rehab Potential Good    PT Frequency 2x / week    PT Duration 6 weeks    PT Treatment/Interventions Aquatic Therapy;Cryotherapy;Moist Heat;Iontophoresis 4mg /ml Dexamethasone;Electrical Stimulation;Gait training;Stair training;Functional mobility training;Neuromuscular re-education;Balance training;Therapeutic exercise;Therapeutic activities;Patient/family education;Manual techniques;Dry needling;Taping;Vasopneumatic Device  PT Next Visit Plan progress ankle strength and ROM as tolerated, wt bearing as tolerated.  Gait.    PT Home Exercise Plan [id number]    Consulted and Agree with Plan of Care Patient             Patient will  benefit from skilled therapeutic intervention in order to improve the following deficits and impairments:  Pain, Decreased strength, Decreased activity tolerance, Decreased range of motion, Increased edema, Impaired sensation, Difficulty walking, Abnormal gait, Decreased balance, Decreased mobility  Visit Diagnosis: Pain in right ankle and joints of right foot  Other abnormalities of gait and mobility  Difficulty in walking, not elsewhere classified     Problem List Patient Active Problem List   Diagnosis Date Noted   Numbness and tingling in left hand 09/02/2021   Gastroesophageal reflux disease 07/28/2021   Intrinsic atopic dermatitis 12/01/2020   Dysfunction of both eustachian tubes 12/01/2020   Pruritic rash 02/12/2020   Allergic conjunctivitis of both eyes 02/12/2020   Seasonal allergic rhinitis with a nonallergic component 12/25/2019   Allergic conjunctivitis 12/25/2019   Moderate persistent asthma without complication 12/25/2019   Mayer Camel, PTA 09/28/21 12:49 PM   [facility] [place] [address] [place] [address] [place], [place], [address] Phone: [phone]   Fax:  [phone]  Name: Laura Fields MRN: [id number] Date of Birth: 1961/03/20

## 2021-09-30 ENCOUNTER — Ambulatory Visit (INDEPENDENT_AMBULATORY_CARE_PROVIDER_SITE_OTHER): Payer: 59 | Admitting: Podiatrist

## 2021-09-30 ENCOUNTER — Other Ambulatory Visit: Payer: Self-pay

## 2021-09-30 ENCOUNTER — Encounter: Payer: Self-pay | Admitting: Podiatrist

## 2021-09-30 DIAGNOSIS — M7661 Achilles tendinitis, right leg: Secondary | ICD-10-CM

## 2021-09-30 DIAGNOSIS — Z9889 Other specified postprocedural states: Secondary | ICD-10-CM

## 2021-09-30 NOTE — Progress Notes (Signed)
Chief Complaint  Patient presents with   Routine Post Op    DOS 07/02/21  --- GASTROC RECESSION, ACHILLES TENDON REPAIR, HEEL SPUR RESECTION AND CAST APPLICATION   "I'm off the scooter and walking. Its real sore and swollen in my legs and around the back of the heel"     HPI: Patient is 60 y.o. female who presents today for post op follow up-  date of surgery was 07/02/21.  She states she is participating in physical therapy and she feels like she is improving.  She still has some pain at the back of the heel and anterior leg.  Her physical therapist has recommending continuing treatment to include some aquatic therapy.     Allergies  Allergen Reactions   Rocephin [Ceftriaxone Sodium In Dextrose] Hives    Review of systems is negative except as noted in the HPI.  Denies nausea/ vomiting/ fevers/ chills or night sweats.   Denies difficulty breathing, denies calf pain or tenderness  Physical Exam  Patient is awake, alert, and oriented x 3.  In no acute distress.    Vascular status is intact with palpable pedal pulses DP and PT bilateral and capillary refill time less than 3 seconds bilateral.  No edema or erythema noted.   Neurological exam reveals epicritic and protective sensation grossly intact bilateral.   Dermatological exam reveals skin is supple and dry to bilateral feet.  No open lesions present.    Musculoskeletal exam: dorsiflexion, plantarflexio, inversion and eversion muscle strength appears good.  She does still have some pain on the posterior aspect of the calcaneus at the most distal portion of the achilles tendon insertion.  Calf is supple and appears to have some post op atrophy.  Some swelling above where she wears the compression anklet also noted.    Assessment: 1. Achilles tendinitis of right lower extremity   2. Status post foot surgery      Plan: Discussed her progress and encouraged her that she is doing well at this point post operatively.  Her physical  therapist would like to extend her therapy and I agreed that this would be beneficial especially adding the aquatic therapy would also be helpful.  I recommended she continue to wear the boot as needed.  I also wrote for a 20-50mmhg compression stocking to be worn on the right side to help with some of the swelling she is experiencing.  PT will be extended and she will return in a month for likely a final follow up from surgery. If any concerns arise prior to this visit she will call

## 2021-10-01 ENCOUNTER — Encounter: Payer: Self-pay | Admitting: Physical Therapy

## 2021-10-01 ENCOUNTER — Ambulatory Visit (INDEPENDENT_AMBULATORY_CARE_PROVIDER_SITE_OTHER): Payer: 59 | Admitting: Physical Therapy

## 2021-10-01 DIAGNOSIS — M25571 Pain in right ankle and joints of right foot: Secondary | ICD-10-CM

## 2021-10-01 DIAGNOSIS — M6281 Muscle weakness (generalized): Secondary | ICD-10-CM

## 2021-10-01 DIAGNOSIS — R2689 Other abnormalities of gait and mobility: Secondary | ICD-10-CM | POA: Diagnosis not present

## 2021-10-01 DIAGNOSIS — R262 Difficulty in walking, not elsewhere classified: Secondary | ICD-10-CM

## 2021-10-01 NOTE — Therapy (Signed)
[facility] [place] [address] [place] [address] [place], [place], [address] Phone: [phone]   Fax:  [phone]  Physical Therapy Treatment  Patient Details  Name: Laura Fields MRN: [id number] Date of Birth: October 17, 1961 Referring Provider (PT): Hyatt,Max   Encounter Date: 10/01/2021   PT End of Session - 10/01/21 1103     Visit Number 6    Number of Visits 12    Date for PT Re-Evaluation 10/20/21    PT Start Time 1100    PT Stop Time 1133    PT Time Calculation (min) 33 min    Activity Tolerance Patient tolerated treatment well    Behavior During Therapy [facility] for tasks assessed/performed             Past Medical History:  Diagnosis Date   Asthma    Intrinsic atopic dermatitis 12/01/2020    Past Surgical History:  Procedure Laterality Date   ABDOMINAL HYSTERECTOMY     AUGMENTATION MAMMAPLASTY Bilateral 1999   Saline/ under muscle   back nerve     block   COLONOSCOPY  09/23/2013   [facility]   GASTRIC BYPASS     KNEE ARTHROSCOPY     mass removable     on thumb   STOMACH SURGERY      There were no vitals filed for this visit.   Subjective Assessment - 10/01/21 1104     Subjective Pt reports that she had follow up with surgeon's assistant.  She was given a longer compression sleeve due to swelling in ankle/calf.    Currently in Pain? Yes    Pain Score 7     Pain Location Ankle    Pain Orientation Right;Posterior    Pain Descriptors / Indicators Sore    Pain Radiating Towards into Rt calf    Aggravating Factors  pressure on heel    Pain Relieving Factors ice                [facility] PT Assessment - 10/01/21 0001       Assessment   Medical Diagnosis Achilles repair    Referring Provider (PT) Hyatt,Max    Onset Date/Surgical Date 07/02/21    Next MD Visit 09/30/21    Prior Therapy none      Restrictions   RLE Weight Bearing Weight bearing as tolerated              [facility] Adult PT Treatment/Exercise -  10/01/21 0001       Knee/Hip Exercises: Standing   Forward Step Up Right;1 set;10 reps;Hand Hold: 2;Step Height: 6"   with boot on.   SLS RLE x 15 sec x 2 with intermittent UE to steady (wearing boot)      Knee/Hip Exercises: Seated   Sit to Sand 1 set;10 reps;without UE support   on NuStep seat (elevated)- eccentric lowering. cues for even weight between ft.     Knee/Hip Exercises: Sidelying   Hip ABduction Strengthening;Right;1 set;10 reps;5 reps   increased low back pain   Clams Rt x 10      Ankle Exercises: Seated   Ankle Circles/Pumps Right;AROM;5 reps    BAPS Sitting;Level 4;15 reps   CW/CCW, PF/DF   Other Seated Ankle Exercises Rt ankle resisted DF, inversion, eversion (challenging) with red band x 5, PF x 10 red      Ankle Exercises: Stretches   Gastroc Stretch 4 reps;20 seconds   seated with strap     Ankle Exercises: Aerobic  Nustep L4 x 5 min for warm up with boot off; range to tolerance      Ankle Exercises: Supine   Other Supine Ankle Exercises Bridge x 10                          PT Long Term Goals - 09/28/21 1240       PT LONG TERM GOAL #1   Title Pt will be independent with HEP    Time 6    Period Weeks    Status On-going    Target Date 10/20/21      PT LONG TERM GOAL #2   Title Pt will improve FOTO to >=52 to demo improved functional mobility    Time 6    Period Weeks    Status On-going    Target Date 10/20/21      PT LONG TERM GOAL #3   Title Pt will tolerate gait x 50' without AD with pain <= 3/10 to improve household mobility    Time 6    Period Weeks    Status On-going    Target Date 10/20/21      PT LONG TERM GOAL #4   Title Pt will improve Rt ankle strength to 4/5 to tolerate gait and mobility    Time 6    Period Weeks    Status On-going    Target Date 10/20/21                   Plan - 10/01/21 1133     Clinical Impression Statement Pt walked in today with boot on; no scooter.  She brought a shoe but was  unable to don it (too tight).  Trialed red band with seated ankle exercises - too challenging today; recommended she continue to use yellow band at home.  Exercise tolerance continues to improve each visit.  Pt remains motivated to progress towards LTGs.    Rehab Potential Good    PT Frequency 2x / week    PT Duration 6 weeks    PT Treatment/Interventions Aquatic Therapy;Cryotherapy;Moist Heat;Iontophoresis 4mg /ml Dexamethasone;Electrical Stimulation;Gait training;Stair training;Functional mobility training;Neuromuscular re-education;Balance training;Therapeutic exercise;Therapeutic activities;Patient/family education;Manual techniques;Dry needling;Taping;Vasopneumatic Device    PT Next Visit Plan progress ankle strength and ROM as tolerated, wt bearing as tolerated.  Gait.    PT Home Exercise Plan [id number]    Consulted and Agree with Plan of Care Patient             Patient will benefit from skilled therapeutic intervention in order to improve the following deficits and impairments:  Pain, Decreased strength, Decreased activity tolerance, Decreased range of motion, Increased edema, Impaired sensation, Difficulty walking, Abnormal gait, Decreased balance, Decreased mobility  Visit Diagnosis: Pain in right ankle and joints of right foot  Other abnormalities of gait and mobility  Difficulty in walking, not elsewhere classified  Muscle weakness (generalized)     Problem List Patient Active Problem List   Diagnosis Date Noted   Numbness and tingling in left hand 09/02/2021   Gastroesophageal reflux disease 07/28/2021   Intrinsic atopic dermatitis 12/01/2020   Dysfunction of both eustachian tubes 12/01/2020   Pruritic rash 02/12/2020   Allergic conjunctivitis of both eyes 02/12/2020   Seasonal allergic rhinitis with a nonallergic component 12/25/2019   Allergic conjunctivitis 12/25/2019   Moderate persistent asthma without complication 12/25/2019   02/24/2020,  PTA 10/01/21 11:55 AM  [facility] [address] [place] [address]  [place], [place], [address] Phone: [phone]   Fax:  [phone]  Name: Laura Fields MRN: [id number] Date of Birth: Dec 01, 1960

## 2021-10-05 ENCOUNTER — Encounter: Payer: 59 | Admitting: Physical Therapy

## 2021-10-07 ENCOUNTER — Ambulatory Visit
Admission: RE | Admit: 2021-10-07 | Discharge: 2021-10-07 | Disposition: A | Discharge status: Home / Self Care | Payer: 59 | Source: Ambulatory Visit | Attending: Orthopedic Surgery | Admitting: Orthopedic Surgery

## 2021-10-07 DIAGNOSIS — R2231 Localized swelling, mass and lump, right upper limb: Secondary | ICD-10-CM

## 2021-10-07 IMAGING — US US EXTREM UP *R* LTD
1 series · 14 of 16 positions shown · non-contrast
Comparison: Plain films right hand [DATE].

CLINICAL DATA: History of excision of a mass from the right thumb
proximally 2 years ago. Question mass.

EXAM:
ULTRASOUND RIGHT UPPER EXTREMITY LIMITED
TECHNIQUE: Ultrasound examination of the upper extremity soft tissues was
performed in the area of clinical concern.

[Series 1: us extrem up *right* ltd · 0.04mm/px · 16 acquisitions, 14 frames shown]
[im 1/16]
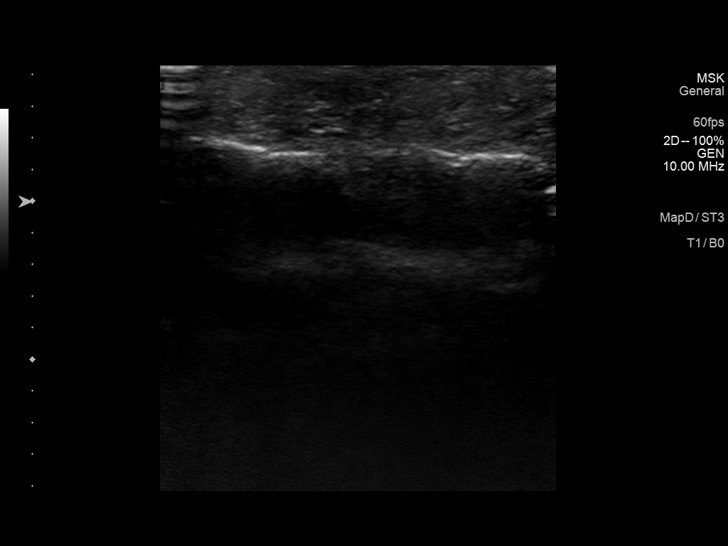
[im 2/16]
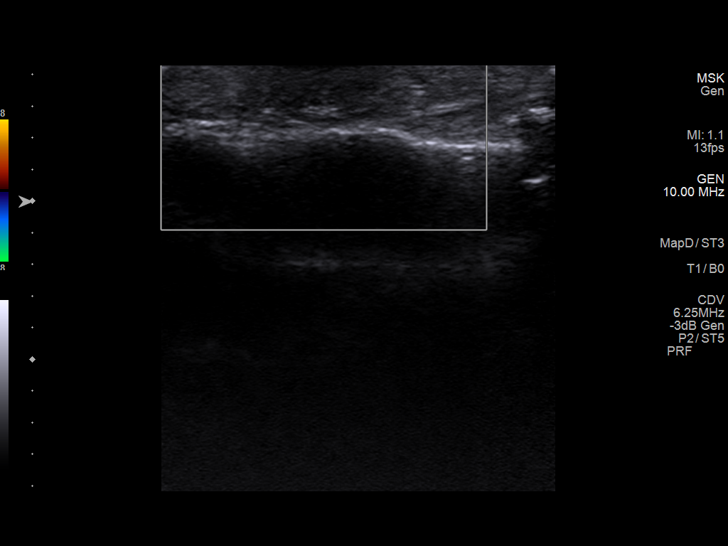
[im 3/16]
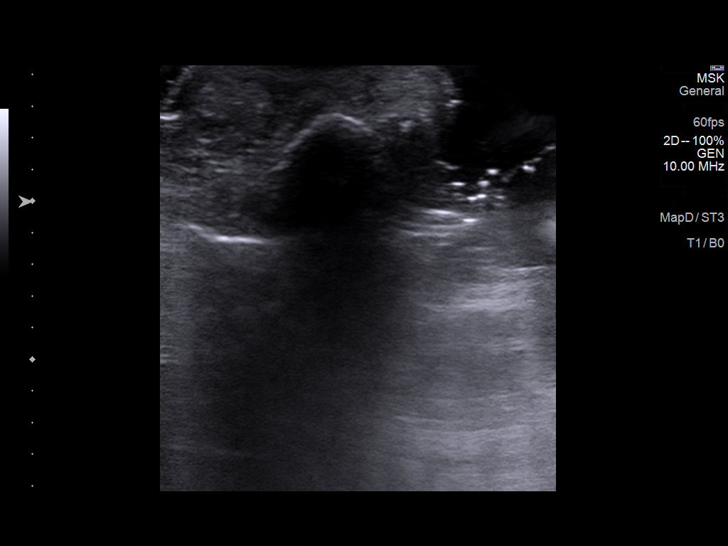
[im 5/16]
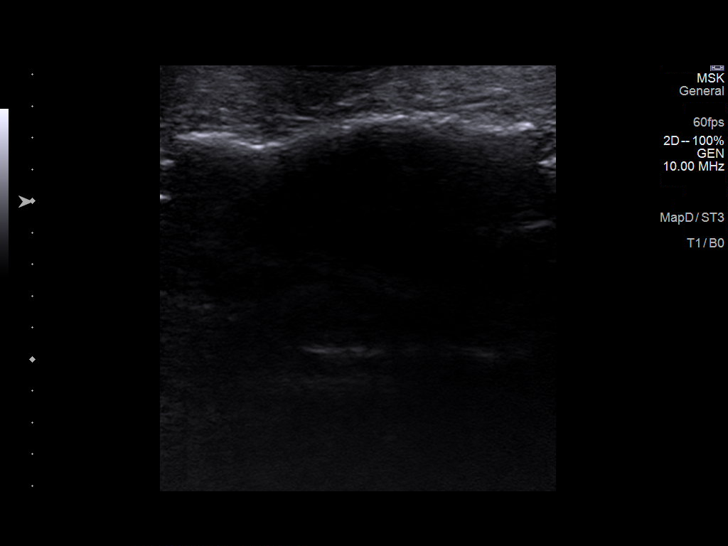
[im 6/16]
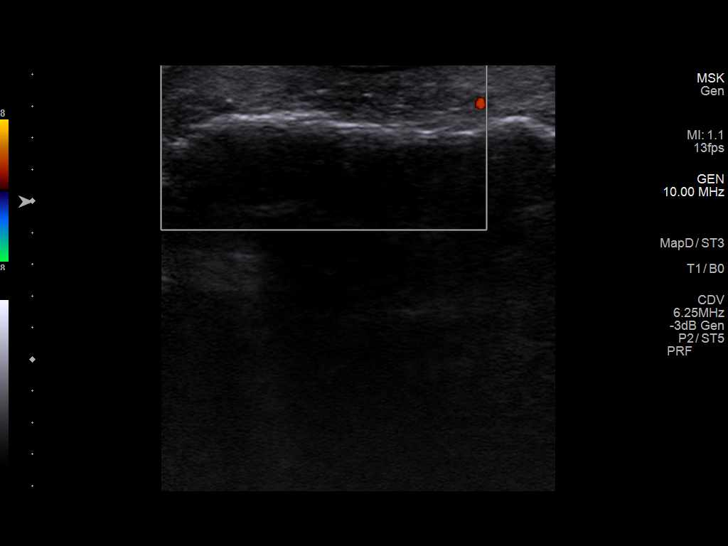
[im 7/16]
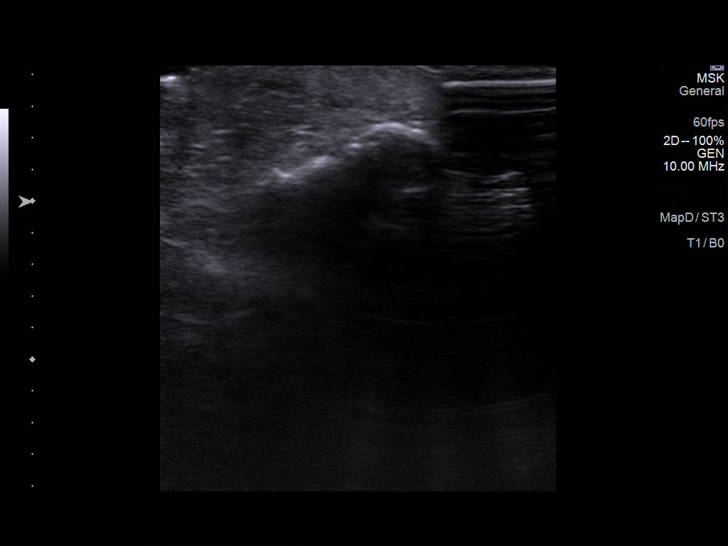
[im 8/16]
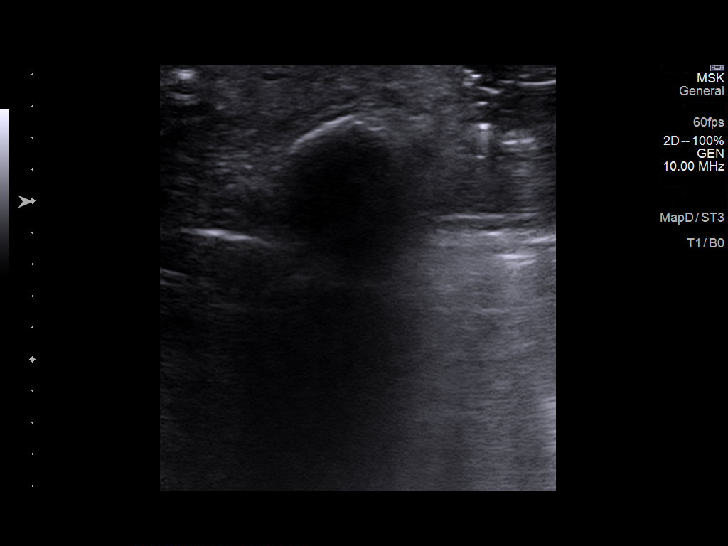
[im 9/16]
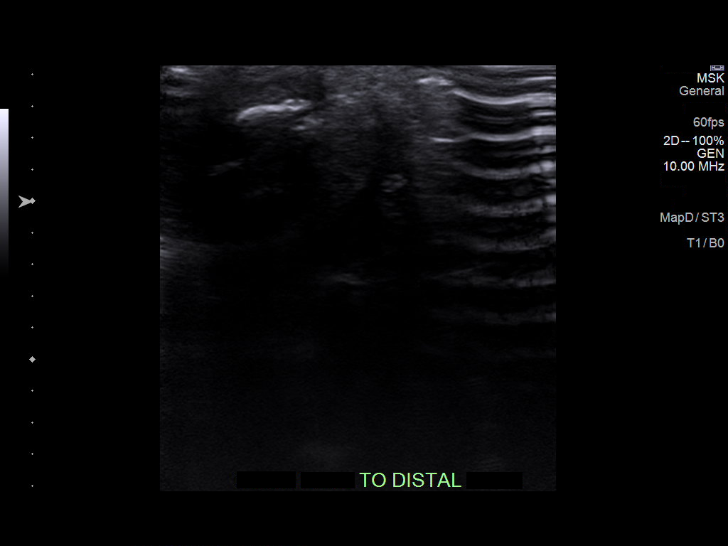
[im 10/16]
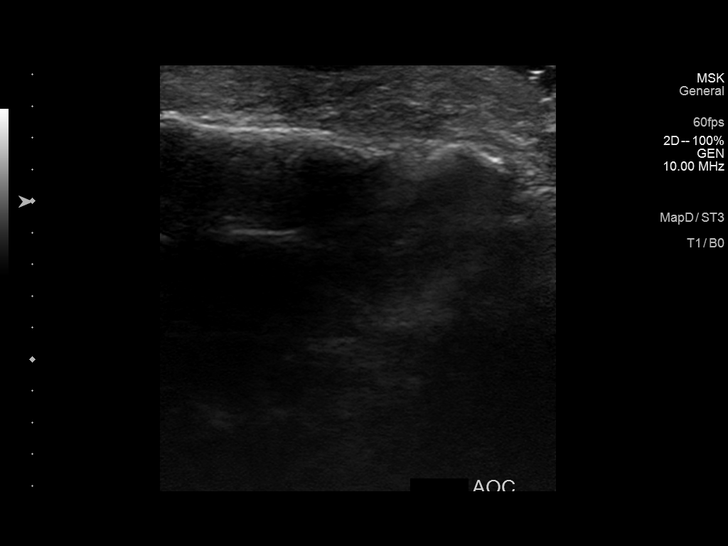
[im 11/16]
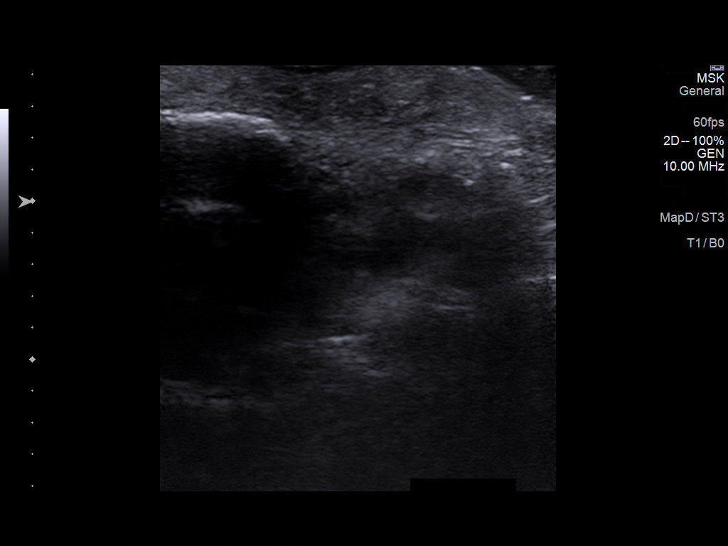
[im 13/16]
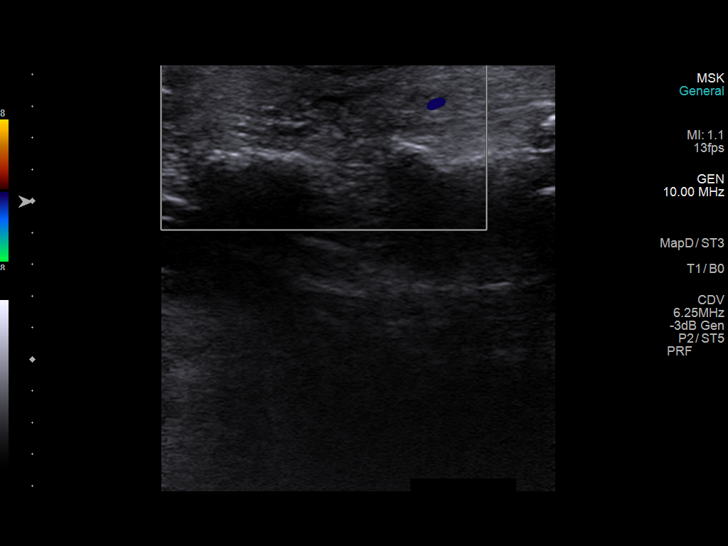
[im 14/16]
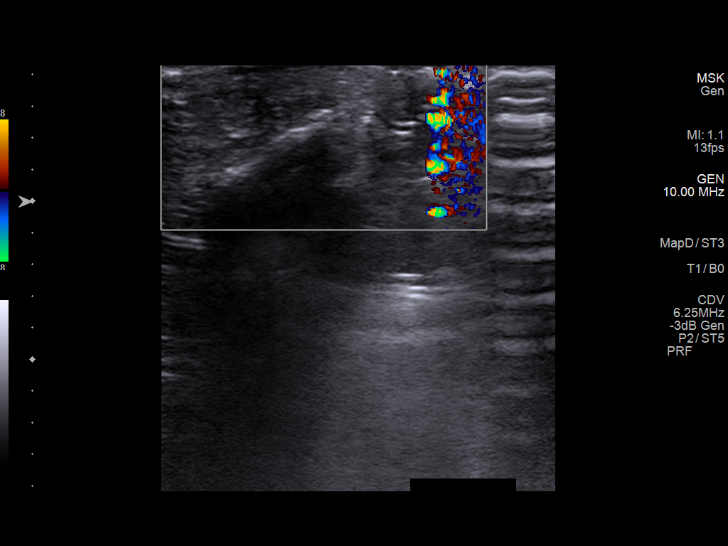
[im 15/16]
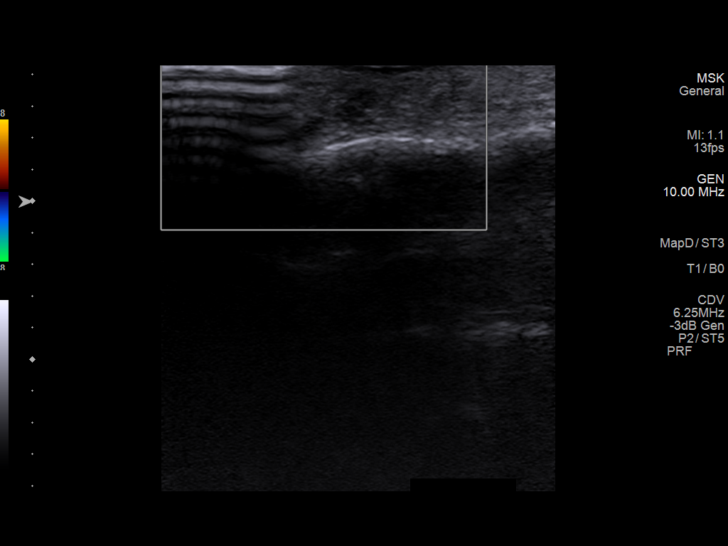
[im 16/16]
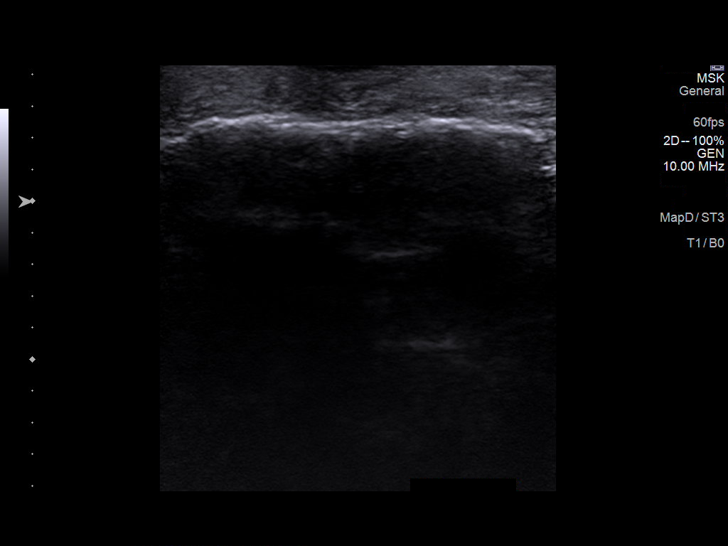

[14 of 16 positions shown; findings below may reference images not displayed]

FINDINGS: Scanning was directed toward the region of concern. No fluid
collection, mass or other abnormality is identified.
IMPRESSION: Negative exam.

## 2021-10-08 ENCOUNTER — Ambulatory Visit: Payer: 59 | Admitting: Physical Therapy

## 2021-10-08 ENCOUNTER — Encounter: Payer: 59 | Admitting: Physical Therapy

## 2021-10-08 ENCOUNTER — Other Ambulatory Visit: Payer: Self-pay

## 2021-10-08 ENCOUNTER — Ambulatory Visit (INDEPENDENT_AMBULATORY_CARE_PROVIDER_SITE_OTHER): Payer: 59 | Admitting: Physical Therapy

## 2021-10-08 DIAGNOSIS — M25571 Pain in right ankle and joints of right foot: Secondary | ICD-10-CM

## 2021-10-08 DIAGNOSIS — R2689 Other abnormalities of gait and mobility: Secondary | ICD-10-CM | POA: Diagnosis not present

## 2021-10-08 DIAGNOSIS — M6281 Muscle weakness (generalized): Secondary | ICD-10-CM

## 2021-10-08 DIAGNOSIS — R262 Difficulty in walking, not elsewhere classified: Secondary | ICD-10-CM | POA: Diagnosis not present

## 2021-10-08 NOTE — Therapy (Signed)
[facility] [place] [address] [place], [place], [address] Phone: [phone]   Fax:  [phone]  Physical Therapy Treatment  Patient Details  Name: Laura Fields MRN: [id number] Date of Birth: 06-May-1961 Referring Provider (PT): Hyatt,Max   Encounter Date: 10/08/2021   PT End of Session - 10/08/21 1357     Visit Number 7    Number of Visits 12    Date for PT Re-Evaluation 10/20/21    PT Start Time 1315    PT Stop Time 1400    PT Time Calculation (min) 45 min    Activity Tolerance Patient tolerated treatment well    Behavior During Therapy [facility] for tasks assessed/performed             Past Medical History:  Diagnosis Date   Asthma    Intrinsic atopic dermatitis 12/01/2020    Past Surgical History:  Procedure Laterality Date   ABDOMINAL HYSTERECTOMY     AUGMENTATION MAMMAPLASTY Bilateral 1999   Saline/ under muscle   back nerve     block   COLONOSCOPY  09/23/2013   [facility]   GASTRIC BYPASS     KNEE ARTHROSCOPY     mass removable     on thumb   STOMACH SURGERY      There were no vitals filed for this visit.   Subjective Assessment - 10/08/21 1317     Subjective Pt states she has been more swollen lately, still having a lot of pain    Patient Stated Goals be able to walk and wt bear Rt foot/ankle    Currently in Pain? Yes    Pain Score 9     Pain Location Ankle    Pain Orientation Right;Posterior    Pain Descriptors / Indicators Sore                               [facility] Adult PT Treatment/Exercise - 10/08/21 0001       Knee/Hip Exercises: Standing   Hip Extension Right;15 reps    Lateral Step Up Right;Hand Hold: 2;10 reps;Step Height: 6"    Forward Step Up Right;1 set;10 reps;Hand Hold: 2;Step Height: 6"   with boot on.   Other Standing Knee Exercises tandem stance 2 x 30 sec intermittent UE support      Knee/Hip Exercises: Seated   Sit to Sand 1 set;10 reps;without UE  support   on NuStep seat (elevated)- eccentric lowering. cues for even weight between ft.     Knee/Hip Exercises: Supine   Straight Leg Raises Strengthening;Right;10 reps      Knee/Hip Exercises: Sidelying   Hip ABduction Strengthening;Right;10 reps    Clams Rt x 10      Ankle Exercises: Aerobic   Nustep L4 x 5 min for warm up with boot off; range to tolerance      Ankle Exercises: Seated   BAPS Sitting;Level 4;15 reps   CW/CCW, PF/DF   Other Seated Ankle Exercises ankle PF and DF red TB x 12, yellow TB x 12 inversion/eversion    Other Seated Ankle Exercises rockerboard for PF/DF x 30      Ankle Exercises: Supine   Other Supine Ankle Exercises Bridge x 10      Ankle Exercises: Stretches   Gastroc Stretch 4 reps;20 seconds   seated with strap  PT Long Term Goals - 09/28/21 1240       PT LONG TERM GOAL #1   Title Pt will be independent with HEP    Time 6    Period Weeks    Status On-going    Target Date 10/20/21      PT LONG TERM GOAL #2   Title Pt will improve FOTO to >=52 to demo improved functional mobility    Time 6    Period Weeks    Status On-going    Target Date 10/20/21      PT LONG TERM GOAL #3   Title Pt will tolerate gait x 50' without AD with pain <= 3/10 to improve household mobility    Time 6    Period Weeks    Status On-going    Target Date 10/20/21      PT LONG TERM GOAL #4   Title Pt will improve Rt ankle strength to 4/5 to tolerate gait and mobility    Time 6    Period Weeks    Status On-going    Target Date 10/20/21                   Plan - 10/08/21 1358     Clinical Impression Statement Pt continues to improve gait with boot on. Pt with increased swelling today and unable to don shoe. Pt with good tolerance to standing exercises, still increased pain with ankle theraband    PT Next Visit Plan wt bearing and gait, ankle ROM    PT Home Exercise Plan [id number]    Consulted and Agree with Plan  of Care Patient             Patient will benefit from skilled therapeutic intervention in order to improve the following deficits and impairments:     Visit Diagnosis: Pain in right ankle and joints of right foot  Other abnormalities of gait and mobility  Difficulty in walking, not elsewhere classified  Muscle weakness (generalized)     Problem List Patient Active Problem List   Diagnosis Date Noted   Numbness and tingling in left hand 09/02/2021   Gastroesophageal reflux disease 07/28/2021   Intrinsic atopic dermatitis 12/01/2020   Dysfunction of both eustachian tubes 12/01/2020   Pruritic rash 02/12/2020   Allergic conjunctivitis of both eyes 02/12/2020   Seasonal allergic rhinitis with a nonallergic component 12/25/2019   Allergic conjunctivitis 12/25/2019   Moderate persistent asthma without complication 12/25/2019    Brennyn Haisley, PT 10/08/2021, 2:02 PM  [facility] [address] [place] [address] [place], [place], [address] Phone: [phone]   Fax:  [phone]  Name: Laura Fields MRN: [id number] Date of Birth: 1960/11/07

## 2021-10-11 ENCOUNTER — Other Ambulatory Visit: Payer: Self-pay

## 2021-10-11 ENCOUNTER — Ambulatory Visit (INDEPENDENT_AMBULATORY_CARE_PROVIDER_SITE_OTHER): Payer: 59 | Admitting: Orthopedic Surgery

## 2021-10-11 ENCOUNTER — Encounter: Payer: Self-pay | Admitting: Orthopedic Surgery

## 2021-10-11 DIAGNOSIS — R29898 Other symptoms and signs involving the musculoskeletal system: Secondary | ICD-10-CM | POA: Insufficient documentation

## 2021-10-11 NOTE — Progress Notes (Signed)
Office Visit Note   Patient: Laura Fields           Date of Birth: 02/27/1961           MRN: [id number] Visit Date: 10/11/2021              Requested by: Olive Bass, FNP [address] [place],  [place] [address] PCP: Olive Bass, FNP   Assessment & Plan: Visit Diagnoses:  1. Left hand weakness     Plan: We reviewed the recent ultrasound of the right thumb which was negative for any mass recurrence.  She is still concerned about the area and will continue to monitor for any new masses or swelling.  Regarding her left hand weakness and spasms, we again discussed the role of hand therapy in overall hand strengthening.  She will work with therapy once she can get an appointment and will see me back in a few months.   Follow-Up Instructions: No follow-ups on file.   Orders:  No orders of the defined types were placed in this encounter.  No orders of the defined types were placed in this encounter.     Procedures: No procedures performed   Clinical Data: No additional findings.   Subjective: Chief Complaint  Patient presents with   Right Thumb - Follow-up    This is a 60 year old right-hand-dominant female who presents for follow-up of left hand weakness and concern for a right thumb mass.  An ultrasound was recently obtained to look for the right thumb mass which was negative.  She still describes left hand weakness with occasional spasms.  She previously underwent EMG/NCS for hand numbness which was unremarkable.  She is still interested in working with hand therapy and agrees that it will be beneficial.     Review of Systems   Objective: Vital Signs: There were no vitals taken for this visit.  Physical Exam Constitutional:      Appearance: Normal appearance.  Cardiovascular:     Rate and Rhythm: Normal rate.     Pulses: Normal pulses.  Pulmonary:     Effort: Pulmonary effort is normal.  Skin:    General: Skin is warm  and dry.     Capillary Refill: Capillary refill takes less than 2 seconds.  Neurological:     Mental Status: She is alert.    Right Hand Exam   Comments:  Callus at pad of thumb in area of previous mass excision.    Left Hand Exam   Tenderness  The patient is experiencing no tenderness.   Range of Motion  The patient has normal left wrist ROM.  Other  Erythema: absent Sensation: normal Pulse: present     Specialty Comments:  No specialty comments available.  Imaging: No results found.   PMFS History: Patient Active Problem List   Diagnosis Date Noted   Left hand weakness 10/11/2021   Numbness and tingling in left hand 09/02/2021   Gastroesophageal reflux disease 07/28/2021   Intrinsic atopic dermatitis 12/01/2020   Dysfunction of both eustachian tubes 12/01/2020   Pruritic rash 02/12/2020   Allergic conjunctivitis of both eyes 02/12/2020   Seasonal allergic rhinitis with a nonallergic component 12/25/2019   Allergic conjunctivitis 12/25/2019   Moderate persistent asthma without complication 12/25/2019   Past Medical History:  Diagnosis Date   Asthma    Intrinsic atopic dermatitis 12/01/2020    Family History  Problem Relation Age of Onset   Hypertension Mother  Allergic rhinitis Neg Hx    Angioedema Neg Hx    Asthma Neg Hx    Eczema Neg Hx    Immunodeficiency Neg Hx    Urticaria Neg Hx     Past Surgical History:  Procedure Laterality Date   ABDOMINAL HYSTERECTOMY     AUGMENTATION MAMMAPLASTY Bilateral 1999   Saline/ under muscle   back nerve     block   COLONOSCOPY  09/23/2013   [facility]   GASTRIC BYPASS     KNEE ARTHROSCOPY     mass removable     on thumb   STOMACH SURGERY     Social History   Occupational History   Not on file  Tobacco Use   Smoking status: Never    Passive exposure: Yes   Smokeless tobacco: Never  Vaping Use   Vaping Use: Never used  Substance and Sexual Activity   Alcohol use: Yes   Drug use: Never    Sexual activity: Not on file

## 2021-10-12 ENCOUNTER — Ambulatory Visit (INDEPENDENT_AMBULATORY_CARE_PROVIDER_SITE_OTHER): Payer: 59 | Admitting: Physical Therapy

## 2021-10-12 DIAGNOSIS — R262 Difficulty in walking, not elsewhere classified: Secondary | ICD-10-CM

## 2021-10-12 DIAGNOSIS — R2689 Other abnormalities of gait and mobility: Secondary | ICD-10-CM | POA: Diagnosis not present

## 2021-10-12 DIAGNOSIS — M6281 Muscle weakness (generalized): Secondary | ICD-10-CM | POA: Diagnosis not present

## 2021-10-12 DIAGNOSIS — M25571 Pain in right ankle and joints of right foot: Secondary | ICD-10-CM

## 2021-10-12 NOTE — Therapy (Signed)
[facility] [address] [place], [place], [address] Phone: [phone]   Fax:  [phone]  Physical Therapy Treatment  Patient Details  Name: Laura Fields MRN: [id number] Date of Birth: [birth year] Referring Provider (PT): Hyatt,Max   Encounter Date: 10/12/2021   PT End of Session - 10/12/21 1149     Visit Number 8    Number of Visits 12    Date for PT Re-Evaluation 10/20/21    PT Start Time 1100    PT Stop Time 1145    PT Time Calculation (min) 45 min    Activity Tolerance Patient tolerated treatment well    Behavior During Therapy [facility] for tasks assessed/performed             Past Medical History:  Diagnosis Date   Asthma    Intrinsic atopic dermatitis 12/01/2020    Past Surgical History:  Procedure Laterality Date   ABDOMINAL HYSTERECTOMY     AUGMENTATION MAMMAPLASTY Bilateral 1999   Saline/ under muscle   back nerve     block   COLONOSCOPY  09/23/2013   [facility]   GASTRIC BYPASS     KNEE ARTHROSCOPY     mass removable     on thumb   STOMACH SURGERY      There were no vitals filed for this visit.   Subjective Assessment - 10/12/21 1101     Subjective Pt states she fell on Saturday. She felt like her anke "just gave out". She still feels a lot of pain and swelling    Patient Stated Goals be able to walk and wt bear Rt foot/ankle    Currently in Pain? Yes    Pain Score 9     Pain Location Ankle    Pain Orientation Right                [facility] PT Assessment - 10/12/21 0001       Assessment   Medical Diagnosis Achilles repair    Referring Provider (PT) Hyatt,Max    Onset Date/Surgical Date 07/02/21      Restrictions   RLE Weight Bearing Weight bearing as tolerated      AROM   Right Ankle Dorsiflexion 3                           [facility] Adult PT Treatment/Exercise - 10/12/21 0001       Ambulation/Gait   Gait Comments forward gait with focus on step  through pattern. decreased cadence and decreased step length bilat. sideways and backward walking with CGA,decreased step length and cadence      Knee/Hip Exercises: Standing   Lateral Step Up Right;Step Height: 4";10 reps    Lateral Step Up Limitations shoe on    Forward Step Up Right;Step Height: 4";Hand Hold: 1;10 reps    Forward Step Up Limitations with shoe    Other Standing Knee Exercises tandem stance 2 x 30 sec intermittent UE support    Other Standing Knee Exercises standing on foam with head turns x 10 - minA      Knee/Hip Exercises: Supine   Straight Leg Raises Strengthening;Right;15 reps      Knee/Hip Exercises: Sidelying   Hip ABduction Strengthening;Right;15 reps    Clams Rt x 15      Kinesiotix   Create Space I strip of reg Rock tape applied to Rt distal calf to calcaneus.  Perpendicular strips placed at calcaneus and over  incision in midcalf (50% stretch) to decompress tissue and increase proprioception.      Ankle Exercises: Seated   Other Seated Ankle Exercises ankle PF and DF red TB x 12, yellow TB x 12 inversion/eversion    Other Seated Ankle Exercises rockerboard for PF/DF x 30      Ankle Exercises: Standing   Other Standing Ankle Exercises wt shifts lateral and A/P x 60 seconds each      Ankle Exercises: Supine   Other Supine Ankle Exercises bridge x 15      Ankle Exercises: Aerobic   Nustep L5 x 5 min for warm up      Ankle Exercises: Stretches   Gastroc Stretch 4 reps;20 seconds   seated with strap                         PT Long Term Goals - 09/28/21 1240       PT LONG TERM GOAL #1   Title Pt will be independent with HEP    Time 6    Period Weeks    Status On-going    Target Date 10/20/21      PT LONG TERM GOAL #2   Title Pt will improve FOTO to >=52 to demo improved functional mobility    Time 6    Period Weeks    Status On-going    Target Date 10/20/21      PT LONG TERM GOAL #3   Title Pt will tolerate gait x 50'  without AD with pain <= 3/10 to improve household mobility    Time 6    Period Weeks    Status On-going    Target Date 10/20/21      PT LONG TERM GOAL #4   Title Pt will improve Rt ankle strength to 4/5 to tolerate gait and mobility    Time 6    Period Weeks    Status On-going    Target Date 10/20/21                   Plan - 10/12/21 1151     Clinical Impression Statement Pt with much improved tolerance to standing and gait in shoe this visit. She has increased pain with backward gait but improved tolerance to step ups and balance training today    PT Next Visit Plan gait backwards, laterally  and balance in shoe, RECERT    PT Home Exercise Plan [id number]    Consulted and Agree with Plan of Care Patient             Patient will benefit from skilled therapeutic intervention in order to improve the following deficits and impairments:     Visit Diagnosis: Pain in right ankle and joints of right foot  Other abnormalities of gait and mobility  Difficulty in walking, not elsewhere classified  Muscle weakness (generalized)     Problem List Patient Active Problem List   Diagnosis Date Noted   Left hand weakness 10/11/2021   Numbness and tingling in left hand 09/02/2021   Gastroesophageal reflux disease 07/28/2021   Intrinsic atopic dermatitis 12/01/2020   Dysfunction of both eustachian tubes 12/01/2020   Pruritic rash 02/12/2020   Allergic conjunctivitis of both eyes 02/12/2020   Seasonal allergic rhinitis with a nonallergic component 12/25/2019   Allergic conjunctivitis 12/25/2019   Moderate persistent asthma without complication 12/25/2019    Laura Fields, PT 10/12/2021, 11:55 AM  [facility] [address]  [address] [place], [place], [address] Phone: [phone]   Fax:  [phone]  Name: Laura Fields MRN: [id number] Date of Birth: 1960/11/25

## 2021-10-15 ENCOUNTER — Encounter: Payer: 59 | Admitting: Physical Therapy

## 2021-10-15 ENCOUNTER — Ambulatory Visit: Payer: 59 | Admitting: Physical Therapy

## 2021-10-16 ENCOUNTER — Other Ambulatory Visit: Payer: Self-pay | Admitting: Family Medicine

## 2021-10-20 ENCOUNTER — Encounter: Payer: 59 | Admitting: Physical Therapy

## 2021-10-22 ENCOUNTER — Encounter: Payer: Self-pay | Admitting: Physical Therapy

## 2021-10-22 ENCOUNTER — Other Ambulatory Visit: Payer: Self-pay

## 2021-10-22 ENCOUNTER — Ambulatory Visit (INDEPENDENT_AMBULATORY_CARE_PROVIDER_SITE_OTHER): Payer: 59 | Admitting: Physical Therapy

## 2021-10-22 DIAGNOSIS — M6281 Muscle weakness (generalized): Secondary | ICD-10-CM

## 2021-10-22 DIAGNOSIS — R262 Difficulty in walking, not elsewhere classified: Secondary | ICD-10-CM

## 2021-10-22 DIAGNOSIS — M25571 Pain in right ankle and joints of right foot: Secondary | ICD-10-CM | POA: Diagnosis not present

## 2021-10-22 DIAGNOSIS — R2689 Other abnormalities of gait and mobility: Secondary | ICD-10-CM | POA: Diagnosis not present

## 2021-10-22 NOTE — Therapy (Addendum)
[place], [place], [address] Phone: [phone]   Fax:  [phone]  Physical Therapy Treatment and Recertification  Patient Details  Name: Laura Fields MRN: [id number] Date of Birth: [birth year] Referring Provider (PT): Hyatt,Max   Encounter Date: 10/22/2021   PT End of Session - 10/22/21 1430     Visit Number 9    Number of Visits 12    PT Start Time 1430    PT Stop Time 1520    PT Time Calculation (min) 50 min    Activity Tolerance Patient tolerated treatment well;No increased pain    Behavior During Therapy [facility] for tasks assessed/performed             Past Medical History:  Diagnosis Date   Asthma    Intrinsic atopic dermatitis 12/01/2020    Past Surgical History:  Procedure Laterality Date   ABDOMINAL HYSTERECTOMY     AUGMENTATION MAMMAPLASTY Bilateral 1999   Saline/ under muscle   back nerve     block   COLONOSCOPY  09/23/2013   [facility]   GASTRIC BYPASS     KNEE ARTHROSCOPY     mass removable     on thumb   STOMACH SURGERY      There were no vitals filed for this visit.   Subjective Assessment - 10/22/21 1431     Subjective Pt reports she fell again this week on Monday (caught herself). She reports her ankle feels so weak. She has been wearing regular shoe for 10 min a day, then the boot the rest of awake hours.    Patient Stated Goals be able to walk and wt bear Rt foot/ankle    Currently in Pain? Yes    Pain Score 7    elevates with standing   Pain Location Ankle    Pain Orientation Right;Posterior    Pain Descriptors / Indicators Aching;Tightness;Sore    Pain Radiating Towards into Rt calf    Aggravating Factors  pressure on heel    Pain Relieving Factors ice,  Rock tape.                [facility] PT Assessment - 10/22/21 0001       Assessment   Medical Diagnosis Achilles repair    Referring Provider (PT) Hyatt,Max    Onset Date/Surgical Date  07/02/21    Next MD Visit 11/02/21      Precautions   Precaution Comments Rt boot      Restrictions   RLE Weight Bearing Weight bearing as tolerated      Observation/Other Assessments   Focus on Therapeutic Outcomes (FOTO)  43 functional status; goal of 58.      Strength   Right Ankle Dorsiflexion 4-/5    Right Ankle Plantar Flexion 3+/5    Right Ankle Inversion 4-/5    Right Ankle Eversion 4/5             Pt seen for aquatic therapy today.  Treatment took place in water 3.25-4 ft in depth at the [facility] pool. Temp of water was 94.  Pt entered/exited the pool via stairs with supervision and step to pattern, with bilat rail.  Treatment:   Seated on bench in water:   Ankle circles CW/CCW,  ankle inversion, eversion.  Rt LAQ x 20, with cues to DF foot with knee ext.  Near bench but without support  - side stepping R/L   With UE support on pool edge:  Heel raises x 10.  Forward step ups  (chest deep water) x 10 with RLE ; lateral step ups (chest deep water) x 10.  Weight shifts R/L then SLS with UE support pool x 5 sec each leg, 2 reps. Lt SLS with Rt knee flexion and rolling foot forward to balls of foot to stretch toes into ext. Rt calf stretch x 20 sec x 2, Lx 20sec x 1  With UE support on floatation device:  Squats pushing floatation dumbbell under water x 10 (allowing heels to come up). Holding floating barbell- forward and backward gait, side stepping,  high knee marching forward and backward, semi-tandem gait - all with cues to relax ankle/ toes, increased heel strike, roll through bilat toes. Mini foot lift offs from squat position, trunk submerged under water/head out of water x 10.    Pt requires buoyancy for support and to offload joints with strengthening exercises. Viscosity of the water is needed for resistance of strengthening; water current perturbations provides challenge to standing balance unsupported, requiring increased core activation.     PT Long  Term Goals - 10/22/21 1552       PT LONG TERM GOAL #1   Title Pt will be independent with HEP    Time 6    Period Weeks    Status On-going    Target Date 10/20/21      PT LONG TERM GOAL #2   Title Pt will improve FOTO to >=52 to demo improved functional mobility    Baseline 43: 10/22/21    Time 6    Period Weeks    Status On-going    Target Date 10/20/21      PT LONG TERM GOAL #3   Title Pt will tolerate gait x 50' without AD with pain <= 3/10 to improve household mobility    Baseline can ambulate 50 ft without AD, but pain is 7-9/10    Time 6    Period Weeks    Status Partially Met    Target Date 10/20/21      PT LONG TERM GOAL #4   Title Pt will improve Rt ankle strength to 4/5 to tolerate gait and mobility    Time 6    Period Weeks    Status Partially Met    Target Date 10/20/21                   Plan - 10/22/21 1524     Clinical Impression Statement Pt initially reporting pain of 7/10 in Rt ankle upon entering water; reduced to 3/10 after 30 min of exercise in water. Gait quality improved with pt in chest deep water and cues for increased heel strike and relaxed toes during toe off.  Rt ankle strength improving.  Pain level remains elevated with ambulating (on land).  Pt has partially met her goals and will benefit from additional skilled therapy visits to improved mobility and assist with return to previous level of function.    PT Treatment/Interventions Aquatic Therapy;Cryotherapy;Moist Heat;Iontophoresis 4mg /ml Dexamethasone;Electrical Stimulation;Gait training;Stair training;Functional mobility training;Neuromuscular re-education;Balance training;Therapeutic exercise;Therapeutic activities;Patient/family education;Manual techniques;Dry needling;Taping;Vasopneumatic Device    PT Next Visit Plan Gait and Balance exercises in shoe. Progress ankle strengthening as tolerated.    PT Home Exercise Plan [id number]    Consulted and Agree with Plan of Care Patient              Patient will benefit from skilled therapeutic intervention in order to improve the following deficits and impairments:  Pain, Decreased strength, Decreased activity tolerance, Decreased range of motion, Increased edema, Impaired sensation, Difficulty walking, Abnormal gait, Decreased balance, Decreased mobility  Visit Diagnosis: Pain in right ankle and joints of right foot  Other abnormalities of gait and mobility  Difficulty in walking, not elsewhere classified  Muscle weakness (generalized)     Problem List Patient Active Problem List   Diagnosis Date Noted   Left hand weakness 10/11/2021   Numbness and tingling in left hand 09/02/2021   Gastroesophageal reflux disease 07/28/2021   Intrinsic atopic dermatitis 12/01/2020   Dysfunction of both eustachian tubes 12/01/2020   Pruritic rash 02/12/2020   Allergic conjunctivitis of both eyes 02/12/2020   Seasonal allergic rhinitis with a nonallergic component 12/25/2019   Allergic conjunctivitis 12/25/2019   Moderate persistent asthma without complication 15/02/6978  Isabelle Course, PT,DPT01/12/2310:38 PM  Kerin Perna, PTA 10/22/21 4:08 PM   [place], [place], [address] Phone: [phone]   Fax:  [phone]  Name: Laura Fields MRN: [id number] Date of Birth: [birth year]

## 2021-10-26 ENCOUNTER — Ambulatory Visit: Payer: 59 | Attending: Podiatrist | Admitting: Physical Therapy

## 2021-10-26 ENCOUNTER — Other Ambulatory Visit: Payer: Self-pay

## 2021-10-26 DIAGNOSIS — M25571 Pain in right ankle and joints of right foot: Secondary | ICD-10-CM

## 2021-10-26 DIAGNOSIS — M6281 Muscle weakness (generalized): Secondary | ICD-10-CM

## 2021-10-26 DIAGNOSIS — Z9889 Other specified postprocedural states: Secondary | ICD-10-CM | POA: Insufficient documentation

## 2021-10-26 DIAGNOSIS — R262 Difficulty in walking, not elsewhere classified: Secondary | ICD-10-CM

## 2021-10-26 DIAGNOSIS — M7661 Achilles tendinitis, right leg: Secondary | ICD-10-CM | POA: Insufficient documentation

## 2021-10-26 DIAGNOSIS — R2689 Other abnormalities of gait and mobility: Secondary | ICD-10-CM

## 2021-10-26 NOTE — Therapy (Signed)
[place], [place], [address] Phone: [phone]   Fax:  [phone]  Physical Therapy Treatment  Patient Details  Name: Laura Fields MRN: [id number] Date of Birth: December 31, 1960 Referring Provider (PT): Hyatt,Max   Encounter Date: 10/26/2021   PT End of Session - 10/26/21 1054     Visit Number 10    Number of Visits 22    Date for PT Re-Evaluation 12/02/21    PT Start Time 8466    PT Stop Time 1100    PT Time Calculation (min) 45 min    Activity Tolerance Patient tolerated treatment well    Behavior During Therapy [facility] for tasks assessed/performed             Past Medical History:  Diagnosis Date   Asthma    Intrinsic atopic dermatitis 12/01/2020    Past Surgical History:  Procedure Laterality Date   ABDOMINAL HYSTERECTOMY     AUGMENTATION MAMMAPLASTY Bilateral 1999   Saline/ under muscle   back nerve     block   COLONOSCOPY  09/23/2013   [facility]   GASTRIC BYPASS     KNEE ARTHROSCOPY     mass removable     on thumb   STOMACH SURGERY      There were no vitals filed for this visit.   Subjective Assessment - 10/26/21 1029     Subjective Pt states her asthma is flairing up so she doesn't feel good today. She states she wore her shoe for 10 minutes this morning and her ankle/foot got so swollen it even hurt to put the brace on    Patient Stated Goals be able to walk and wt bear Rt foot/ankle    Currently in Pain? Yes    Pain Score 8     Pain Location Ankle    Pain Orientation Right    Pain Descriptors / Indicators Sharp;Spasm                               [facility] Adult PT Treatment/Exercise - 10/26/21 0001       Knee/Hip Exercises: Supine   Straight Leg Raises Strengthening;Right;15 reps    Straight Leg Raises Limitations to tolerance due to increased pain today      Manual Therapy   Manual Therapy Soft tissue mobilization;Edema management;Joint  mobilization    Edema Management retrograde massage to reduce swelling in Rt ankle with Rt LE elevated    Joint Mobilization grade 1-2  metatarsal mobs to reduce pain and improve mobility    Soft tissue mobilization STM anterior tibilais to reduce pain and spasticity as tolerated      Kinesiotix   Create Space I strip of reg Rock tape applied to Rt distal calf to calcaneus.  Perpendicular strips placed at calcaneus and over incision in midcalf (50% stretch) to decompress tissue and increase proprioception.      Ankle Exercises: Supine   Other Supine Ankle Exercises ankle AROM for PF/DF and Inv/Ev as tolerated      Ankle Exercises: Seated   BAPS Sitting;Level 4;15 reps    Other Seated Ankle Exercises ankle PF and DF red TB x 12, yellow TB x 12 inversion/eversion    Other Seated Ankle Exercises rockerboard for PF/DF x 30                          PT  Long Term Goals - 10/22/21 1552       PT LONG TERM GOAL #1   Title Pt will be independent with HEP    Time 6    Period Weeks    Status On-going    Target Date 10/20/21      PT LONG TERM GOAL #2   Title Pt will improve FOTO to >=52 to demo improved functional mobility    Baseline 43: 10/22/21    Time 6    Period Weeks    Status On-going    Target Date 10/20/21      PT LONG TERM GOAL #3   Title Pt will tolerate gait x 50' without AD with pain <= 3/10 to improve household mobility    Baseline can ambulate 50 ft without AD, but pain is 7-9/10    Time 6    Period Weeks    Status Partially Met    Target Date 10/20/21      PT LONG TERM GOAL #4   Title Pt will improve Rt ankle strength to 4/5 to tolerate gait and mobility    Time 6    Period Weeks    Status Partially Met    Target Date 10/20/21                   Plan - 10/26/21 1055     Clinical Impression Statement Pt with increased pain today, exercises modified to focus on non wt bearing this session. Pt with best response to kinesiotape and gentle  AROM. PT educated pt on continuing to try wt bearing and AROM despite pain to maintain mobility and strength    PT Next Visit Plan Gait and Balance exercises in shoe. Progress ankle strengthening as tolerated.    PT Home Exercise Plan [id number]    Consulted and Agree with Plan of Care Patient             Patient will benefit from skilled therapeutic intervention in order to improve the following deficits and impairments:     Visit Diagnosis: Pain in right ankle and joints of right foot  Other abnormalities of gait and mobility  Difficulty in walking, not elsewhere classified  Muscle weakness (generalized)     Problem List Patient Active Problem List   Diagnosis Date Noted   Left hand weakness 10/11/2021   Numbness and tingling in left hand 09/02/2021   Gastroesophageal reflux disease 07/28/2021   Intrinsic atopic dermatitis 12/01/2020   Dysfunction of both eustachian tubes 12/01/2020   Pruritic rash 02/12/2020   Allergic conjunctivitis of both eyes 02/12/2020   Seasonal allergic rhinitis with a nonallergic component 12/25/2019   Allergic conjunctivitis 12/25/2019   Moderate persistent asthma without complication 00/93/8182    Jaquavious Mercer, PT 10/26/2021, 11:00 AM  [facility] [place], [place], [address] Phone: [phone]   Fax:  [phone]  Name: Laura Fields MRN: [id number] Date of Birth: 15-Oct-1961

## 2021-10-26 NOTE — Addendum Note (Signed)
Addended by: Leone Brand on: 10/26/2021 12:39 PM   Modules accepted: Orders

## 2021-10-28 ENCOUNTER — Ambulatory Visit: Payer: 59 | Admitting: Physical Therapy

## 2021-11-02 ENCOUNTER — Encounter: Payer: Self-pay | Admitting: Podiatry

## 2021-11-02 ENCOUNTER — Other Ambulatory Visit: Payer: Self-pay

## 2021-11-02 ENCOUNTER — Ambulatory Visit (INDEPENDENT_AMBULATORY_CARE_PROVIDER_SITE_OTHER): Payer: 59 | Admitting: Podiatry

## 2021-11-02 DIAGNOSIS — M7661 Achilles tendinitis, right leg: Secondary | ICD-10-CM

## 2021-11-02 DIAGNOSIS — Z9889 Other specified postprocedural states: Secondary | ICD-10-CM | POA: Diagnosis not present

## 2021-11-02 NOTE — Progress Notes (Signed)
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  She states that it still swells so badly and is exquisitely tender.  Objective: Vital signs are stable she is alert and oriented x3 she still has some muscle mass wasting of her gastroc but appears to be improving.  She has no erythema no edema cellulitis drainage or odor she does have some allodynic type pain but she has good plantar flexion against resistance inversion against resistance.  All signs point to a well-healing surgical foot though she is still exquisitely tender.  Assessment: Cannot rule out a little CRPS.  Plan: Encouraged her to continue physical therapy and aqua therapy I encouraged her to start taking high-level vitamin C up to 4 times normal dose over-the-counter.  I will follow-up with her in 1 month after completing another round of aqua therapy.

## 2021-11-04 ENCOUNTER — Encounter: Payer: Self-pay | Admitting: Family Medicine

## 2021-11-04 ENCOUNTER — Ambulatory Visit (INDEPENDENT_AMBULATORY_CARE_PROVIDER_SITE_OTHER): Payer: 59 | Admitting: Family Medicine

## 2021-11-04 ENCOUNTER — Other Ambulatory Visit: Payer: Self-pay

## 2021-11-04 VITALS — BP 120/72 | HR 70 | Temp 98.0°F | Ht 65.0 in | Wt 173.5 lb

## 2021-11-04 DIAGNOSIS — N3000 Acute cystitis without hematuria: Secondary | ICD-10-CM

## 2021-11-04 DIAGNOSIS — R350 Frequency of micturition: Secondary | ICD-10-CM

## 2021-11-04 LAB — POCT URINALYSIS DIPSTICK
Bilirubin, UA: POSITIVE
Blood, UA: POSITIVE
Glucose, UA: NEGATIVE
Ketones, UA: NEGATIVE
Nitrite, UA: NEGATIVE
Protein, UA: POSITIVE — AB
Spec Grav, UA: 1.02 (ref 1.010–1.025)
Urobilinogen, UA: 1 E.U./dL
pH, UA: 6 (ref 5.0–8.0)

## 2021-11-04 MED ORDER — PHENAZOPYRIDINE HCL 100 MG PO TABS: 100.0000 mg | ORAL_TABLET | Freq: Three times a day (TID) | ORAL | 0 refills | Status: DC | PRN

## 2021-11-04 MED ORDER — AMOXICILLIN-POT CLAVULANATE 875-125 MG PO TABS: 1.0000 | ORAL_TABLET | Freq: Two times a day (BID) | ORAL | 0 refills | Status: DC

## 2021-11-04 NOTE — Patient Instructions (Signed)
Meds have been sent the the pharmacy °You can take tylenol for pain/fevers °If worsening symptoms, let us know or go to the Emergency room  ° ° °

## 2021-11-04 NOTE — Progress Notes (Signed)
Subjective:     Patient ID: Laura Fields, female    DOB: 1961-07-07, 61 y.o.   MRN: [id number]  Chief Complaint  Patient presents with   Urinary Frequency    Sx started 2 days ago Feels like she need to empty bladder, only goes a little bt once she goes      HPI 2 days, freq, urgency, incomplete emptying. No dysuria. This is typical for her. Laura Fields will do this to her and she drank 2.  No fever, but had chills 2 days ago. No n/v. No increase in back pain  Health Maintenance Due  Topic Date Due   Pneumococcal Vaccine 63-7 Years old (1 - PCV) Never done   HIV Screening  Never done   Hepatitis C Screening  Never done   TETANUS/TDAP  Never done   Zoster Vaccines- Shingrix (1 of 2) Never done   INFLUENZA VACCINE  Never done   COVID-19 Vaccine (1) 09/06/2021    Past Medical History:  Diagnosis Date   Asthma    Fibromyalgia    Intrinsic atopic dermatitis 12/01/2020   Sciatica     Past Surgical History:  Procedure Laterality Date   ABDOMINAL HYSTERECTOMY     ACHILLES TENDON REPAIR Right 07/02/2021   AUGMENTATION MAMMAPLASTY Bilateral 1999   Saline/ under muscle   back nerve     block   COLONOSCOPY  09/23/2013   [facility]   GASTRIC BYPASS     KNEE ARTHROSCOPY     mass removable     on thumb   STOMACH SURGERY      Outpatient Medications Prior to Visit  Medication Sig Dispense Refill   albuterol (VENTOLIN HFA) 108 (90 Base) MCG/ACT inhaler INHALE 2 PUFFS INTO THE LUNGS EVERY 4 HOURS AS NEEDED FOR WHEEZE OR FOR SHORTNESS OF BREATH 18 g 1   AMBULATORY NON FORMULARY MEDICATION Apply 1 application topically at bedtime. Medication Name: Compounded Hydroquinone 8%, Tretinoin 0.025%, Kojic Acid 1%, Niacinamide 4%, Fluocinolone 0.025% Cream (S[employer]) 30 g 0   baclofen (LIORESAL) 10 MG tablet Take 10 mg by mouth 3 (three) times daily.     Carbinoxamine Maleate 4 MG TABS TAKE 1 TABLET EVERY 8 HOURS FOR RUNNY NOSE. 270 tablet 1   cyclobenzaprine (FLEXERIL) 5 MG  tablet Take 5 mg by mouth 3 (three) times daily as needed for muscle spasms.     desonide (DESOWEN) 0.05 % ointment APPLY 1 APPLICATION TOPICALLY 2 TIMES DAILY AS NEEDED TO RED ITCHY AREAS TO THE FACE. 45 g 1   DULoxetine (CYMBALTA) 60 MG capsule Take 60 mg by mouth daily.     Epinastine HCl 0.05 % ophthalmic solution Apply to eye as needed.     famotidine (PEPCID) 20 MG tablet TAKE 1 TABLET BY MOUTH TWICE A DAY 180 tablet 0   flunisolide (NASALIDE) 25 MCG/ACT (0.025%) SOLN 2 SPRAYS PER NOSTRIL 2-3 TIMES PER DAY AS NEEDED FOR STUFFY NOSE. 75 mL 1   gabapentin (NEURONTIN) 100 MG capsule Take 1 capsule (100 mg total) by mouth 3 (three) times daily. 90 capsule 3   hydrOXYzine (ATARAX/VISTARIL) 25 MG tablet Take 1 tablet (25 mg total) by mouth 3 (three) times daily as needed for itching. 60 tablet 0   meloxicam (MOBIC) 15 MG tablet Take 15 mg by mouth daily.     montelukast (SINGULAIR) 10 MG tablet TAKE 1 TABLET BY MOUTH EVERYDAY AT BEDTIME 90 tablet 0   Olopatadine HCl (PATADAY) 0.2 % SOLN Place 1  drop into both eyes daily as needed. 2.5 mL 5   Olopatadine HCl 0.6 % SOLN PLACE 2 SPRAYS INTO BOTH NOSTRILS 2 (TWO) TIMES DAILY AS NEEDED. 30.5 g 0   ondansetron (ZOFRAN) 4 MG tablet Take 1 tablet (4 mg total) by mouth every 8 (eight) hours as needed. 20 tablet 0   pantoprazole (PROTONIX) 40 MG tablet Take 1 tablet (40 mg total) by mouth daily. 30 tablet 6   polyethylene glycol powder (GLYCOLAX/MIRALAX) 17 GM/SCOOP powder Take 17g daily 255 g 3   pregabalin (LYRICA) 150 MG capsule Take 150 mg by mouth 2 (two) times daily.     SPIRIVA RESPIMAT 1.25 MCG/ACT AERS INHALE 2 PUFFS BY MOUTH INTO THE LUNGS DAILY 4 g 2   SYMBICORT 160-4.5 MCG/ACT inhaler INHALE 2 PUFFS INTO THE LUNGS TWICE A DAY 10.2 g 2   benzonatate (TESSALON) 100 MG capsule Take 2 capsules (200 mg total) by mouth 3 (three) times daily as needed for cough. (Patient not taking: Reported on 11/04/2021) 90 capsule 0   gabapentin (NEURONTIN) 100 MG  capsule Take by mouth.     No facility-administered medications prior to visit.    Allergies  Allergen Reactions   Rocephin [Ceftriaxone Sodium In Dextrose] Hives   [id number] except as noted in HPI      Objective:     BP 120/72    Pulse 70    Temp 98 F (36.7 C) (Temporal)    Ht 5\' 5"  (1.651 m)    Wt 173 lb 8 oz (78.7 kg)    SpO2 98%    BMI 28.87 kg/m  Wt Readings from Last 3 Encounters:  11/04/21 173 lb 8 oz (78.7 kg)  09/22/21 174 lb (78.9 kg)  06/11/21 150 lb (68 kg)        Gen: WDWN NAD AAF HEENT: NCAT, conjunctiva not injected, sclera nonicteric NECK:  supple, no thyromegaly, no nodes, no carotid bruits CARDIAC: RRR, S1S2+, no murmur. DP 2+B LUNGS: CTAB. No wheezes ABDOMEN:  BS+, soft, NTND, No HSM, no masses. No CVAT EXT:  no edema MSK: no gross abnormalities. Boot R foot NEURO: A&O x3.  CN II-XII intact.  PSYCH: normal mood. Good eye contact   Results for orders placed or performed in visit on 11/04/21  POCT urinalysis dipstick  Result Value Ref Range   Color, UA yellow    Clarity, UA cloudy    Glucose, UA Negative Negative   Bilirubin, UA positive    Ketones, UA neg    Spec Grav, UA 1.020 1.010 - 1.025   Blood, UA positive    pH, UA 6.0 5.0 - 8.0   Protein, UA Positive (A) Negative   Urobilinogen, UA 1.0 0.2 or 1.0 E.U./dL   Nitrite, UA neg    Leukocytes, UA Moderate (2+) (A) Negative   Appearance     Odor      Assessment & Plan:   Problem List Items Addressed This Visit   None Visit Diagnoses     Urine frequency    -  Primary   Relevant Orders   POCT urinalysis dipstick (Completed)   Acute cystitis without hematuria       Relevant Orders   Urine Culture      UTI-augmentin, pyridium.  Inc fluids.  Send cx  Meds ordered this encounter  Medications   amoxicillin-clavulanate (AUGMENTIN) 875-125 MG tablet    Sig: Take 1 tablet by mouth 2 (two) times daily.    Dispense:  14 tablet  Refill:  0   phenazopyridine  (PYRIDIUM) 100 MG tablet    Sig: Take 1 tablet (100 mg total) by mouth 3 (three) times daily as needed for pain.    Dispense:  6 tablet    Refill:  0    Wellington Hampshire, MD

## 2021-11-05 ENCOUNTER — Telehealth: Payer: Self-pay | Admitting: Podiatry

## 2021-11-05 NOTE — Telephone Encounter (Signed)
Laura Fields is scheduled to return to work 11/06/2021, I spoke with her today and she said that you extended her leave for 2 more months, I just wanted to confirm.

## 2021-11-07 ENCOUNTER — Other Ambulatory Visit: Payer: Self-pay | Admitting: Family Medicine

## 2021-11-07 LAB — URINE CULTURE
MICRO NUMBER:: 12862881
SPECIMEN QUALITY:: ADEQUATE

## 2021-11-08 ENCOUNTER — Telehealth: Payer: Self-pay | Admitting: Family Medicine

## 2021-11-08 NOTE — Telephone Encounter (Signed)
Returned call to patient. Gave lab results. Patient verbalized understanding. 

## 2021-11-08 NOTE — Telephone Encounter (Signed)
Pt called and said she missed a call regarding lab results.

## 2021-11-10 ENCOUNTER — Ambulatory Visit: Payer: 59 | Admitting: Physical Therapy

## 2021-11-10 ENCOUNTER — Other Ambulatory Visit: Payer: Self-pay

## 2021-11-10 DIAGNOSIS — M7661 Achilles tendinitis, right leg: Secondary | ICD-10-CM | POA: Diagnosis not present

## 2021-11-10 DIAGNOSIS — M6281 Muscle weakness (generalized): Secondary | ICD-10-CM

## 2021-11-10 DIAGNOSIS — M25571 Pain in right ankle and joints of right foot: Secondary | ICD-10-CM

## 2021-11-10 DIAGNOSIS — R262 Difficulty in walking, not elsewhere classified: Secondary | ICD-10-CM

## 2021-11-10 DIAGNOSIS — R2689 Other abnormalities of gait and mobility: Secondary | ICD-10-CM

## 2021-11-10 NOTE — Therapy (Signed)
[place], [place], [address] Phone: [phone]   Fax:  [phone]  Physical Therapy Treatment  Patient Details  Name: Laura Fields MRN: [id number] Date of Birth: November 17, 1960 Referring Provider (PT): Hyatt,Max   Encounter Date: 11/10/2021   PT End of Session - 11/10/21 1232     Visit Number 11    Number of Visits 22    Date for PT Re-Evaluation 12/02/21    PT Start Time 2122    PT Stop Time 1230    PT Time Calculation (min) 45 min    Activity Tolerance Patient tolerated treatment well    Behavior During Therapy [facility] for tasks assessed/performed             Past Medical History:  Diagnosis Date   Asthma    Fibromyalgia    Intrinsic atopic dermatitis 12/01/2020   Sciatica     Past Surgical History:  Procedure Laterality Date   ABDOMINAL HYSTERECTOMY     ACHILLES TENDON REPAIR Right 07/02/2021   AUGMENTATION MAMMAPLASTY Bilateral 1999   Saline/ under muscle   back nerve     block   COLONOSCOPY  09/23/2013   [facility]   GASTRIC BYPASS     KNEE ARTHROSCOPY     mass removable     on thumb   STOMACH SURGERY      There were no vitals filed for this visit.   Subjective Assessment - 11/10/21 1144     Subjective Pt states "I feel all around better". She was able to wear her shoe for 20 minutes yesterday with minimal swelling    Patient Stated Goals be able to walk and wt bear Rt foot/ankle    Currently in Pain? Yes    Pain Score 3     Pain Location Ankle    Pain Orientation Right    Pain Descriptors / Indicators Sharp    Pain Type Surgical pain                               [facility] Adult PT Treatment/Exercise - 11/10/21 0001       Knee/Hip Exercises: Aerobic   Nustep L5 x 5 min for warm up without boot on      Knee/Hip Exercises: Standing   Lateral Step Up Right;Hand Hold: 0;10 reps;Step Height: 4"    Forward Step Up 10 reps;Hand Hold: 2;Step  Height: 4"    Step Down Right;Hand Hold: 2;Step Height: 4";10 reps      Ankle Exercises: Supine   Other Supine Ankle Exercises ankle AROM for PF/DF and Inv/Ev as tolerated      Ankle Exercises: Seated   Other Seated Ankle Exercises ankle 4 way x 12 red TB      Ankle Exercises: Standing   Other Standing Ankle Exercises gait ladder stepping both feet in each square with focus on Rt wt shifts (unable to do 1 foot in each square). Standing stepping forward/bkwd with Lt LE focus on Rt LE stability and wt shifts                          PT Long Term Goals - 10/22/21 1552       PT LONG TERM GOAL #1   Title Pt will be independent with HEP    Time 6    Period Weeks    Status On-going  Target Date 12/02/21      PT LONG TERM GOAL #2   Title Pt will improve FOTO to >=52 to demo improved functional mobility    Baseline 43: 10/22/21    Time 6    Period Weeks    Status On-going    Target Date 12/02/21      PT LONG TERM GOAL #3   Title Pt will tolerate gait x 50' without AD with pain <= 3/10 to improve household mobility    Baseline can ambulate 50 ft without AD, but pain is 7-9/10    Time 6    Period Weeks    Status Partially Met    Target Date 12/02/21      PT LONG TERM GOAL #4   Title Pt will improve Rt ankle strength to 4/5 to tolerate gait and mobility    Time 6    Period Weeks    Status Partially Met    Target Date 12/02/21                   Plan - 11/10/21 1232     Clinical Impression Statement Pt with decreased pain and better able to tolerate standing exercises and gait training today. She has significant pain with step through gait pattern so focused on pregait training, wt shifts and stairs as tolerated. Pt is improving activity tolerance, strength and balance    PT Next Visit Plan gait and balance    PT Home Exercise Plan [id number]    Consulted and Agree with Plan of Care Patient             Patient will benefit from skilled  therapeutic intervention in order to improve the following deficits and impairments:     Visit Diagnosis: Pain in right ankle and joints of right foot  Other abnormalities of gait and mobility  Difficulty in walking, not elsewhere classified  Muscle weakness (generalized)     Problem List Patient Active Problem List   Diagnosis Date Noted   Left hand weakness 10/11/2021   Numbness and tingling in left hand 09/02/2021   Gastroesophageal reflux disease 07/28/2021   Intrinsic atopic dermatitis 12/01/2020   Dysfunction of both eustachian tubes 12/01/2020   Pruritic rash 02/12/2020   Allergic conjunctivitis of both eyes 02/12/2020   Seasonal allergic rhinitis with a nonallergic component 12/25/2019   Allergic conjunctivitis 12/25/2019   Moderate persistent asthma without complication 67/89/3810    Kashara Blocher, PT 11/10/2021, 12:34 PM  [place], [place], [address] Phone: [phone]   Fax:  [phone]  Name: Laura Fields MRN: [id number] Date of Birth: 1960-12-13

## 2021-11-12 ENCOUNTER — Other Ambulatory Visit: Payer: Self-pay

## 2021-11-12 ENCOUNTER — Ambulatory Visit: Payer: 59 | Admitting: Physical Therapy

## 2021-11-12 DIAGNOSIS — M6281 Muscle weakness (generalized): Secondary | ICD-10-CM

## 2021-11-12 DIAGNOSIS — M25571 Pain in right ankle and joints of right foot: Secondary | ICD-10-CM

## 2021-11-12 DIAGNOSIS — R2689 Other abnormalities of gait and mobility: Secondary | ICD-10-CM

## 2021-11-12 DIAGNOSIS — M7661 Achilles tendinitis, right leg: Secondary | ICD-10-CM | POA: Diagnosis not present

## 2021-11-12 DIAGNOSIS — R262 Difficulty in walking, not elsewhere classified: Secondary | ICD-10-CM

## 2021-11-12 NOTE — Therapy (Signed)
[facility] [place] [address] [place], [place], [address] Phone: [phone]   Fax:  [phone]  Physical Therapy Treatment  Patient Details  Name: Laura Fields MRN: [id number] Date of Birth: 09-19-1961 Referring Provider (PT): Hyatt,Max   Encounter Date: 11/12/2021   PT End of Session - 11/12/21 1224     Visit Number 12    Number of Visits 22    Date for PT Re-Evaluation 12/02/21    PT Start Time 1142    PT Stop Time 1225    PT Time Calculation (min) 43 min    Activity Tolerance Patient tolerated treatment well    Behavior During Therapy [facility] for tasks assessed/performed             Past Medical History:  Diagnosis Date   Asthma    Fibromyalgia    Intrinsic atopic dermatitis 12/01/2020   Sciatica     Past Surgical History:  Procedure Laterality Date   ABDOMINAL HYSTERECTOMY     ACHILLES TENDON REPAIR Right 07/02/2021   AUGMENTATION MAMMAPLASTY Bilateral 1999   Saline/ under muscle   back nerve     block   COLONOSCOPY  09/23/2013   [facility]   GASTRIC BYPASS     KNEE ARTHROSCOPY     mass removable     on thumb   STOMACH SURGERY      There were no vitals filed for this visit.   Subjective Assessment - 11/12/21 1141     Subjective Pt states "i feel more swollen today but it's ok"    Patient Stated Goals be able to walk and wt bear Rt foot/ankle    Currently in Pain? Yes    Pain Score 8     Pain Location Ankle    Pain Orientation Right    Pain Descriptors / Indicators Sharp    Pain Type Surgical pain                [facility] PT Assessment - 11/12/21 0001       Assessment   Medical Diagnosis Achilles repair    Referring Provider (PT) Hyatt,Max    Onset Date/Surgical Date 07/02/21      Precautions   Precaution Comments Rt boot      Restrictions   RLE Weight Bearing Weight bearing as tolerated      Strength   Right Ankle Dorsiflexion 4/5    Right Ankle Plantar Flexion 3+/5     Right Ankle Inversion 4-/5    Right Ankle Eversion 4/5                           [facility] Adult PT Treatment/Exercise - 11/12/21 0001       Knee/Hip Exercises: Aerobic   Nustep L5 x 5 min for warm up without boot on      Knee/Hip Exercises: Standing   Lateral Step Up Right;10 reps;Hand Hold: 0;Step Height: 4"    Forward Step Up 10 reps;Hand Hold: 0;Step Height: 4"    Step Down Hand Hold: 2;10 reps;Step Height: 4"      Manual Therapy   Joint Mobilization grade 1-2 metatarsal mobs to reduce pain and improve mobility    Soft tissue mobilization STM Rt anterior tib      Ankle Exercises: Standing   Other Standing Ankle Exercises sidestep and backward walking 40' x 4 with focus on improvin gait fluidity      Ankle Exercises: Seated  BAPS Sitting;Level 4;15 reps    Other Seated Ankle Exercises ankle 4 way x 12 red TB    Other Seated Ankle Exercises rockerboard PF/DF x 1 min      Ankle Exercises: Supine   Other Supine Ankle Exercises ankle AROM for PF/DF and Inv/Ev as tolerated                          PT Long Term Goals - 10/22/21 1552       PT LONG TERM GOAL #1   Title Pt will be independent with HEP    Time 6    Period Weeks    Status On-going    Target Date 12/02/21      PT LONG TERM GOAL #2   Title Pt will improve FOTO to >=52 to demo improved functional mobility    Baseline 43: 10/22/21    Time 6    Period Weeks    Status On-going    Target Date 12/02/21      PT LONG TERM GOAL #3   Title Pt will tolerate gait x 50' without AD with pain <= 3/10 to improve household mobility    Baseline can ambulate 50 ft without AD, but pain is 7-9/10    Time 6    Period Weeks    Status Partially Met    Target Date 12/02/21      PT LONG TERM GOAL #4   Title Pt will improve Rt ankle strength to 4/5 to tolerate gait and mobility    Time 6    Period Weeks    Status Partially Met    Target Date 12/02/21                   Plan -  11/12/21 1225     Clinical Impression Statement Pt reports increased pain but is able to tolerate standing gait training and therex today. She will benefit from continued standing strength and gait training    PT Next Visit Plan gait and balance    PT Home Exercise Plan [id number]    Consulted and Agree with Plan of Care Patient             Patient will benefit from skilled therapeutic intervention in order to improve the following deficits and impairments:     Visit Diagnosis: Pain in right ankle and joints of right foot  Other abnormalities of gait and mobility  Difficulty in walking, not elsewhere classified  Muscle weakness (generalized)     Problem List Patient Active Problem List   Diagnosis Date Noted   Left hand weakness 10/11/2021   Numbness and tingling in left hand 09/02/2021   Gastroesophageal reflux disease 07/28/2021   Intrinsic atopic dermatitis 12/01/2020   Dysfunction of both eustachian tubes 12/01/2020   Pruritic rash 02/12/2020   Allergic conjunctivitis of both eyes 02/12/2020   Seasonal allergic rhinitis with a nonallergic component 12/25/2019   Allergic conjunctivitis 12/25/2019   Moderate persistent asthma without complication 45/12/8880    Kiyara Bouffard, PT 11/12/2021, 12:26 PM  [place], [place], [address] Phone: [phone]   Fax:  [phone]  Name: Laura Fields MRN: [id number] Date of Birth: 1960/12/28

## 2021-11-16 ENCOUNTER — Ambulatory Visit: Payer: 59 | Admitting: Physical Therapy

## 2021-11-17 ENCOUNTER — Ambulatory Visit (INDEPENDENT_AMBULATORY_CARE_PROVIDER_SITE_OTHER): Payer: 59 | Admitting: Dermatology

## 2021-11-17 ENCOUNTER — Other Ambulatory Visit: Payer: Self-pay

## 2021-11-17 ENCOUNTER — Encounter: Payer: Self-pay | Admitting: Dermatology

## 2021-11-17 DIAGNOSIS — L729 Follicular cyst of the skin and subcutaneous tissue, unspecified: Secondary | ICD-10-CM | POA: Diagnosis not present

## 2021-11-17 NOTE — Patient Instructions (Signed)
F8101 cyst removal

## 2021-11-19 ENCOUNTER — Ambulatory Visit: Payer: 59 | Admitting: Physical Therapy

## 2021-11-19 ENCOUNTER — Other Ambulatory Visit: Payer: Self-pay

## 2021-11-19 ENCOUNTER — Encounter: Payer: Self-pay | Admitting: Physical Therapy

## 2021-11-19 DIAGNOSIS — M25571 Pain in right ankle and joints of right foot: Secondary | ICD-10-CM

## 2021-11-19 DIAGNOSIS — R2689 Other abnormalities of gait and mobility: Secondary | ICD-10-CM

## 2021-11-19 DIAGNOSIS — M7661 Achilles tendinitis, right leg: Secondary | ICD-10-CM | POA: Diagnosis not present

## 2021-11-19 DIAGNOSIS — M6281 Muscle weakness (generalized): Secondary | ICD-10-CM

## 2021-11-19 DIAGNOSIS — R262 Difficulty in walking, not elsewhere classified: Secondary | ICD-10-CM

## 2021-11-19 NOTE — Therapy (Signed)
[place], [place], [address] Phone: [phone]   Fax:  [phone]  Physical Therapy Treatment   Patient Details  Name: Laura Fields MRN: [id number] Date of Birth: July 21, 1961 Referring Provider (PT): Hyatt,Max   Encounter Date: 11/19/2021   PT End of Session - 11/19/21 1303     Visit Number 13    Number of Visits 22    Date for PT Re-Evaluation 12/02/21    PT Start Time 1301    PT Stop Time 1345    PT Time Calculation (min) 44 min    Activity Tolerance Patient tolerated treatment well    Behavior During Therapy [facility] for tasks assessed/performed             Past Medical History:  Diagnosis Date   Asthma    Fibromyalgia    Intrinsic atopic dermatitis 12/01/2020   Sciatica     Past Surgical History:  Procedure Laterality Date   ABDOMINAL HYSTERECTOMY     ACHILLES TENDON REPAIR Right 07/02/2021   AUGMENTATION MAMMAPLASTY Bilateral 1999   Saline/ under muscle   back nerve     block   COLONOSCOPY  09/23/2013   [facility]   GASTRIC BYPASS     KNEE ARTHROSCOPY     mass removable     on thumb   STOMACH SURGERY     Subjective: Pt reports that her Rt ankle is not swelling up as much.  She continues to wear compression socks.  She is wearing shoe 30 min/day (15 min AM/ 15 in PM).  She returns to MD on 12/07/21.   Pain:  4/10 in Rt posterior ankle; achy.  Worsened by weather and standing too long; Improved with ice and elevation.   Pt seen for aquatic therapy today.  Treatment took place in water 3.25-4 ft in depth at the [employer] pool. Temp of water was 85.  Pt entered/exited the pool via stairs with independently and step to pattern, with bilat rail.  Treatment:   Seated on bench in water:   Ankle circles CW/CCW,  ankle inversion, eversion.    With UE support on pool edge:  Heel/toe raises x 15 ; SLS with intermittent UE support pool x 15 sec each leg, 2 reps. Lt  SLS with Rt lower leg egg beater CW/CCW. Rt gastroc stretch x 20 sec x 2, Rt/Lt soleus stretch x 20 sec x 2.  With UE support on floatation device:  Holding onto floatation barbell:   forward and backward gait, side stepping,  high knee marching forward and backward, semi-tandem gait forward/ backward.Marland Kitchen  all with cues to increase toe flexion during toe off, increased heel strike. Squats pushing dumbell under water x 12 reps  Once out of the water - KT tape applied to posterior surface of Rt calf to calcaneus (as in previous sessions) to decompress tissue and increase proprioception.    Pt requires buoyancy for support and to offload joints with strengthening exercises. Viscosity of the water is needed for resistance of strengthening; water current perturbations provides challenge to standing balance unsupported, requiring increased core activation.     PT Long Term Goals - 10/22/21 1552       PT LONG TERM GOAL #1   Title Pt will be independent with HEP    Time 6    Period Weeks    Status On-going    Target Date 12/02/21      PT LONG TERM GOAL #2  Title Pt will improve FOTO to >=52 to demo improved functional mobility    Baseline 43: 10/22/21    Time 6    Period Weeks    Status On-going    Target Date 12/02/21      PT LONG TERM GOAL #3   Title Pt will tolerate gait x 50' without AD with pain <= 3/10 to improve household mobility    Baseline can ambulate 50 ft without AD, but pain is 7-9/10    Time 6    Period Weeks    Status Partially Met    Target Date 12/02/21      PT LONG TERM GOAL #4   Title Pt will improve Rt ankle strength to 4/5 to tolerate gait and mobility    Time 6    Period Weeks    Status Partially Met    Target Date 12/02/21                   Plan - 11/19/21 1333     Clinical Impression Statement Pt reporting reduction of pain to 2/10 during aquatic exercises.  She reports some "pulling" in Achilles region with heel raises; tolerating this  exercise better with increased lift off demonstrated. Gait quality improving with moderate cues.  Progressing well towards remaining goals.    Rehab Potential Good    PT Frequency 2x / week    PT Duration 6 weeks    PT Treatment/Interventions Aquatic Therapy;Cryotherapy;Moist Heat;Iontophoresis 41m/ml Dexamethasone;Electrical Stimulation;Gait training;Stair training;Functional mobility training;Neuromuscular re-education;Balance training;Therapeutic exercise;Therapeutic activities;Patient/family education;Manual techniques;Dry needling;Taping;Vasopneumatic Device    PT Next Visit Plan gait and balance, FOTO (already loaded)    PT Home Exercise Plan [id number]    Consulted and Agree with Plan of Care Patient              Patient will benefit from skilled therapeutic intervention in order to improve the following deficits and impairments:  Pain, Decreased strength, Decreased activity tolerance, Decreased range of motion, Increased edema, Impaired sensation, Difficulty walking, Abnormal gait, Decreased balance, Decreased mobility  Visit Diagnosis: Pain in right ankle and joints of right foot  Other abnormalities of gait and mobility  Difficulty in walking, not elsewhere classified  Muscle weakness (generalized)     Problem List Patient Active Problem List   Diagnosis Date Noted   Left hand weakness 10/11/2021   Numbness and tingling in left hand 09/02/2021   Gastroesophageal reflux disease 07/28/2021   Intrinsic atopic dermatitis 12/01/2020   Dysfunction of both eustachian tubes 12/01/2020   Pruritic rash 02/12/2020   Allergic conjunctivitis of both eyes 02/12/2020   Seasonal allergic rhinitis with a nonallergic component 12/25/2019   Allergic conjunctivitis 12/25/2019   Moderate persistent asthma without complication 077/82/4235   JKerin Perna PTA 11/19/21 2:03 PM   [address] [address] [address]Phone: [phone]  Fax:  [phone]

## 2021-11-22 ENCOUNTER — Other Ambulatory Visit: Payer: Self-pay

## 2021-11-22 ENCOUNTER — Ambulatory Visit: Payer: 59 | Admitting: Physical Therapy

## 2021-11-22 DIAGNOSIS — M6281 Muscle weakness (generalized): Secondary | ICD-10-CM

## 2021-11-22 DIAGNOSIS — M7661 Achilles tendinitis, right leg: Secondary | ICD-10-CM | POA: Diagnosis not present

## 2021-11-22 DIAGNOSIS — R262 Difficulty in walking, not elsewhere classified: Secondary | ICD-10-CM

## 2021-11-22 DIAGNOSIS — M25571 Pain in right ankle and joints of right foot: Secondary | ICD-10-CM

## 2021-11-22 DIAGNOSIS — R2689 Other abnormalities of gait and mobility: Secondary | ICD-10-CM

## 2021-11-22 NOTE — Therapy (Signed)
[facility], [place], [address] Phone: [phone]   Fax:  [phone]  Physical Therapy Treatment  Patient Details  Name: Africa Masaki MRN: [id number] Date of Birth: 12-18-1960 Referring Provider (PT): Hyatt,Max   Encounter Date: 11/22/2021   PT End of Session - 11/22/21 1139     Visit Number 14    Number of Visits 22    Date for PT Re-Evaluation 12/02/21    PT Start Time 1101    PT Stop Time 1139    PT Time Calculation (min) 38 min    Activity Tolerance Patient limited by pain    Behavior During Therapy [facility] for tasks assessed/performed             Past Medical History:  Diagnosis Date   Asthma    Fibromyalgia    Intrinsic atopic dermatitis 12/01/2020   Sciatica     Past Surgical History:  Procedure Laterality Date   ABDOMINAL HYSTERECTOMY     ACHILLES TENDON REPAIR Right 07/02/2021   AUGMENTATION MAMMAPLASTY Bilateral 1999   Saline/ under muscle   back nerve     block   COLONOSCOPY  09/23/2013   [facility]   GASTRIC BYPASS     KNEE ARTHROSCOPY     mass removable     on thumb   STOMACH SURGERY      There were no vitals filed for this visit.   Subjective Assessment - 11/22/21 1104     Subjective "Friday afternoon after 6pm my ankle swelled".  Pt states she has had more swelling in ankle since then, and is unable to get shoe on Rt foot.    Currently in Pain? Yes    Pain Score 8     Pain Location Ankle    Pain Orientation Right    Pain Descriptors / Indicators Throbbing;Aching    Aggravating Factors  WB    Pain Relieving Factors ice, rock tape                [facility] PT Assessment - 11/22/21 0001       Assessment   Medical Diagnosis Achilles repair    Referring Provider (PT) Hyatt,Max    Onset Date/Surgical Date 07/02/21    Next MD Visit 12/07/21      Restrictions   RLE Weight Bearing Weight bearing as tolerated      AROM   Right Ankle Dorsiflexion 9     Right Ankle Plantar Flexion 59    Right Ankle Inversion 39    Right Ankle Eversion 15                           [facility] Adult PT Treatment/Exercise - 11/22/21 0001       Ambulation/Gait   Ambulation Distance (Feet) 40 Feet    Assistive device 1 person hand held assist    Gait Pattern Step-through pattern;Decreased dorsiflexion - right;Decreased dorsiflexion - left;Antalgic;Decreased stance time - right    Ambulation Surface Level;Indoor    Gait velocity slowed.    Stairs Assistance 5: Supervision    Stairs Assistance Details (indicate cue type and reason) Rt step up -6" x 2, Lt step down 4" x 3 with heavy UE on rail    Stair Management Technique Two rails;Step to pattern;Forwards    Number of Stairs 5    Gait Comments cues for increased heel strike bilat, even weight shift .  Knee/Hip Exercises: Stretches   Passive Hamstring Stretch Right;2 reps;30 seconds    Piriformis Stretch Right;Left;1 rep;20 seconds    Gastroc Stretch Right;2 reps;30 seconds   with strap     Knee/Hip Exercises: Supine   Bridges 1 set;10 reps    Straight Leg Raises Right;1 set;15 reps      Knee/Hip Exercises: Sidelying   Hip ABduction Strengthening;Right;1 set;15 reps      Kinesiotix   Create Space I strip of reg Rock tape applied to Rt distal calf to calcaneus.  Perpendicular strips placed at calcaneus and over incision in midcalf (50% stretch) to decompress tissue and increase proprioception.      Ankle Exercises: Supine   Other Supine Ankle Exercises Rt ankle eversion x 15, DF x 15 with green band      Ankle Exercises: Seated   Ankle Circles/Pumps AROM;Right;10 reps and Inversion / eversion x 10                          PT Long Term Goals - 10/22/21 1552       PT LONG TERM GOAL #1   Title Pt will be independent with HEP    Time 6    Period Weeks    Status On-going    Target Date 12/02/21      PT LONG TERM GOAL #2   Title Pt will improve FOTO to >=52  to demo improved functional mobility    Baseline 43: 10/22/21    Time 6    Period Weeks    Status On-going    Target Date 12/02/21      PT LONG TERM GOAL #3   Title Pt will tolerate gait x 50' without AD with pain <= 3/10 to improve household mobility    Baseline can ambulate 50 ft without AD, but pain is 7-9/10    Time 6    Period Weeks    Status Partially Met    Target Date 12/02/21      PT LONG TERM GOAL #4   Title Pt will improve Rt ankle strength to 4/5 to tolerate gait and mobility    Time 6    Period Weeks    Status Partially Met    Target Date 12/02/21                   Plan - 11/22/21 1148     Clinical Impression Statement Pt reported increased pain in Rt lateral ankle with seated exercises and short gait trial.  She is tender to touch in Rt posterior lower leg.  Elevated pain level since evening following aquatic exercises 3 days ago. Pt was able to don shoe prior to gait trial, but returned to boot at end of session.  Pt's Rt ankle ROM much improved since last assesment.  No new goals met this visit. since flare up of pain; progressing towards remaining LTGs.    Rehab Potential Good    PT Frequency 2x / week    PT Duration 6 weeks    PT Treatment/Interventions Aquatic Therapy;Cryotherapy;Moist Heat;Iontophoresis 4mg /ml Dexamethasone;Electrical Stimulation;Gait training;Stair training;Functional mobility training;Neuromuscular re-education;Balance training;Therapeutic exercise;Therapeutic activities;Patient/family education;Manual techniques;Dry needling;Taping;Vasopneumatic Device    PT Next Visit Plan gait and balance, MMT ankle strength for goal.    PT Home Exercise Plan [id number]    Consulted and Agree with Plan of Care Patient             Patient will benefit from skilled therapeutic  intervention in order to improve the following deficits and impairments:  Pain, Decreased strength, Decreased activity tolerance, Decreased range of motion, Increased  edema, Impaired sensation, Difficulty walking, Abnormal gait, Decreased balance, Decreased mobility  Visit Diagnosis: Pain in right ankle and joints of right foot  Other abnormalities of gait and mobility  Difficulty in walking, not elsewhere classified  Muscle weakness (generalized)     Problem List Patient Active Problem List   Diagnosis Date Noted   Left hand weakness 10/11/2021   Numbness and tingling in left hand 09/02/2021   Gastroesophageal reflux disease 07/28/2021   Intrinsic atopic dermatitis 12/01/2020   Dysfunction of both eustachian tubes 12/01/2020   Pruritic rash 02/12/2020   Allergic conjunctivitis of both eyes 02/12/2020   Seasonal allergic rhinitis with a nonallergic component 12/25/2019   Allergic conjunctivitis 12/25/2019   Moderate persistent asthma without complication 49/35/5217   Kerin Perna, PTA 11/22/21 1:02 PM    [place], [place], [address] Phone: [phone]   Fax:  [phone]  Name: Jullian Previti MRN: [id number] Date of Birth: Mar 13, 1961

## 2021-11-24 ENCOUNTER — Ambulatory Visit: Payer: 59 | Admitting: Physical Therapy

## 2021-11-25 ENCOUNTER — Other Ambulatory Visit: Payer: Self-pay

## 2021-11-25 ENCOUNTER — Other Ambulatory Visit: Payer: Self-pay | Admitting: Family Medicine

## 2021-11-25 ENCOUNTER — Encounter: Payer: Self-pay | Admitting: Family Medicine

## 2021-11-25 ENCOUNTER — Other Ambulatory Visit: Payer: Self-pay | Admitting: *Deleted

## 2021-11-25 ENCOUNTER — Telehealth: Payer: Self-pay | Admitting: Family Medicine

## 2021-11-25 ENCOUNTER — Telehealth: Payer: Self-pay | Admitting: Dermatology

## 2021-11-25 ENCOUNTER — Ambulatory Visit (INDEPENDENT_AMBULATORY_CARE_PROVIDER_SITE_OTHER): Payer: 59 | Admitting: Family Medicine

## 2021-11-25 VITALS — BP 110/82 | HR 68 | Temp 97.7°F

## 2021-11-25 DIAGNOSIS — H1013 Acute atopic conjunctivitis, bilateral: Secondary | ICD-10-CM

## 2021-11-25 DIAGNOSIS — J3089 Other allergic rhinitis: Secondary | ICD-10-CM

## 2021-11-25 DIAGNOSIS — K219 Gastro-esophageal reflux disease without esophagitis: Secondary | ICD-10-CM

## 2021-11-25 DIAGNOSIS — J4541 Moderate persistent asthma with (acute) exacerbation: Secondary | ICD-10-CM

## 2021-11-25 MED ORDER — AZELASTINE HCL 0.1 % NA SOLN: 2.0000 | Freq: Two times a day (BID) | NASAL | 5 refills | Status: DC | PRN

## 2021-11-25 MED ORDER — AZELASTINE HCL 0.15 % NA SOLN: 2.0000 | Freq: Two times a day (BID) | NASAL | 1 refills | Status: DC | PRN

## 2021-11-25 NOTE — Progress Notes (Signed)
[place] [address] Dept: [phone]  FOLLOW UP NOTE  Patient ID: Laura Fields, female    DOB: 15-Jul-1961  Age: 61 y.o. MRN: [id number] Date of Office Visit: 11/25/2021  Assessment  Chief Complaint: Asthma  HPI Laura Fields is a 61 year old female who presents to the clinic for follow-up visit.  She was last seen in this clinic on 07/28/2021 by Gareth Morgan, FNP, for evaluation of asthma, allergic rhinitis, allergic conjunctivitis, atopic dermatitis, reflux, and eustachian tube dysfunction.  She did receive her influenza split-dose vaccine on 08/25/2021 with no adverse reaction.  Of note, she did take Augmentin on 11/17/2021 which she reports was prescribed for a urinary tract infection.  At today's visit, she reports her asthma has been poorly controlled over the last several days with symptoms including chest tightness, shortness of breath with activity, wheezing occurring this morning, and productive cough producing clear to light yellow mucus.  She continues montelukast 10 mg once a day, Symbicort 160-2 puffs twice a day, Spiriva 1.25 mcg 2 puffs once a day, and has been using albuterol frequently over the last few days with moderate relief of symptoms.  Allergic rhinitis is reported as poorly controlled with symptoms including clear rhinorrhea, nasal congestion, sneezing, and postnasal drainage.  She continues carbinoxamine 4 mg twice a day, flunisolide, and saline nasal rinses with moderate to mild relief of symptoms. Environmental allergy skin testing was on 12/25/2019 and was positive to grass pollen and weed pollen. Allergic conjunctivitis is reported as moderately well controlled with symptoms including red and itchy eyes for which she uses epinastine eyedrops with relief of symptoms.  Atopic dermatitis is reported as well controlled with a daily moisturizing routine in addition to desonide 0.05% ointment as needed with resolution of symptoms.  Reflux is reported as well  controlled with no symptoms including heartburn or vomiting.  She is not currently taking famotidine.  Her current medications are listed in the chart.    Drug Allergies:  Allergies  Allergen Reactions   Rocephin [Ceftriaxone Sodium In Dextrose] Hives    Physical Exam: BP 110/82    Pulse 68    Temp 97.7 F (36.5 C) (Temporal)    SpO2 100%    Physical Exam Vitals reviewed.  Constitutional:      Appearance: Normal appearance.  HENT:     Head: Normocephalic and atraumatic.     Right Ear: Tympanic membrane normal.     Left Ear: Tympanic membrane normal.     Nose:     Comments: Bilateral nares slightly erythematous with thin clear nasal drainage noted.  Pharynx normal.  Ears normal.  Eyes normal.    Mouth/Throat:     Pharynx: Oropharynx is clear.  Eyes:     Conjunctiva/sclera: Conjunctivae normal.  Cardiovascular:     Rate and Rhythm: Normal rate and regular rhythm.     Heart sounds: Normal heart sounds. No murmur heard. Pulmonary:     Effort: Pulmonary effort is normal.     Breath sounds: Normal breath sounds.     Comments: Lungs clear to auscultation. Musculoskeletal:        General: Normal range of motion.     Cervical back: Normal range of motion and neck supple.  Skin:    General: Skin is warm and dry.  Neurological:     Mental Status: She is alert and oriented to person, place, and time.  Psychiatric:        Mood and Affect: Mood normal.  Behavior: Behavior normal.        Thought Content: Thought content normal.        Judgment: Judgment normal.    Diagnostics: Patient refused spirometry due to chest tightness  Assessment and Plan: 1. Moderate persistent asthma with acute exacerbation   2. Seasonal allergic rhinitis with a nonallergic component   3. Allergic conjunctivitis of both eyes   4. Gastroesophageal reflux disease, unspecified whether esophagitis present     Meds ordered this encounter  Medications   Azelastine HCl 0.15 % SOLN    Sig: Place  2 sprays into the nose 2 (two) times daily as needed.    Dispense:  30 mL    Refill:  1    Patient Instructions  Asthma Begin prednisone 10 mg tablets. Take 2 tablets twice a day for 3 days, then take 2 tablets once a day for 1 day, then take 1 tablet on the 5th day, then stop  Continue montelukast 10 mg once a day to prevent cough or wheeze Continue Symbicort 160-2 puffs twice a day with a spacer to prevent cough and wheeze Continue Spiriva 2 puffs once a day to prevent cough or wheeze Continue albuterol 2 puffs every 4 hours as needed for cough or wheeze OR Instead use albuterol 0.083% solution via nebulizer one unit vial every 4 hours as needed for cough or wheeze Consider Tespire injections for better asthma control  Allergic rhinitis Continue avoidance measures directed toward grass pollen and weed pollen as listed below Increase carbinoxamine 4 mg tablets to 2 tablets twice a day as needed for nasal symptoms. Remember to rotate to a different antihistamine about every 3 months. Some examples of over the counter antihistamines include Zyrtec (cetirizine), Xyzal (levocetirizine), Allegra (fexofenadine), and Claritin (loratidine).  Begin azelastine 2 sprays in each nostril twice a day as needed for a runny nose Continue flunisolide- 2 sprays in each nostril 2 to 3 times a day.  In the right nostril, point the applicator out toward the right ear. In the left nostril, point the applicator out toward the left ear Consider saline nasal rinses as needed for nasal symptoms. Use this before any medicated nasal sprays for best result Consider allergen immunotherapy if medications are not controlling your symptoms of allergic rhinitis. Call the clinic to schedule your first allergy injection  Allergic conjunctivitis Continue epinastine eye drops 1 drop in each eye twice a day as needed for red, itchy eyes.  Atopic dermatitis Continue a daily moisturizing routine Continue desonide 0.05% ointment  to red, itchy areas twice a day as needed. Do not use this medication longer than 3 weeks in a row.   Reflux Begin dietary lifestyle modifications as listed below Continue to follow up with your gastroenterologist for evaluation and treatment of GI issues  Eustachian tube dysfunction Continue flunisolide nasal spray and nasal saline rinses as listed above  Call the clinic if this treatment plan is not working well for you  Follow up in 1 month or sooner if needed.   Return in about 4 weeks (around 12/23/2021), or if symptoms worsen or fail to improve.    Thank you for the opportunity to care for this patient.  Please do not hesitate to contact me with questions.  Gareth Morgan, FNP [facility]

## 2021-11-25 NOTE — Telephone Encounter (Signed)
Per Thurston Hole okay to give her a new pred pak due to pt losing the pred pak that was previously given in the clinic today. Pt will come by office to pick up.

## 2021-11-25 NOTE — Telephone Encounter (Signed)
Patient called stating the sample of prednisone given to her by Thermon Leyland was lost asking for a Rx to be called in to the pharmacy please advise

## 2021-11-25 NOTE — Patient Instructions (Addendum)
Asthma Begin prednisone 10 mg tablets. Take 2 tablets twice a day for 3 days, then take 2 tablets once a day for 1 day, then take 1 tablet on the 5th day, then stop  Continue montelukast 10 mg once a day to prevent cough or wheeze Continue Symbicort 160-2 puffs twice a day with a spacer to prevent cough and wheeze Continue Spiriva 2 puffs once a day to prevent cough or wheeze Continue albuterol 2 puffs every 4 hours as needed for cough or wheeze OR Instead use albuterol 0.083% solution via nebulizer one unit vial every 4 hours as needed for cough or wheeze Consider Tespire injections for better asthma control  Allergic rhinitis Continue avoidance measures directed toward grass pollen and weed pollen as listed below Increase carbinoxamine 4 mg tablets to 2 tablets twice a day as needed for nasal symptoms. Remember to rotate to a different antihistamine about every 3 months. Some examples of over the counter antihistamines include Zyrtec (cetirizine), Xyzal (levocetirizine), Allegra (fexofenadine), and Claritin (loratidine).  Begin azelastine 2 sprays in each nostril twice a day as needed for a runny nose Continue flunisolide- 2 sprays in each nostril 2 to 3 times a day.  In the right nostril, point the applicator out toward the right ear. In the left nostril, point the applicator out toward the left ear Consider saline nasal rinses as needed for nasal symptoms. Use this before any medicated nasal sprays for best result Consider allergen immunotherapy if medications are not controlling your symptoms of allergic rhinitis. Call the clinic to schedule your first allergy injection  Allergic conjunctivitis Continue epinastine eye drops 1 drop in each eye twice a day as needed for red, itchy eyes.  Atopic dermatitis Continue a daily moisturizing routine Continue desonide 0.05% ointment to red, itchy areas twice a day as needed. Do not use this medication longer than 3 weeks in a row.   Reflux Begin  dietary lifestyle modifications as listed below Continue to follow up with your gastroenterologist for evaluation and treatment of GI issues  Eustachian tube dysfunction Continue flunisolide nasal spray and nasal saline rinses as listed above  Call the clinic if this treatment plan is not working well for you  Follow up in 1 month or sooner if needed.  Reducing Pollen Exposure The American Academy of Allergy, Asthma and Immunology suggests the following steps to reduce your exposure to pollen during allergy seasons. Do not hang sheets or clothing out to dry; pollen may collect on these items. Do not mow lawns or spend time around freshly cut grass; mowing stirs up pollen. Keep windows closed at night.  Keep car windows closed while driving. Minimize morning activities outdoors, a time when pollen counts are usually at their highest. Stay indoors as much as possible when pollen counts or humidity is high and on windy days when pollen tends to remain in the air longer. Use air conditioning when possible.  Many air conditioners have filters that trap the pollen spores. Use a HEPA room air filter to remove pollen form the indoor air you breathe.   Lifestyle Changes for Controlling GERD When you have GERD, stomach acid feels as if its backing up toward your mouth. Whether or not you take medication to control your GERD, your symptoms can often be improved with lifestyle changes.   Raise Your Head Reflux is more likely to strike when youre lying down flat, because stomach fluid can flow backward more easily. Raising the head of your bed 4-6 inches  can help. To do this: Slide blocks or books under the legs at the head of your bed. Or, place a wedge under the mattress. Many foam stores can make a suitable wedge for you. The wedge should run from your waist to the top of your head. Dont just prop your head on several pillows. This increases pressure on your stomach. It can make GERD  worse.  Watch Your Eating Habits Certain foods may increase the acid in your stomach or relax the lower esophageal sphincter, making GERD more likely. Its best to avoid the following: Coffee, tea, and carbonated drinks (with and without caffeine) Fatty, fried, or spicy food Mint, chocolate, onions, and tomatoes Any other foods that seem to irritate your stomach or cause you pain  Relieve the Pressure Eat smaller meals, even if you have to eat more often. Dont lie down right after you eat. Wait a few hours for your stomach to empty. Avoid tight belts and tight-fitting clothes. Lose excess weight.  Tobacco and Alcohol Avoid smoking tobacco and drinking alcohol. They can make GERD symptoms worse.

## 2021-11-25 NOTE — Telephone Encounter (Signed)
Phone call to patient with the code that she needed. Patient aware.

## 2021-11-25 NOTE — Telephone Encounter (Signed)
Needs CPT codes for surgery scheduled for 3/2 to give to insurance

## 2021-11-26 ENCOUNTER — Telehealth: Payer: Self-pay

## 2021-11-26 ENCOUNTER — Ambulatory Visit (INDEPENDENT_AMBULATORY_CARE_PROVIDER_SITE_OTHER): Payer: 59 | Admitting: Orthopedic Surgery

## 2021-11-26 ENCOUNTER — Other Ambulatory Visit: Payer: Self-pay

## 2021-11-26 DIAGNOSIS — R29898 Other symptoms and signs involving the musculoskeletal system: Secondary | ICD-10-CM

## 2021-11-26 DIAGNOSIS — R252 Cramp and spasm: Secondary | ICD-10-CM

## 2021-11-26 DIAGNOSIS — R200 Anesthesia of skin: Secondary | ICD-10-CM

## 2021-11-26 DIAGNOSIS — M25542 Pain in joints of left hand: Secondary | ICD-10-CM | POA: Diagnosis not present

## 2021-11-26 DIAGNOSIS — M79642 Pain in left hand: Secondary | ICD-10-CM

## 2021-11-26 NOTE — Telephone Encounter (Signed)
Patient states she hasn't heard anything from PT to be scheduled

## 2021-11-26 NOTE — Progress Notes (Signed)
Office Visit Note   Patient: Laura Fields           Date of Birth: 1961-06-04           MRN: [id number] Visit Date: 11/26/2021              Requested by: Marrian Salvage, [address] [place],  [place] [address] PCP: Marrian Salvage, FNP   Assessment & Plan: Visit Diagnoses:  1. Hand cramps     Plan: Patient still complains of intermittent pain in the palm of her hand and describes that her index and middle finger will "lock up".  She showed me a video taken of 1 of these events which looks like cramping of the distal forearm and hand.  Her exam today is totally normal.  We discussed the nature of hand cramps and preventive strategies such as staying hydrated and stretching the hand intermittently during activities.  We discussed the role of hand therapy for "writer's cramps."  At this point, she wants to continue to monitor her symptoms and may decide to do formal therapy down the line.  Follow-Up Instructions: No follow-ups on file.   Orders:  No orders of the defined types were placed in this encounter.  No orders of the defined types were placed in this encounter.     Procedures: No procedures performed   Clinical Data: No additional findings.   Subjective: Chief Complaint  Patient presents with   Left Hand - Follow-up    This is a 61 year old right-hand-dominant female who presents for follow-up of left hand weakness.  She describes pain in her distal forearm and hand that is intermittent and is associated with "locking up" of her index and middle fingers.  This happens randomly when she is doing activity such as cooking cleaning or otherwise using her hands.  There are no certain activities that bring this on.  She showed a video of one of his events which showed fasciculations or twitching of some of the forearm musculature that look like a cramp.  She denies any continued numbness or paresthesias.   Review of  Systems   Objective: Vital Signs: There were no vitals taken for this visit.  Physical Exam  Left Hand Exam   Tenderness  The patient is experiencing no tenderness.   Range of Motion  The patient has normal left wrist ROM.  Muscle Strength  The patient has normal left wrist strength.  Other  Erythema: absent Sensation: normal Pulse: present     Specialty Comments:  No specialty comments available.  Imaging: No results found.   PMFS History: Patient Active Problem List   Diagnosis Date Noted   Hand cramps 11/26/2021   Left hand weakness 10/11/2021   Numbness and tingling in left hand 09/02/2021   Gastroesophageal reflux disease 07/28/2021   Intrinsic atopic dermatitis 12/01/2020   Dysfunction of both eustachian tubes 12/01/2020   Pruritic rash 02/12/2020   Allergic conjunctivitis of both eyes 02/12/2020   Seasonal allergic rhinitis with a nonallergic component 12/25/2019   Allergic conjunctivitis 12/25/2019   Moderate persistent asthma without complication [id number]   Past Medical History:  Diagnosis Date   Asthma    Fibromyalgia    Intrinsic atopic dermatitis 12/01/2020   Sciatica     Family History  Problem Relation Age of Onset   Hypertension Mother    Allergic rhinitis Neg Hx    Angioedema Neg Hx    Asthma Neg Hx  Eczema Neg Hx    Immunodeficiency Neg Hx    Urticaria Neg Hx     Past Surgical History:  Procedure Laterality Date   ABDOMINAL HYSTERECTOMY     ACHILLES TENDON REPAIR Right 07/02/2021   AUGMENTATION MAMMAPLASTY Bilateral 1999   Saline/ under muscle   back nerve     block   COLONOSCOPY  09/23/2013   [facility]   GASTRIC BYPASS     KNEE ARTHROSCOPY     mass removable     on thumb   STOMACH SURGERY     Social History   Occupational History   Not on file  Tobacco Use   Smoking status: Never    Passive exposure: Yes   Smokeless tobacco: Never  Vaping Use   Vaping Use: Never used  Substance and Sexual Activity    Alcohol use: Yes   Drug use: Never   Sexual activity: Not on file

## 2021-11-30 ENCOUNTER — Other Ambulatory Visit: Payer: Self-pay

## 2021-11-30 ENCOUNTER — Ambulatory Visit (AMBULATORY_SURGERY_CENTER): Payer: 59 | Admitting: Gastroenterology

## 2021-11-30 ENCOUNTER — Encounter: Payer: Self-pay | Admitting: Gastroenterology

## 2021-11-30 VITALS — BP 151/93 | HR 60 | Temp 97.5°F | Resp 15 | Ht 65.0 in | Wt 173.0 lb

## 2021-11-30 DIAGNOSIS — R1030 Lower abdominal pain, unspecified: Secondary | ICD-10-CM | POA: Diagnosis not present

## 2021-11-30 DIAGNOSIS — K581 Irritable bowel syndrome with constipation: Secondary | ICD-10-CM

## 2021-11-30 DIAGNOSIS — K5900 Constipation, unspecified: Secondary | ICD-10-CM

## 2021-11-30 DIAGNOSIS — K640 First degree hemorrhoids: Secondary | ICD-10-CM | POA: Diagnosis not present

## 2021-11-30 DIAGNOSIS — K2970 Gastritis, unspecified, without bleeding: Secondary | ICD-10-CM | POA: Diagnosis not present

## 2021-11-30 DIAGNOSIS — Z9884 Bariatric surgery status: Secondary | ICD-10-CM

## 2021-11-30 DIAGNOSIS — R1013 Epigastric pain: Secondary | ICD-10-CM

## 2021-11-30 HISTORY — PX: OTHER SURGICAL HISTORY: SHX169

## 2021-11-30 MED ORDER — SODIUM CHLORIDE 0.9 % IV SOLN: 500.0000 mL | Freq: Once | INTRAVENOUS | Status: DC

## 2021-11-30 NOTE — Patient Instructions (Signed)
Please read handouts provided. Continue present medications. Await pathology results. Miralax 1 cupful ( 17 grams ) in 8 ounces of water daily. Resume previous diet, eat small but frequent meals.   YOU HAD AN ENDOSCOPIC PROCEDURE TODAY AT THE [facility]:   Refer to the procedure report that was given to you for any specific questions about what was found during the examination.  If the procedure report does not answer your questions, please call your gastroenterologist to clarify.  If you requested that your care partner not be given the details of your procedure findings, then the procedure report has been included in a sealed envelope for you to review at your convenience later.  YOU SHOULD EXPECT: Some feelings of bloating in the abdomen. Passage of more gas than usual.  Walking can help get rid of the air that was put into your GI tract during the procedure and reduce the bloating. If you had a lower endoscopy (such as a colonoscopy or flexible sigmoidoscopy) you may notice spotting of blood in your stool or on the toilet paper. If you underwent a bowel prep for your procedure, you may not have a normal bowel movement for a few days.  Please Note:  You might notice some irritation and congestion in your nose or some drainage.  This is from the oxygen used during your procedure.  There is no need for concern and it should clear up in a day or so.  SYMPTOMS TO REPORT IMMEDIATELY:  Following lower endoscopy (colonoscopy or flexible sigmoidoscopy):  Excessive amounts of blood in the stool  Significant tenderness or worsening of abdominal pains  Swelling of the abdomen that is new, acute  Fever of 100F or higher  Following upper endoscopy (EGD)  Vomiting of blood or coffee ground material  New chest pain or pain under the shoulder blades  Painful or persistently difficult swallowing  New shortness of breath  Fever of 100F or higher  Black, tarry-looking stools  For urgent  or emergent issues, a gastroenterologist can be reached at any hour by calling [phone]. Do not use MyChart messaging for urgent concerns.    DIET:  We do recommend a small meal at first, but then you may proceed to your regular diet.  Drink plenty of fluids but you should avoid alcoholic beverages for 24 hours.  ACTIVITY:  You should plan to take it easy for the rest of today and you should NOT DRIVE or use heavy machinery until tomorrow (because of the sedation medicines used during the test).    FOLLOW UP: Our staff will call the number listed on your records 48-72 hours following your procedure to check on you and address any questions or concerns that you may have regarding the information given to you following your procedure. If we do not reach you, we will leave a message.  We will attempt to reach you two times.  During this call, we will ask if you have developed any symptoms of COVID 19. If you develop any symptoms (ie: fever, flu-like symptoms, shortness of breath, cough etc.) before then, please call [phone].  If you test positive for Covid 19 in the 2 weeks post procedure, please call and report this information to [place].    If any biopsies were taken you will be contacted by phone or by letter within the next 1-3 weeks.  Please call us at [phone] if you have not heard about the biopsies in 3 weeks.  SIGNATURES/CONFIDENTIALITY: You and/or your care partner have signed paperwork which will be entered into your electronic medical record.  These signatures attest to the fact that that the information above on your After Visit Summary has been reviewed and is understood.  Full responsibility of the confidentiality of this discharge information lies with you and/or your care-partner.

## 2021-11-30 NOTE — Progress Notes (Signed)
Chief Complaint:   Referring Provider:  Marrian Salvage,*      ASSESSMENT AND PLAN;   #1. Epi pain. H/O gastric bypass  #2. IBS-C.   #3.  Lower abdo pain with neg CT AP 05/2021   Plan: -Protonix 40mg  po qd #30, 6 refills -EGD/colon 2 day prep   -Miralax 17g po qd.   I discussed EGD/Colonoscopy- the indications, risks, alternatives and potential complications including, but not limited to, bleeding, infection, reaction to medication, damage to internal organs, cardiac and/or pulmonary problems, and perforation requiring surgery (1 to 2 in 1000). The possibility that significant findings could be missed was explained. All ? were answered. The patient gives consent to proceed. HPI:    Laura Fields is a 61 y.o. female  H/O Gastric Bypass 2005, asthma, chronic low back pain d/t DJD, osteoarthritis, hysterectomy.  With postprandial epigastric pain associated with belching.  No significant heartburn.  No odynophagia or dysphagia.  She does complain of nausea and occasional vomiting especially after eating.  She has been taking nonsteroidals for osteoarthritis/DJD.  No melena or hematochezia.  No hematemesis.  Longstanding H/O constipation- x over 40 years (had constipation when she was pregnant with her son who is 97 years old currently), with pellet-like stools, abdominal bloating, lower abdominal discomfort.  Lately has increased water intake and has been taking high-fiber diet with resultant BMs 1/day.  She would occasionally have to bend backwards or forwards and perform abdominal massage to have bowel movements.  Is blessed with 38 son (12 year old)-was a large baby.  Patient did have vaginal and rectal tear requiring stitches during childbirth.  Subsequently had hysterectomy due to dysfunctional uterine bleeding.  Seen in ED on 06/11/2021 with acute abdominal pain.  Negative CT Abdo/pelvis as below.  Has significant discharge thru umbilicus.-She is scheduled to see  general surgery for the same.  Weight does fluctuate.   Wt Readings from Last 3 Encounters:  11/30/21 173 lb (78.5 kg)  11/04/21 173 lb 8 oz (78.7 kg)  09/22/21 174 lb (78.9 kg)   Previous GI work-up: Colon 09/2013 ([facility]): neg. Per report-rectosigmoid/sigmoid passage of scope required returning back with abdominal pressure to traverse through this area because of looping secondary to underlying scar tissue from prior surgery.  CT Abdo/pelvis with p.o. and IV contrast 06/11/2021 -No acute abnormality -Fatty liver, mild -Normal gallbladder -S/p gastric bypass, hysterectomy -DJD spine   SH- one son 66 yrs, moved from [facility]   Past Medical History:  Diagnosis Date   Allergy    Arthritis    Asthma    Fibromyalgia    Intrinsic atopic dermatitis 12/01/2020   Osteoporosis    Raynaud's disease    Sciatica     Past Surgical History:  Procedure Laterality Date   ABDOMINAL HYSTERECTOMY     ACHILLES TENDON REPAIR Right 07/02/2021   AUGMENTATION MAMMAPLASTY Bilateral 1999   Saline/ under muscle   back nerve     block   COLONOSCOPY  09/23/2013   [facility]   colonoscopy with endo  11/30/2021   GASTRIC BYPASS     KNEE ARTHROSCOPY     mass removable     on thumb   STOMACH SURGERY      Family History  Problem Relation Age of Onset   Hypertension Mother    Stomach cancer Other    Rectal cancer Other    Esophageal cancer Other    Colon polyps Other    Colon cancer Other  Allergic rhinitis Neg Hx    Angioedema Neg Hx    Asthma Neg Hx    Eczema Neg Hx    Immunodeficiency Neg Hx    Urticaria Neg Hx     Social History   Tobacco Use   Smoking status: Never    Passive exposure: Yes   Smokeless tobacco: Never  Vaping Use   Vaping Use: Never used  Substance Use Topics   Alcohol use: Yes    Comment: monthly or less   Drug use: Never    Current Outpatient Medications  Medication Sig Dispense Refill   albuterol (VENTOLIN HFA) 108 (90  Base) MCG/ACT inhaler INHALE 2 PUFFS INTO THE LUNGS EVERY 4 HOURS AS NEEDED FOR WHEEZE OR FOR SHORTNESS OF BREATH 18 each 0   baclofen (LIORESAL) 10 MG tablet Take 10 mg by mouth 3 (three) times daily.     cyclobenzaprine (FLEXERIL) 5 MG tablet Take 5 mg by mouth 3 (three) times daily as needed for muscle spasms. At night time     DULoxetine (CYMBALTA) 60 MG capsule Take 60 mg by mouth daily.     flunisolide (NASALIDE) 25 MCG/ACT (0.025%) SOLN 2 SPRAYS PER NOSTRIL 2-3 TIMES PER DAY AS NEEDED FOR STUFFY NOSE. 75 mL 1   gabapentin (NEURONTIN) 100 MG capsule Take 1 capsule (100 mg total) by mouth 3 (three) times daily. (Patient taking differently: Take 100 mg by mouth as needed.) 90 capsule 3   meloxicam (MOBIC) 15 MG tablet Take 15 mg by mouth daily.     montelukast (SINGULAIR) 10 MG tablet TAKE 1 TABLET BY MOUTH EVERYDAY AT BEDTIME 90 tablet 0   Olopatadine HCl 0.6 % SOLN PLACE 2 SPRAYS INTO BOTH NOSTRILS 2 (TWO) TIMES DAILY AS NEEDED. 30.5 g 0   pantoprazole (PROTONIX) 40 MG tablet Take 1 tablet (40 mg total) by mouth daily. 30 tablet 6   polyethylene glycol powder (GLYCOLAX/MIRALAX) 17 GM/SCOOP powder Take 17g daily 255 g 3   pregabalin (LYRICA) 150 MG capsule Take 150 mg by mouth 2 (two) times daily.     SYMBICORT 160-4.5 MCG/ACT inhaler INHALE 2 PUFFS INTO THE LUNGS TWICE A DAY 10.2 g 2   AMBULATORY NON FORMULARY MEDICATION Apply 1 application topically at bedtime. Medication Name: Compounded Hydroquinone 8%, Tretinoin 0.025%, Kojic Acid 1%, Niacinamide 4%, Fluocinolone 0.025% Cream ([employer]) 30 g 0   azelastine (ASTELIN) 0.1 % nasal spray Place 2 sprays into both nostrils 2 (two) times daily as needed for rhinitis. 30 mL 5   Azelastine HCl 0.15 % SOLN Place 2 sprays into the nose 2 (two) times daily as needed. 30 mL 1   benzonatate (TESSALON) 100 MG capsule Take 2 capsules (200 mg total) by mouth 3 (three) times daily as needed for cough. 90 capsule 0   Carbinoxamine Maleate 4 MG TABS  TAKE 1 TABLET EVERY 8 HOURS FOR RUNNY NOSE. 270 tablet 1   desonide (DESOWEN) 0.05 % ointment APPLY 1 APPLICATION TOPICALLY 2 TIMES DAILY AS NEEDED TO RED ITCHY AREAS TO THE FACE. 45 g 0   Epinastine HCl 0.05 % ophthalmic solution Apply to eye as needed.     famotidine (PEPCID) 20 MG tablet TAKE 1 TABLET BY MOUTH TWICE A DAY 180 tablet 0   hydrOXYzine (ATARAX/VISTARIL) 25 MG tablet Take 1 tablet (25 mg total) by mouth 3 (three) times daily as needed for itching. 60 tablet 0   Olopatadine HCl (PATADAY) 0.2 % SOLN Place 1 drop into both eyes daily as needed.  2.5 mL 5   ondansetron (ZOFRAN) 4 MG tablet Take 1 tablet (4 mg total) by mouth every 8 (eight) hours as needed. 20 tablet 0   phenazopyridine (PYRIDIUM) 100 MG tablet Take 1 tablet (100 mg total) by mouth 3 (three) times daily as needed for pain. (Patient not taking: Reported on 11/25/2021) 6 tablet 0   SPIRIVA RESPIMAT 1.25 MCG/ACT AERS INHALE 2 PUFFS BY MOUTH INTO THE LUNGS DAILY 4 g 2   Current Facility-Administered Medications  Medication Dose Route Frequency Provider Last Rate Last Admin   0.9 %  sodium chloride infusion  500 mL Intravenous Once Jackquline Denmark, MD        Allergies  Allergen Reactions   Rocephin [Ceftriaxone Sodium In Dextrose] Hives    Review of Systems:  Constitutional: Denies fever, chills, diaphoresis, appetite change and  has fatigue.  HEENT: Has allergies Respiratory: Denies SOB, DOE, cough, chest tightness,  and wheezing.   Cardiovascular: Denies chest pain, palpitations and leg swelling.  Genitourinary: Denies dysuria, urgency, frequency, hematuria, flank pain and difficulty urinating.  Musculoskeletal: has  myalgias, back pain, joint swelling, arthralgias and gait problem. ?  Raynaud's.  Negative rheumatology work-up. Skin: No rash.  Neurological: Denies dizziness, seizures, syncope, weakness, light-headedness, numbness and has headaches.  Hematological: Denies adenopathy. Easy bruising, personal or family  bleeding history  Psychiatric/Behavioral: Has anxiety, no depression     Physical Exam:    BP (!) 140/94    Pulse 80    Temp (!) 97.5 F (36.4 C) (Temporal)    Ht 5\' 5"  (1.651 m)    Wt 173 lb (78.5 kg)    SpO2 100%    BMI 28.79 kg/m  Wt Readings from Last 3 Encounters:  11/30/21 173 lb (78.5 kg)  11/04/21 173 lb 8 oz (78.7 kg)  09/22/21 174 lb (78.9 kg)   Constitutional:  Well-developed, in no acute distress. Psychiatric: Normal mood and affect. Behavior is normal. HEENT: Pupils normal.  Conjunctivae are normal. No scleral icterus. Cardiovascular: Normal rate, regular rhythm. No edema Pulmonary/chest: Effort normal and breath sounds normal. No wheezing, rales or rhonchi. Abdominal: Soft, nondistended. Nontender. Bowel sounds active throughout. There are no masses palpable. No hepatomegaly.  Umbilicus appears to be normal.  No definite discharge. Rectal: Deferred Neurological: Alert and oriented to person place and time. Skin: Skin is warm and dry. No rashes noted.  Data Reviewed: I have personally reviewed following labs and imaging studies  CBC: CBC Latest Ref Rng & Units 06/11/2021 09/01/2020  WBC 4.0 - 10.5 K/uL 8.4 6.2  Hemoglobin 12.0 - 15.0 g/dL 13.1 13.2  Hematocrit 36.0 - 46.0 % 40.9 38.5  Platelets 150 - 400 K/uL 408(H) 446    CMP: CMP Latest Ref Rng & Units 06/11/2021  Glucose 70 - 99 mg/dL 122(H)  BUN 6 - 20 mg/dL 5(L)  Creatinine 0.44 - 1.00 mg/dL 0.80  Sodium 135 - 145 mmol/L 129(L)  Potassium 3.5 - 5.1 mmol/L 3.3(L)  Chloride 98 - 111 mmol/L 99  CO2 22 - 32 mmol/L 19(L)  Calcium 8.9 - 10.3 mg/dL 8.7(L)  Total Protein 6.5 - 8.1 g/dL 7.2  Total Bilirubin 0.3 - 1.2 mg/dL 0.8  Alkaline Phos 38 - 126 U/L 69  AST 15 - 41 U/L 21  ALT 0 - 44 U/L 11     Radiology Studies: CT reviewed    Carmell Austria, MD 11/30/2021, 8:04 AM  Cc: Marrian Salvage,*

## 2021-11-30 NOTE — Progress Notes (Signed)
Reviewed medical record and updated  Vitals SM

## 2021-11-30 NOTE — Op Note (Signed)
[facility] Patient Name: Laura Fields Procedure Date: 11/30/2021 7:28 AM MRN: [id number] Endoscopist: Lynann Bologna , MD Age: 61 Referring MD:  Date of Birth: 1961-04-12 Gender: Female Account #: [id number] Procedure:                Colonoscopy Indications:              Lower abdominal pain with constipation Medicines:                Monitored Anesthesia Care Procedure:                Pre-Anesthesia Assessment:                           - Prior to the procedure, a History and Physical                            was performed, and patient medications and                            allergies were reviewed. The patient's tolerance of                            previous anesthesia was also reviewed. The risks                            and benefits of the procedure and the sedation                            options and risks were discussed with the patient.                            All questions were answered, and informed consent                            was obtained. Prior Anticoagulants: The patient has                            taken no previous anticoagulant or antiplatelet                            agents. ASA Grade Assessment: II - A patient with                            mild systemic disease. After reviewing the risks                            and benefits, the patient was deemed in                            satisfactory condition to undergo the procedure.                           After obtaining informed consent, the colonoscope  was passed under direct vision. Throughout the                            procedure, the patient's blood pressure, pulse, and                            oxygen saturations were monitored continuously. The                            [id number]-HQ190L Colonoscope was introduced through the                            anus and advanced to the 2 cm into the ileum. The                            colonoscopy was  performed without difficulty. The                            patient tolerated the procedure well. The quality                            of the bowel preparation was adequate to identify                            polyps 6 mm and larger in size. The terminal ileum,                            ileocecal valve, appendiceal orifice, and rectum                            were photographed. Scope In: 8:18:36 AM Scope Out: 8:36:44 AM Scope Withdrawal Time: 0 hours 11 minutes 14 seconds  Total Procedure Duration: 0 hours 18 minutes 8 seconds  Findings:                 The colon (entire examined portion) was                            significantly redundant. The quality of preparation                            did limit visualization despite 2-day prep. Solid                            vegetable material which would clog the suction                            channel of the scope. Nevertheless, aggressive                            suctioning and aspiration was performed. Overall                            the examination was adequate.  Non-bleeding internal hemorrhoids were found during                            retroflexion. The hemorrhoids were small and Grade                            I (internal hemorrhoids that do not prolapse).                           The terminal ileum appeared normal.                           The exam was otherwise without abnormality on                            direct and retroflexion views. Complications:            No immediate complications. Estimated Blood Loss:     Estimated blood loss: none. Impression:               - Redundant colon.                           - Non-bleeding internal hemorrhoids.                           - The examined portion of the ileum was normal.                           - The examination was otherwise normal on direct                            and retroflexion views.                           - No  specimens collected. Recommendation:           - Patient has a contact number available for                            emergencies. The signs and symptoms of potential                            delayed complications were discussed with the                            patient. Return to normal activities tomorrow.                            Written discharge instructions were provided to the                            patient.                           - Resume previous diet.                           -  Miralax 1 capful (17 grams) in 8 ounces of water                            PO daily.                           - Repeat colonoscopy in 10 years for screening                            purposes. Certainly earlier, if with any new                            problems or change in family history.                           - The findings and recommendations were discussed                            with the designated responsible adult. Lynann Bologna, MD 11/30/2021 8:48:55 AM This report has been signed electronically.

## 2021-11-30 NOTE — Progress Notes (Signed)
Report to PACU, RN, vss, BBS= Clear.  

## 2021-11-30 NOTE — Op Note (Signed)
[place] Patient Name: Laura Fields Procedure Date: 11/30/2021 7:29 AM MRN: [id number] Endoscopist: Jackquline Denmark , MD Age: 61 Referring MD:  Date of Birth: Feb 08, 1961 Gender: Female Account #: [id number] Procedure:                Upper GI endoscopy Indications:              Epigastric abdominal pain Medicines:                Monitored Anesthesia Care Procedure:                Pre-Anesthesia Assessment:                           - Prior to the procedure, a History and Physical                            was performed, and patient medications and                            allergies were reviewed. The patient's tolerance of                            previous anesthesia was also reviewed. The risks                            and benefits of the procedure and the sedation                            options and risks were discussed with the patient.                            All questions were answered, and informed consent                            was obtained. Prior Anticoagulants: The patient has                            taken no previous anticoagulant or antiplatelet                            agents. ASA Grade Assessment: II - A patient with                            mild systemic disease. After reviewing the risks                            and benefits, the patient was deemed in                            satisfactory condition to undergo the procedure.                           After obtaining informed consent, the endoscope was  passed under direct vision. Throughout the                            procedure, the patient's blood pressure, pulse, and                            oxygen saturations were monitored continuously. The                            GIF [id number] [id number] was introduced through the                            mouth, and advanced to the proximal jejunum. The                            upper GI endoscopy was accomplished  without                            difficulty. The patient tolerated the procedure                            well. Scope In: Scope Out: Findings:                 The examined esophagus was normal with well-defined                            Z-line.                           Typical Roux-en-Y anatomy. Gastric remnant                            extending from 40 up to 45 cm. Mild gastritis.                            Multiple biopsies were obtained to rule out H.                            pylori. The gastrojejunal anastomosis was                            characterized by healthy appearing mucosa. The                            jejunojejunal anastomosis was characterized by                            healthy appearing mucosa. The duodenum-to-jejunum                            limb was examined 40 cm from the anastomosis and                            was characterized by healthy appearing mucosa. Complications:  No immediate complications. Estimated Blood Loss:     Estimated blood loss: none. Impression:               - Roux-en-Y anatomy.                           - Mild gastritis. Recommendation:           - Patient has a contact number available for                            emergencies. The signs and symptoms of potential                            delayed complications were discussed with the                            patient. Return to normal activities tomorrow.                            Written discharge instructions were provided to the                            patient.                           - Resume previous diet. Small but frequent meals.                           - Continue present medications.                           - Await pathology results.                           - The findings and recommendations were discussed                            with the designated responsible adult. Jackquline Denmark, MD 11/30/2021 8:44:20 AM This report has been signed  electronically.

## 2021-12-01 ENCOUNTER — Ambulatory Visit: Payer: 59 | Admitting: Physical Therapy

## 2021-12-02 ENCOUNTER — Telehealth: Payer: Self-pay | Admitting: *Deleted

## 2021-12-02 ENCOUNTER — Telehealth: Payer: Self-pay

## 2021-12-02 ENCOUNTER — Encounter: Payer: Self-pay | Admitting: Family Medicine

## 2021-12-02 ENCOUNTER — Other Ambulatory Visit: Payer: Self-pay | Admitting: *Deleted

## 2021-12-02 ENCOUNTER — Other Ambulatory Visit: Payer: Self-pay

## 2021-12-02 ENCOUNTER — Ambulatory Visit (INDEPENDENT_AMBULATORY_CARE_PROVIDER_SITE_OTHER): Payer: 59 | Admitting: Family Medicine

## 2021-12-02 VITALS — BP 138/88 | Temp 97.9°F | Ht 65.0 in | Wt 166.2 lb

## 2021-12-02 DIAGNOSIS — R350 Frequency of micturition: Secondary | ICD-10-CM | POA: Diagnosis not present

## 2021-12-02 LAB — POCT URINALYSIS DIPSTICK
Bilirubin, UA: NEGATIVE
Glucose, UA: NEGATIVE
Ketones, UA: NEGATIVE
Nitrite, UA: POSITIVE
Protein, UA: NEGATIVE
Spec Grav, UA: 1.015 (ref 1.010–1.025)
Urobilinogen, UA: 0.2 E.U./dL
pH, UA: 6 (ref 5.0–8.0)

## 2021-12-02 MED ORDER — CIPROFLOXACIN HCL 500 MG PO TABS: 500.0000 mg | ORAL_TABLET | Freq: Two times a day (BID) | ORAL | 0 refills | Status: AC

## 2021-12-02 MED ORDER — PHENAZOPYRIDINE HCL 100 MG PO TABS: 100.0000 mg | ORAL_TABLET | Freq: Three times a day (TID) | ORAL | 0 refills | Status: AC | PRN

## 2021-12-02 MED ORDER — CIPROFLOXACIN HCL 500 MG PO TABS: 500.0000 mg | ORAL_TABLET | Freq: Two times a day (BID) | ORAL | 0 refills | Status: DC

## 2021-12-02 NOTE — Progress Notes (Signed)
° °  Laura Fields is a 61 y.o. female who presents today for an office visit.  Assessment/Plan:  UTI UA and history consistent with UTI.  No signs of systemic illness though she is having some back pain.  We will start Cipro.  Most recent urine culture shows Cipro sensitive Proteus Mirabillis.  Encouraged hydration.  We will check urine culture.  We discussed reasons to return to care.    Subjective:  HPI:  Patient here with UTI symptoms. This started about a month ago. Her symptom included urgency, back pain, dysuria and some discomfort. She saw Dr. Vaughan Sine on 11/04/2021 for this issue. At that time, she was given Augmentin 875-125 mg and Pyridium 100 mg. She had some allergic reaction to the medication.  Symptoms never fully went away.  Today, she notes still have some back pain. She described pain as "pulling sensation". She thinks her symptoms are similar to the previous UTI. No hematuria Does have some fever and chills.         Objective:  Physical Exam: There were no vitals taken for this visit.  Gen: No acute distress, resting comfortably CV: Regular rate and rhythm with no murmurs appreciated Pulm: Normal work of breathing, clear to auscultation bilaterally with no crackles, wheezes, or rhonchi MSK: No CVA tenderness. Neuro: Grossly normal, moves all extremities Psych: Normal affect and thought content       I,Savera Zaman,acting as a scribe for Dimas Chyle, MD.,have documented all relevant documentation on the behalf of Dimas Chyle, MD,as directed by  Dimas Chyle, MD while in the presence of Dimas Chyle, MD.   I, Dimas Chyle, MD, have reviewed all documentation for this visit. The documentation on 12/02/21 for the exam, diagnosis, procedures, and orders are all accurate and complete.  Algis Greenhouse. Jerline Pain, MD 12/02/2021 9:47 AM

## 2021-12-02 NOTE — Telephone Encounter (Signed)
New prescription Rx cipro send to pharmacy

## 2021-12-02 NOTE — Patient Instructions (Signed)
It was nice to see you!  You have a urinary tract infection. Please start the antibiotic.  We will check a urine culture to make sure you do not have a resistant bacteria. We will call you if we need to change your medications.   Please make sure you are drinking plenty of fluids over the next few days.  If your symptoms do not improve over the next 5-7 days, or if they worsen, please let us know. Please also let us know if you have worsening back pain, fevers, chills, or body aches.   Take care, Dr Griselda Bramblett  

## 2021-12-02 NOTE — Telephone Encounter (Signed)
°  Follow up Call-  Call back number 11/30/2021  Post procedure Call Back phone  # [phone]  Permission to leave phone message Yes  Some recent data might be hidden     Patient questions:  Do you have a fever, pain , or abdominal swelling? No. Pain Score  0 *  Have you tolerated food without any problems? Yes.    Have you been able to return to your normal activities? Yes.    Do you have any questions about your discharge instructions: Diet   No. Medications  No. Follow up visit  No.  Do you have questions or concerns about your Care? No.  Actions: * If pain score is 4 or above: No action needed, pain <4.

## 2021-12-02 NOTE — Telephone Encounter (Signed)
Patient has called in very upset stating quantity for Cipro is not correct.  Please follow up with patient in regard.

## 2021-12-03 ENCOUNTER — Encounter: Payer: Self-pay | Admitting: Family

## 2021-12-03 ENCOUNTER — Other Ambulatory Visit: Payer: Self-pay | Admitting: Family

## 2021-12-03 ENCOUNTER — Encounter: Payer: Self-pay | Admitting: Physical Therapy

## 2021-12-03 ENCOUNTER — Ambulatory Visit: Payer: 59 | Attending: Podiatry | Admitting: Physical Therapy

## 2021-12-03 DIAGNOSIS — M25571 Pain in right ankle and joints of right foot: Secondary | ICD-10-CM | POA: Insufficient documentation

## 2021-12-03 DIAGNOSIS — M6281 Muscle weakness (generalized): Secondary | ICD-10-CM | POA: Diagnosis present

## 2021-12-03 DIAGNOSIS — Q899 Congenital malformation, unspecified: Secondary | ICD-10-CM

## 2021-12-03 DIAGNOSIS — R2689 Other abnormalities of gait and mobility: Secondary | ICD-10-CM | POA: Diagnosis present

## 2021-12-03 DIAGNOSIS — R262 Difficulty in walking, not elsewhere classified: Secondary | ICD-10-CM | POA: Insufficient documentation

## 2021-12-03 DIAGNOSIS — G8929 Other chronic pain: Secondary | ICD-10-CM

## 2021-12-03 NOTE — Therapy (Signed)
[place], [place], [address] Phone: [phone]   Fax:  [phone]  Physical Therapy Treatment and Recertificiation  Patient Details  Name: Laura Fields MRN: [id number] Date of Birth: [birth year] Referring Provider (PT): Hyatt,Max   Encounter Date: 12/03/2021   PT End of Session - 12/03/21 1351     Visit Number 15    Number of Visits 22    PT Start Time 1311    PT Stop Time 8366    PT Time Calculation (min) 41 min    Activity Tolerance Patient limited by pain    Behavior During Therapy [facility] for tasks assessed/performed             Past Medical History:  Diagnosis Date   Allergy    Arthritis    Asthma    Fibromyalgia    Intrinsic atopic dermatitis 12/01/2020   Osteoporosis    Raynaud's disease    Sciatica     Past Surgical History:  Procedure Laterality Date   ABDOMINAL HYSTERECTOMY     ACHILLES TENDON REPAIR Right 07/02/2021   AUGMENTATION MAMMAPLASTY Bilateral 1999   Saline/ under muscle   back nerve     block   COLONOSCOPY  09/23/2013   [facility]   colonoscopy with endo  11/30/2021   GASTRIC BYPASS     KNEE ARTHROSCOPY     mass removable     on thumb   STOMACH SURGERY      There were no vitals filed for this visit.   Subjective Assessment - 12/03/21 1315     Subjective Pt reports she is working hard to wear shoe for 1/2 day.  She complains of pain and weakness in the Rt lateral/ ant ankle when in standing.  She reports that her Rt ankle doesn't swell as often as it previously did.  She continues to ice 2x/day.    Currently in Pain? Yes    Pain Score 9     Pain Location Ankle    Pain Orientation Right    Pain Descriptors / Indicators Aching;Throbbing    Aggravating Factors  up on feet too long    Pain Relieving Factors ice, rest                [facility] PT Assessment - 12/03/21 0001       Assessment   Medical Diagnosis Achilles repair    Referring  Provider (PT) Hyatt,Max    Onset Date/Surgical Date 07/02/21    Next MD Visit 12/07/21      Restrictions   RLE Weight Bearing Weight bearing as tolerated      Observation/Other Assessments   Focus on Therapeutic Outcomes (FOTO)  45 functional status (goal 58)      Strength   Right/Left Ankle Right    Right Ankle Dorsiflexion 4/5    Right Ankle Plantar Flexion 3+/5    Right Ankle Inversion 3+/5    Right Ankle Eversion 4-/5               [facility] Adult PT Treatment/Exercise - 12/03/21 0001       Ambulation/Gait   Ambulation/Gait Yes    Ambulation/Gait Assistance 5: Supervision    Ambulation Distance (Feet) 40 Feet    Assistive device None    Gait Pattern Step-through pattern;Decreased stance time - right;Decreased dorsiflexion - right;Decreased dorsiflexion - left;Antalgic    Ambulation Surface Indoor;Level    Gait velocity slowed    Gait Comments cues for heel  strike and roll through, for even weight shift.      Knee/Hip Exercises: Standing   Forward Step Up Right;1 set;15 reps;Hand Hold: 1;Step Height: 4"      Manual Therapy   Soft tissue mobilization STM / IASTM to Rt calf and tissue adjacent to R Achilles tendon, to decrease fascial restrictions and improve mobility.      Kinesiotix   Create Space I strip of reg Rock tape applied to Rt distal calf to calcaneus.  Perpendicular strips placed at calcaneus and over incision in midcalf (50% stretch), I strip across ant R ankle -  to decompress tissue and increase proprioception.      Ankle Exercises: Stretches   Other Stretch incline board on L2: 20 sec x 3 reps; 2 reps with bent knees   limted tolerance for soleus stretch     Ankle Exercises: Seated   Ankle Circles/Pumps AROM;Right;10 reps    Other Seated Ankle Exercises Rt ankle eversion, inversion and DF with red band x 10;  Rt ankle PF with green band x 10 reps      Ankle Exercises: Aerobic   Nustep L5 x 5 min for warm up                           PT Long Term Goals - 12/03/21 1332       PT LONG TERM GOAL #1   Title Pt will be independent with HEP    Time 6    Period Weeks    Status Partially Met    Target Date 12/02/21      PT LONG TERM GOAL #2   Title Pt will improve FOTO to >=52 to demo improved functional mobility    Baseline 43: 10/22/21    Time 6    Period Weeks    Status On-going    Target Date 12/02/21      PT LONG TERM GOAL #3   Title Pt will tolerate gait x 50' without AD with pain <= 3/10 to improve household mobility    Baseline can ambulate 50 ft without AD, but pain is 7-9/10    Time 6    Period Weeks    Status Partially Met    Target Date 12/02/21      PT LONG TERM GOAL #4   Title Pt will improve Rt ankle strength to 4/5 to tolerate gait and mobility    Time 6    Period Weeks    Status Partially Met    Target Date 12/02/21                   Plan - 12/03/21 1422     Clinical Impression Statement Pt tolerating longer periods of time in shoe. Pt demonstrated decreased Rt ankle strength from last assessment, however pt has been up and in her shoe for 5 hr already today.  FOTO score has improved; not at goal yet.  Pt continues to be sensitive to STM to Rt calf and lateral foot.  Pt is making gradual gains towards remaining LTGs.  Pt has had other medical issues that has affected her attendance in therapy. She will benefit from continued PT intervention to maximize functional mobility.    Rehab Potential Good    PT Frequency 2x / week    PT Duration 6 weeks    PT Treatment/Interventions Aquatic Therapy;Cryotherapy;Moist Heat;Iontophoresis 4mg /ml Dexamethasone;Electrical Stimulation;Gait training;Stair training;Functional mobility training;Neuromuscular re-education;Balance training;Therapeutic exercise;Therapeutic activities;Patient/family education;Manual techniques;Dry  needling;Taping;Vasopneumatic Device    PT Next Visit Plan gait and balance; LLE strengthening.    PT Home Exercise Plan [email or web address]     Consulted and Agree with Plan of Care Patient             Patient will benefit from skilled therapeutic intervention in order to improve the following deficits and impairments:  Pain, Decreased strength, Decreased activity tolerance, Decreased range of motion, Increased edema, Impaired sensation, Difficulty walking, Abnormal gait, Decreased balance, Decreased mobility  Visit Diagnosis: Pain in right ankle and joints of right foot  Other abnormalities of gait and mobility  Difficulty in walking, not elsewhere classified  Muscle weakness (generalized)     Problem List Patient Active Problem List   Diagnosis Date Noted   Hand cramps 11/26/2021   Left hand weakness 10/11/2021   Numbness and tingling in left hand 09/02/2021   Gastroesophageal reflux disease 07/28/2021   Intrinsic atopic dermatitis 12/01/2020   Dysfunction of both eustachian tubes 12/01/2020   Pruritic rash 02/12/2020   Allergic conjunctivitis of both eyes 02/12/2020   Seasonal allergic rhinitis with a nonallergic component 12/25/2019   Allergic conjunctivitis 12/25/2019   Moderate persistent asthma without complication 47/09/5270  Isabelle Course, PT,DPT02/10/233:43 PM  Kerin Perna, PTA 12/03/21 2:42 PM  [place], [place], [address] Phone: [phone]   Fax:  [phone]  Name: Avneet Ashmore MRN: [id number] Date of Birth: 1961/02/18

## 2021-12-04 ENCOUNTER — Encounter: Payer: Self-pay | Admitting: Gastroenterology

## 2021-12-04 LAB — URINE CULTURE
MICRO NUMBER:: 12986900
SPECIMEN QUALITY:: ADEQUATE

## 2021-12-06 ENCOUNTER — Encounter: Payer: Self-pay | Admitting: Dermatology

## 2021-12-06 NOTE — Progress Notes (Signed)
° °  Follow-Up Visit   Subjective  Laura Fields is a 61 y.o. female who presents for the following: Cyst (Cyst on back of neck x year -seems ato be getting bigger. ).  Enlarging nodule back of neck Location:  Duration:  Quality:  Associated Signs/Symptoms: Modifying Factors:  Severity:  Timing: Context:   Objective  Well appearing patient in no apparent distress; mood and affect are within normal limits. Neck - Posterior Noninflamed 2 cm deep dermal subcutaneous nodule compatible with epidermoid cyst.  Benign nature of lesion detail for patient along with risks of surgery (recurrence, unsightly scar, infection).  She does wish to schedule for surgical removal.    A focused examination was performed including head and neck. Relevant physical exam findings are noted in the Assessment and Plan.   Assessment & Plan    Cyst of skin Neck - Posterior  Pt needs 30 min f/u for cyst removal       I, Janalyn Harder, MD, have reviewed all documentation for this visit.  The documentation on 12/06/21 for the exam, diagnosis, procedures, and orders are all accurate and complete.

## 2021-12-07 ENCOUNTER — Ambulatory Visit (INDEPENDENT_AMBULATORY_CARE_PROVIDER_SITE_OTHER): Payer: 59 | Admitting: Podiatry

## 2021-12-07 ENCOUNTER — Encounter: Payer: Self-pay | Admitting: Podiatry

## 2021-12-07 ENCOUNTER — Other Ambulatory Visit: Payer: Self-pay

## 2021-12-07 DIAGNOSIS — Z9889 Other specified postprocedural states: Secondary | ICD-10-CM | POA: Diagnosis not present

## 2021-12-07 DIAGNOSIS — M7661 Achilles tendinitis, right leg: Secondary | ICD-10-CM | POA: Diagnosis not present

## 2021-12-07 NOTE — Progress Notes (Signed)
Please inform patient of the following:  Urine culture confirms UTI. The antibiotic we have her on should treat this. Would like for her to let us know if her symptoms are not improving.  Laura Fields. Jimmey Ralph, MD 12/07/2021 8:18 AM

## 2021-12-07 NOTE — Progress Notes (Signed)
She presents today date of surgery 07/02/2021 gastroc recession Achilles tenolysis heel spur resection and cast application.  She has had a really hard time of it and getting her pain under control and getting her strength back.  She has been taking physical therapy and aqua therapy which has helped her finally turned the corner and she is starting to make real progress with this right leg.  Objective: Vital signs are stable she alert and in x3 she has some mild peroneal tendinitis on palpation and abduction against resistance.  Otherwise her allodynic pain is going away and she credits that to the vitamin C increase but also she is getting stronger with the aqua therapy.  Assessment: Well-healing surgical foot and leg.  Plan: I think the best thing that we can do at this point is to continue aqua therapy and increasing her strength and balance training.

## 2021-12-08 ENCOUNTER — Ambulatory Visit: Payer: 59 | Admitting: Physical Therapy

## 2021-12-08 ENCOUNTER — Telehealth: Payer: Self-pay | Admitting: *Deleted

## 2021-12-08 ENCOUNTER — Encounter: Payer: Self-pay | Admitting: Physical Therapy

## 2021-12-08 DIAGNOSIS — M25571 Pain in right ankle and joints of right foot: Secondary | ICD-10-CM

## 2021-12-08 DIAGNOSIS — M6281 Muscle weakness (generalized): Secondary | ICD-10-CM

## 2021-12-08 DIAGNOSIS — R2689 Other abnormalities of gait and mobility: Secondary | ICD-10-CM

## 2021-12-08 DIAGNOSIS — R262 Difficulty in walking, not elsewhere classified: Secondary | ICD-10-CM

## 2021-12-08 NOTE — Telephone Encounter (Signed)
I did submit for Tezspire again for patient and her plan does not cover as benefit for her plan she is on. I will reach out to see if we can get her on fast start for now just to get her on some therapy

## 2021-12-08 NOTE — Telephone Encounter (Signed)
-----   Message from Hetty Blend, FNP sent at 12/02/2021  5:12 PM EST ----- Hi there. She does not have a perennial environmental allergy so is Xolair out of the running? Thank you ----- Message ----- From: Devoria Glassing, CMA Sent: 11/29/2021   3:37 PM EST To: Hetty Blend, FNP  Patient Ins will not cover Tezspire. I have tried with this new plan so the only other option she has at this time in Xolair for her asthma since no history of eos. Please advise. Cornelius Schuitema  ----- Message ----- From: Hetty Blend, FNP Sent: 11/25/2021   1:45 PM EST To: Devoria Glassing, CMA  Hi there. Any chance to submit this patient for Tezspire injections please. Not well controlled asthma with 3 steroid bursts this year. Absolute eos 0.0 2 times over the last year. IgE 36 with seasonal allergies only. Thank you

## 2021-12-08 NOTE — Patient Instructions (Signed)
Access Code: [email or web address] URL: [email or web address] Date: 12/08/2021 Prepared by: [place]  Exercises Seated Ankle Dorsiflexion AROM - 1 x daily - 7 x weekly - 3 sets - 10 reps Seated Heel Raise - 1 x daily - 7 x weekly - 3 sets - 10 reps Ankle Eversion with Resistance - 1 x daily - 7 x weekly - 2 sets - 10 reps Ankle Inversion with Resistance - 1 x daily - 7 x weekly - 2 sets - 10 reps Ankle Dorsiflexion with Resistance - 1 x daily - 7 x weekly - 2 sets - 10 reps Seated Soleus Stretch - 2 x daily - 7 x weekly - 5 reps - 10 seconds hold Seated Calf Stretch with Strap - 3 x daily - 7 x weekly - 1 sets - 2 reps - 30 seconds hold Staggered Sit-to-Stand - 1 x daily - 7 x weekly - 1 sets - 10 reps Seated Piriformis Stretch - 1 x daily - 7 x weekly - 3 sets - 10 reps Standing Anterior Tibialis Stretch - 2 x daily - 7 x weekly - 1 sets - 3 reps - 15-30 hold

## 2021-12-08 NOTE — Therapy (Signed)
[place], [place], [address] Phone: [phone]   Fax:  [phone]  Physical Therapy Treatment  Patient Details  Name: Laura Fields MRN: [id number] Date of Birth: May 02, 1961 Referring Provider (PT): Hyatt,Max   Encounter Date: 12/08/2021   PT End of Session - 12/08/21 1107     Visit Number 16    Number of Visits 27    Date for PT Re-Evaluation 01/14/22    PT Start Time 1101    PT Stop Time 1150    PT Time Calculation (min) 49 min             Past Medical History:  Diagnosis Date   Allergy    Arthritis    Asthma    Fibromyalgia    Intrinsic atopic dermatitis 12/01/2020   Osteoporosis    Raynaud's disease    Sciatica     Past Surgical History:  Procedure Laterality Date   ABDOMINAL HYSTERECTOMY     ACHILLES TENDON REPAIR Right 07/02/2021   AUGMENTATION MAMMAPLASTY Bilateral 1999   Saline/ under muscle   back nerve     block   COLONOSCOPY  09/23/2013   [facility]   colonoscopy with endo  11/30/2021   GASTRIC BYPASS     KNEE ARTHROSCOPY     mass removable     on thumb   STOMACH SURGERY      There were no vitals filed for this visit.   Subjective Assessment - 12/08/21 1108     Subjective Pt reports she had follow up with surgeon yesterday.  He is pleased with progress and would like her to continue therapy.  She is wearing shoe for 4 hrs/ day.    Patient Stated Goals be able to walk and wt bear Rt foot/ankle    Currently in Pain? Yes    Pain Score 7    "it's not too bad"   Pain Location Ankle    Pain Orientation Right    Pain Descriptors / Indicators Aching    Pain Radiating Towards into Rt calf    Aggravating Factors  up on feet too long    Pain Relieving Factors ice, rest                [facility] PT Assessment - 12/08/21 0001       Assessment   Medical Diagnosis Achilles repair    Referring Provider (PT) Hyatt,Max    Onset Date/Surgical Date 07/02/21     Next MD Visit 01/04/22      Restrictions   RLE Weight Bearing Weight bearing as tolerated               [facility] Adult PT Treatment/Exercise - 12/08/21 0001       Ambulation/Gait   Ambulation/Gait Yes    Ambulation/Gait Assistance 5: Supervision    Ambulation Distance (Feet) 50 Feet    Assistive device Rolling walker    Gait Pattern Step-through pattern;Decreased stance time - right;Decreased dorsiflexion - right;Decreased dorsiflexion - left;Antalgic    Ambulation Surface Level;Indoor    Gait velocity slowed    Pre-Gait Activities at counter (with single hand to off load and steady) - worked on forward, backward gait - with decreased step length to focus on rolling through foot and even weight shift.    Gait Comments cues for heel strike and roll through, for even weight shift.      Knee/Hip Exercises: Stretches   Piriformis Stretch Right;2 reps;Left;1 rep;20 seconds  seated, knee to opp shoulder     Knee/Hip Exercises: Standing   Hip Abduction Right;Left;1 set;10 reps   hands on counter     Knee/Hip Exercises: Seated   Sit to Sand 1 set;10 reps;without UE support   standard seat, feet even.  1 rep with Lt foot on yoga block.     Kinesiotix   Create Space I strip of reg Rock tape applied to Rt distal calf to calcaneus.  Perpendicular strips placed at calcaneus and over incision in midcalf (50% stretch), I strip along Rt extensor digitorum longus -  to decompress tissue and increase proprioception.      Ankle Exercises: Stretches   Other Stretch ant tib stretch in seated position x 2 reps x 15 sec (leg off side of chair, knee flexed, foot PF with toes resting on ground)      Ankle Exercises: Seated   Heel Raises Both;20 reps    Heel Slides Right;10 reps   with holds in DF for stretch   Other Seated Ankle Exercises resisted Rt PF with black band x 20, with foot inversion / eversion (simulating moving between brake and gas pedal)      Ankle Exercises: Aerobic   Nustep L5 x  5 min for warm up, (SPM 45-60)                        PT Long Term Goals - 12/03/21 1332       PT LONG TERM GOAL #1   Title Pt will be independent with HEP    Time 6    Period Weeks    Status Partially Met    Target Date 01/14/22      PT LONG TERM GOAL #2   Title Pt will improve FOTO to >=52 to demo improved functional mobility    Baseline 43: 10/22/21    Time 6    Period Weeks    Status On-going    Target Date 01/14/22      PT LONG TERM GOAL #3   Title Pt will tolerate gait x 50' without AD with pain <= 3/10 to improve household mobility    Baseline can ambulate 50 ft without AD, but pain is 7-9/10    Time 6    Period Weeks    Status Partially Met    Target Date 01/14/22      PT LONG TERM GOAL #4   Title Pt will improve Rt ankle strength to 4/5 to tolerate gait and mobility    Time 6    Period Weeks    Status Partially Met    Target Date 01/14/22                Plan - 12/08/21 1338     Clinical Impression Statement Session focused on ankle ROM and quality of gait.  Pt reported decreased tightness and greater ease of weight shift/ rolling through Rt foot after seated exercises.  Pain level remained elevated throughout.  Pt making gradual progress towards remaining LTGs.    Rehab Potential Good    PT Frequency 2x / week    PT Duration 6 weeks    PT Treatment/Interventions Aquatic Therapy;Cryotherapy;Moist Heat;Iontophoresis 22m/ml Dexamethasone;Electrical Stimulation;Gait training;Stair training;Functional mobility training;Neuromuscular re-education;Balance training;Therapeutic exercise;Therapeutic activities;Patient/family education;Manual techniques;Dry needling;Taping;Vasopneumatic Device    PT Next Visit Plan gait and balance; LLE strengthening. Update and issue HEP.   PT Home Exercise Plan [email or web address]    Consulted and Agree with Plan of Care  Patient             Patient will benefit from skilled therapeutic intervention in order to improve  the following deficits and impairments:  Pain, Decreased strength, Decreased activity tolerance, Decreased range of motion, Increased edema, Impaired sensation, Difficulty walking, Abnormal gait, Decreased balance, Decreased mobility  Visit Diagnosis: Pain in right ankle and joints of right foot  Other abnormalities of gait and mobility  Difficulty in walking, not elsewhere classified  Muscle weakness (generalized)     Problem List Patient Active Problem List   Diagnosis Date Noted   Hand cramps 11/26/2021   Left hand weakness 10/11/2021   Numbness and tingling in left hand 09/02/2021   Gastroesophageal reflux disease 07/28/2021   Intrinsic atopic dermatitis 12/01/2020   Dysfunction of both eustachian tubes 12/01/2020   Pruritic rash 02/12/2020   Allergic conjunctivitis of both eyes 02/12/2020   Seasonal allergic rhinitis with a nonallergic component 12/25/2019   Allergic conjunctivitis 12/25/2019   Moderate persistent asthma without complication 19/47/1252   Kerin Perna, PTA 12/08/21 1:44 PM  [place], [place], [address] Phone: [phone]   Fax:  [phone]  Name: Oumou Smead MRN: [id number] Date of Birth: 23-Jun-1961

## 2021-12-09 ENCOUNTER — Other Ambulatory Visit: Payer: Self-pay

## 2021-12-09 ENCOUNTER — Encounter: Payer: Self-pay | Admitting: Rehabilitative and Restorative Service Providers"

## 2021-12-09 ENCOUNTER — Ambulatory Visit: Payer: 59 | Admitting: Rehabilitative and Restorative Service Providers"

## 2021-12-09 DIAGNOSIS — M25642 Stiffness of left hand, not elsewhere classified: Secondary | ICD-10-CM

## 2021-12-09 DIAGNOSIS — M25532 Pain in left wrist: Secondary | ICD-10-CM

## 2021-12-09 NOTE — Telephone Encounter (Signed)
L/m for patient to reach out to me to discuss trying to get her on Tezspire on fast start since it is not covered on her Ins plan

## 2021-12-09 NOTE — Therapy (Signed)
OUTPATIENT OCCUPATIONAL THERAPY ORTHO EVALUATION  Patient Name: Laura Fields MRN: [id number] DOB:02/12/1961, 61 y.o., female, female Today's Date: 12/09/2021  PCP: Olive BassMurray, Laura Woodruff, FNP REFERRING PROVIDER: Eldred MangesYates, Mark C, MD   OT End of Session - 12/09/21 [phone]     Visit Number 1    Number of Visits 8    Date for OT Re-Evaluation 01/07/22    OT Start Time 0955    OT Stop Time 1058    OT Time Calculation (min) 63 min    Activity Tolerance Patient tolerated treatment well;Patient limited by pain    [facility] tasks assessed/performed             Past Medical History:  Diagnosis Date   Allergy    Anxiety    Arthritis    Asthma    Depression    Fibromyalgia    Intrinsic atopic dermatitis 12/01/2020   Osteoporosis    Raynaud's disease    Sciatica    Past Surgical History:  Procedure Laterality Date   ABDOMINAL HYSTERECTOMY     ACHILLES TENDON REPAIR Right 07/02/2021   AUGMENTATION MAMMAPLASTY Bilateral 1999   Saline/ under muscle   back nerve     block   [facility]   colonoscopy with endo  11/30/2021   GASTRIC BYPASS     KNEE ARTHROSCOPY     mass removable     on thumb   STOMACH SURGERY     Patient Active Problem List   Diagnosis Date Noted   Hand cramps 11/26/2021   Left hand weakness 10/11/2021   Numbness and tingling in left hand 09/02/2021   Gastroesophageal reflux disease 07/28/2021   Intrinsic atopic dermatitis 12/01/2020   Dysfunction of both eustachian tubes 12/01/2020   Pruritic rash 02/12/2020   Allergic conjunctivitis of both eyes 02/12/2020   Seasonal allergic rhinitis with a nonallergic component 12/25/2019   Allergic conjunctivitis 12/25/2019   Moderate persistent asthma without complication 12/25/2019    ONSET DATE: Chronic issues since 1996, per pt report  REFERRING DIAG:  R25.2 (ICD-10-CM) - Hand cramps  R29.898 (ICD-10-CM) - Left hand weakness  R20.0,R20.2 (ICD-10-CM) - Numbness and  tingling in left hand  M79.642 (ICD-10-CM) - Pain in left hand    THERAPY DIAG:  Stiffness of left hand, not elsewhere classified  Pain in left wrist  SUBJECTIVE:   SUBJECTIVE STATEMENT: Pt shows me video of her Lt wrist with apparently palmaris longus standing out and fingers outstretched, stating her fingers "lock up." similar to what she did at MD office. This video was from Dec 9th, but she states it happened 3 days ago and she dropped a glass. Her Lt hand will also go numb in digits 2, 3 only. Also states having Raynaud's in tips of all fingers, toes.These issues are all on Lt side. She attends pain management clinic. She states taking no pain meds or antiinflammatories bc she can't tolerate them due to IBS. She states EMG for CTS was negative. She states she is applying for disability as she hasn't been able to tolerate work. She states tying to relax, traveling, visiting loved ones overseas, etc.   PERTINENT HISTORY: Currently PT for Rt ankle pain;  Per hand surgeon evaluation on 11/26/21: "61 year old right-hand-dominant female who presents for follow-up of left hand weakness.  She describes pain in her distal forearm and hand that is intermittent and is associated with "locking up" of her index and middle fingers.  This happens randomly  when she is doing activity such as cooking cleaning or otherwise using her hands.  There are no certain activities that bring this on.Marland KitchenMarland KitchenHer exam today is totally normal.  We discussed the nature of hand cramps and preventive strategies such as staying hydrated and stretching the hand intermittently during activities." PmHx significant for fibromyalgia, OA, osteoporosis, low back pain, past spinal ablation, muscle spasms, and other serious issues.   PAIN:  Are you having pain? Yes NPRS scale: 5/10 Pain location: volar proximal to distal wrist crease  Pain orientation: Left  PAIN TYPE: sharp Pain description: intermittent, typically worse at night   Aggravating factors: "doing something in the kitchen, it could be anything" Relieving factors: pt attempts to use topicals and rest  PRECAUTIONS: None  HAND DOMINANCE: Right  WEIGHT BEARING RESTRICTIONS No  FALLS: Has patient fallen in last 6 months? Yes, Number of falls: She states multiple falls, is being treated by PT for LE issues/balance, etc.   PLOF: Independent  PATIENT GOALS Have less pain in Lt wrist, stop dropping objects, etc.   OBJECTIVE:   DIAGNOSTIC FINDINGS: Imaging from Oct '22: "Imaging: 3 views of bilateral wrist taken today reviewed interpreted by me.  They demonstrate no acute bony injury.  There is no evidence of instability or dislocation.  There is no evidence of significant degenerative changes."  COGNITION: Overall cognitive status: Within functional limits for tasks assessed  ADLs: Overall ADLs: Pt states having significant difficulties with using containers, holding/grasping objects, using silverware, sleep numbness and disturbance, etc.   FUNCTIONAL OUTCOME MEASURES: Quick Dash 45% today impairment    SENSATION: Light touch: Impaired: decreased in ulnar & median nerve dist as seen by [facility]  Stereognosis: Appears intact Hot/Cold: Appears intact Proprioception: Appears intact Semmes Weinstein Monofilament scale:Lt hand:  Diminished protective (3.84 - 4.31) @ thumb; Diminished light touch (3.61) @ IF & SF; "normal" (2.83) radial side of ring finger.   These results are fairly contradictory in median nerve reports.   COORDINATION: 9 Hole Peg test: Right:  sec; Left:  sec Box and Blocks: Comments: Will be assessed as needed, but has full fist and full opposition despite long (1") fingernails today   PERCEPTION: WFL  PRAXIS: WFL  EDEMA: Pt has some "bulging" at over Lt flexor retinaculum with wrist ext, but circumferential measures are equal Lt to Rt wrists (15.5cm)   MUSCLE TONE: WNL, no twitching or cramping noted today   SKIN INTEGRITY: no  issues or breakdown   PALPATION: Pt reports TTP over flexor retinaculum, but no soreness at thumb CMC, MPJs, etc.   UE AROM/PROM: typical/normal from forearms and proximal b/l  A/PROM Right 12/09/2021 Left 12/09/2021  Wrist flexion 45 26 (states pain)  Wrist extension 75 70  Wrist ulnar deviation  46  Wrist radial deviation  20  (Blank rows = not tested)  HAND A/PROM: Can make full fist b/l and fully oppose b/l, no pain or c/o    UE MMT:  MMT Left 12/09/2021  Shoulder flexion   Shoulder abduction   Shoulder adduction   Shoulder extension   Shoulder internal rotation   Shoulder external rotation   Middle trapezius   Lower trapezius   Elbow flexion   Elbow extension   Wrist flexion 4-/5 reports no pain  Wrist extension 4-/5 reports pain  Wrist ulnar deviation 4+/5  Wrist radial deviation 4+/5   (Blank rows = not tested)  HAND FUNCTION:  Grip strength: Right: 37 lbs; Left: 13 lbs (states pain)   Comments:  strangely very weak b/l (could be linked to tight flexion b/l), try 5-position grip testing to check for full effort in upcoming sessions   TODAY'S TREATMENT:  12/09/21: OT edu pt for nervous symptom management pain relief in terms of staying calm, trying to reduce anxiety (linked to Raynaud's and fibro, etc.). Also edu for energy conservation as she states doing high periods of activity at once (like meal prep for week, etc.). She states understanding and doing some modifications already. This may require f/u education.  OT also educates on first HEP stretches to improve wrist flexion with coaching to relax when performing. Also edu to monitor for guarding and attempt to relax to avoid spasms and tendonitis, etc.    PATIENT EDUCATION: Education details: See tx section above for details Person educated: Patient Education method: Explanation, Demonstration, and Handouts Education comprehension: verbalized understanding, returned demonstration, and needs further  education   HOME EXERCISE PROGRAM: Wrist flexion stretches and more TBD   ASSESSMENT:  CLINICAL IMPRESSION: Patient is a 61 y.o. female who was seen today for occupational therapy evaluation and treatment for Lt hand/wrist pain. She also c/o numbness in median nerve distribution worse at night, for which she may benefit from orthotic bracing (though sensation tests were not conclusive today). OT worried that her numerous, chronic issues are systemic in nature- related to nervous system, however OT will attempt to manage UE complaints with conservative management to address tightness and weakness, possible poor habits and overuse.   PERFORMANCE DEFICITS in functional skills including ADLs, IADLs, sensation, edema, ROM, strength, pain, fascial restrictions, muscle spasms, flexibility, endurance, and UE functional use, cognitive skills including emotional and safety awareness, and psychosocial skills including coping strategies, environmental adaptation, habits, and routines and behaviors.   These impairments are limiting patient from ADLs, IADLs, rest and sleep, work, and social participation.   COMORBIDITIES has co-morbidities such as fibromyalgia, OA, osteoporosis, low back pain, past spinal ablation, muscle spasms, depression, Raynaud's, and complications chronically since 1996   that affects occupational performance. Patient will benefit from skilled OT to address above impairments and improve overall function.  MODIFICATION OR ASSISTANCE TO COMPLETE EVALUATION: Min-Moderate modification of tasks or assist with assess necessary to complete an evaluation.  OT OCCUPATIONAL PROFILE AND HISTORY: Comprehensive assessment: Review of records and extensive additional review of physical, cognitive, psychosocial history related to current functional performance.  CLINICAL DECISION MAKING: Moderate - several treatment options, min-mod task modification necessary  REHAB POTENTIAL: Fair due to chronicity  and severity of c/o and numerous comorbidities  EVALUATION COMPLEXITY: Moderate     GOALS: Goals reviewed with patient? No  SHORT TERM GOALS: (STG required if POC>30 days)  LONG TERM GOALS:   LTG Name Target Date Goal status  1 Pt will decrease impairment per Quick DASH from 45% to 20% or better for increased quality of life 01/07/22 INITIAL  2 Pt will increase Lt wrist flexion A/ROM from 26* to at least 45* for better flexibility to grasp objects  01/07/22 INITIAL  3 Pt will increase Lt grip strength from 13# painfully to at least 25# for BADL home use  01/07/22 INITIAL  4 Pt will demo or state at least 5 coping mechanisms to deal with chronic pain, tightness, numbness, etc. (Examples: better postures/sleep postures, stretches, light isometric strength, activity modifications, energy conservation, built up handles and A/E, taking appropriate meds, decrease anxiety, etc.)  01/07/22 INITIAL   PLAN: OT FREQUENCY: 2x/week  OT DURATION: 4 weeks  PLANNED INTERVENTIONS: self care/ADL training, therapeutic  exercise, therapeutic activity, neuromuscular re-education, manual therapy, passive range of motion, splinting, electrical stimulation, ultrasound, fluidotherapy, compression bandaging, moist heat, cryotherapy, contrast bath, patient/family education, psychosocial skills training, energy conservation, coping strategies training, and DME and/or AE instructions  PLAN FOR NEXT SESSION: Review HEP and recommendations, consider fnl orthotic or pre-fab for wrist support in day and to help night numbness, add HEP as tolerated and consider light fnl activities/further assessments   RECOMMENDED OTHER SERVICES: She is already followed by pain mgmt clinic, she may benefit from mental health counseling due to stress and being on meds for anxiety, etc. Also would benefit from comprehensive med review with clinical pharmacist or holistic MD.   CONSULTED AND AGREED WITH PLAN OF CARE: Patient   Fannie Knee, OTR/L, CHT 12/09/2021, 12:31 PM

## 2021-12-10 ENCOUNTER — Ambulatory Visit: Payer: 59 | Admitting: Physical Therapy

## 2021-12-10 ENCOUNTER — Encounter: Payer: Self-pay | Admitting: Physical Therapy

## 2021-12-10 ENCOUNTER — Encounter: Payer: Self-pay | Admitting: Physical Medicine and Rehabilitation

## 2021-12-10 DIAGNOSIS — R262 Difficulty in walking, not elsewhere classified: Secondary | ICD-10-CM

## 2021-12-10 DIAGNOSIS — M25571 Pain in right ankle and joints of right foot: Secondary | ICD-10-CM

## 2021-12-10 DIAGNOSIS — R2689 Other abnormalities of gait and mobility: Secondary | ICD-10-CM

## 2021-12-10 DIAGNOSIS — M6281 Muscle weakness (generalized): Secondary | ICD-10-CM

## 2021-12-10 NOTE — Therapy (Signed)
[place] [place], [place], [address] Phone: [phone]   Fax:  [phone]  Physical Therapy Treatment  Patient Details  Name: Laura Fields MRN: [id number] Date of Birth: 12-16-1960 Referring Provider (PT): Hyatt,Max   Encounter Date: 12/10/2021   PT End of Session - 12/10/21 0939     Visit Number 17    Number of Visits 27    Date for PT Re-Evaluation 01/14/22    PT Start Time 0934    PT Stop Time 1013    PT Time Calculation (min) 39 min    Behavior During Therapy [facility] for tasks assessed/performed             Past Medical History:  Diagnosis Date   Allergy    Anxiety    Arthritis    Asthma    Depression    Fibromyalgia    Intrinsic atopic dermatitis 12/01/2020   Osteoporosis    Raynaud's disease    Sciatica     Past Surgical History:  Procedure Laterality Date   ABDOMINAL HYSTERECTOMY     ACHILLES TENDON REPAIR Right 07/02/2021   AUGMENTATION MAMMAPLASTY Bilateral 1999   Saline/ under muscle   back nerve     block   COLONOSCOPY  09/23/2013   [facility]   colonoscopy with endo  11/30/2021   GASTRIC BYPASS     KNEE ARTHROSCOPY     mass removable     on thumb   STOMACH SURGERY      There were no vitals filed for this visit.   Subjective Assessment - 12/10/21 0940     Subjective Pt reports her Rt ankle swelled last night and she has had increased pain since then.  She states she wasn't able to get into one of her tennis shoes due to swelling.    Patient Stated Goals be able to walk and wt bear Rt foot/ankle    Currently in Pain? Yes    Pain Score 10-Worst pain ever    Pain Location Ankle    Pain Orientation Right    Pain Descriptors / Indicators Aching;Sore;Sharp                [facility] PT Assessment - 12/10/21 0001       Assessment   Medical Diagnosis Achilles repair    Referring Provider (PT) Hyatt,Max    Onset Date/Surgical Date 07/02/21    Next MD Visit  01/04/22      Strength   Right Ankle Inversion 4/5              [facility] Adult PT Treatment/Exercise - 12/10/21 0001       Ambulation/Gait   Ambulation/Gait Assistance 6: Modified independent (Device/Increase time)    Ambulation Distance (Feet) 80 Feet   X 2,  and 40 ft    Assistive device Straight cane    Gait Pattern Step-through pattern;Decreased stance time - right;Decreased dorsiflexion - right;Decreased dorsiflexion - left;Antalgic    Ambulation Surface Level;Indoor    Gait velocity slowed    Gait Comments cues for proper sequence with cane, even stance time and length.      Self-Care   Self-Care Other Self-Care Comments    Other Self-Care Comments  reviewed self massage with roller stick to RLE; pt returned demo with cues.      Knee/Hip Exercises: Stretches   Piriformis Stretch Right;Left;20 seconds;2 reps   seated, knee to opp shoulder     Knee/Hip Exercises: Aerobic  Recumbent Bike unable to tolerate position    Nustep L4 x 5 min for warm up without boot on  (70 SPM)     Knee/Hip Exercises: Seated   Sit to Sand 5 reps;without UE support;2 sets      [employer]ines[employer]   Create Space I strip of reg Rock taped perpendicular to I strip already in place on Rt calf.      Ankle Exercises: Stretches   Gastroc Stretch 2 reps;20 seconds   seated with strap   Other Stretch ant tib stretch in seated position x 2 reps x 20 sec (leg off side of chair, knee flexed, foot PF with toes resting on ground)      Ankle Exercises: Seated   Heel Slides Right;10 reps   with holds in DF for stretch   Other Seated Ankle Exercises PF/DF on rocker board x 20; resisted R eversion  with red band x 10, 2 sets;                          PT Long Term Goals - 12/03/21 1332       PT LONG TERM GOAL #1   Title Pt will be independent with HEP    Time 6    Period Weeks    Status Partially Met    Target Date 01/14/22      PT LONG TERM GOAL #2   Title Pt will improve FOTO to >=52 to  demo improved functional mobility    Baseline 43: 10/22/21    Time 6    Period Weeks    Status On-going    Target Date 01/14/22      PT LONG TERM GOAL #3   Title Pt will tolerate gait x 50' without AD with pain <= 3/10 to improve household mobility    Baseline can ambulate 50 ft without AD, but pain is 7-9/10    Time 6    Period Weeks    Status Partially Met    Target Date 01/14/22      PT LONG TERM GOAL #4   Title Pt will improve Rt ankle strength to 4/5 to tolerate gait and mobility    Time 6    Period Weeks    Status Partially Met    Target Date 01/14/22                   Plan - 12/10/21 1007     Clinical Impression Statement Pt arrived with elevated pain level in Rt ankle.  She had limited tolerance for exercises due to frequent spasms in Rt calf.  Gait quality improving with use of assistive device; encouraged pt to utilize cane at home and in community while she is increasing time in shoe (instead of boot).    Rehab Potential Good    PT Frequency 2x / week    PT Duration 6 weeks    PT Treatment/Interventions Aquatic Therapy;Cryotherapy;Moist Heat;Iontophoresis 37m/ml Dexamethasone;Electrical Stimulation;Gait training;Stair training;Functional mobility training;Neuromuscular re-education;Balance training;Therapeutic exercise;Therapeutic activities;Patient/family education;Manual techniques;Dry needling;Taping;Vasopneumatic Device    PT Next Visit Plan gait and balance; LLE strengthening.    PT Home Exercise Plan [id number]    Consulted and Agree with Plan of Care Patient             Patient will benefit from skilled therapeutic intervention in order to improve the following deficits and impairments:  Pain, Decreased strength, Decreased activity tolerance, Decreased range of motion, Increased edema, Impaired sensation, Difficulty  walking, Abnormal gait, Decreased balance, Decreased mobility  Visit Diagnosis: Pain in right ankle and joints of right foot  Other  abnormalities of gait and mobility  Difficulty in walking, not elsewhere classified  Muscle weakness (generalized)     Problem List Patient Active Problem List   Diagnosis Date Noted   Hand cramps 11/26/2021   Left hand weakness 10/11/2021   Numbness and tingling in left hand 09/02/2021   Gastroesophageal reflux disease 07/28/2021   Intrinsic atopic dermatitis 12/01/2020   Dysfunction of both eustachian tubes 12/01/2020   Pruritic rash 02/12/2020   Allergic conjunctivitis of both eyes 02/12/2020   Seasonal allergic rhinitis with a nonallergic component 12/25/2019   Allergic conjunctivitis 12/25/2019   Moderate persistent asthma without complication 90/30/0923   Kerin Perna, PTA 12/10/21 1:58 PM   [facility] [place], [place], [address] Phone: [phone]   Fax:  [phone]  Name: Laura Fields MRN: [id number] Date of Birth: 1961/01/29

## 2021-12-13 ENCOUNTER — Other Ambulatory Visit: Payer: Self-pay

## 2021-12-13 ENCOUNTER — Ambulatory Visit (INDEPENDENT_AMBULATORY_CARE_PROVIDER_SITE_OTHER): Payer: 59 | Admitting: Orthopedic Surgery

## 2021-12-13 DIAGNOSIS — R29898 Other symptoms and signs involving the musculoskeletal system: Secondary | ICD-10-CM

## 2021-12-13 DIAGNOSIS — R200 Anesthesia of skin: Secondary | ICD-10-CM | POA: Diagnosis not present

## 2021-12-13 DIAGNOSIS — R252 Cramp and spasm: Secondary | ICD-10-CM | POA: Diagnosis not present

## 2021-12-13 DIAGNOSIS — R202 Paresthesia of skin: Secondary | ICD-10-CM | POA: Diagnosis not present

## 2021-12-13 NOTE — Progress Notes (Signed)
Office Visit Note   Patient: Laura Fields           Date of Birth: [birth year]           MRN: [id number] Visit Date: 12/13/2021              Requested by: Olive Bass, FNP [id number] [place],  [place] [address] PCP: Olive Bass, FNP   Assessment & Plan: Visit Diagnoses:  1. Hand cramps   2. Left hand weakness   3. Numbness and tingling in left hand     Plan: Patient describes continued symptoms in her left hand.  She is been 1 hand therapy session so far and is happy with that 1 visit.  She is optimistic that continued therapy will be helpful for her.  I can see her back after her therapy is completed to see how she is doing.  Follow-Up Instructions: No follow-ups on file.   Orders:  No orders of the defined types were placed in this encounter.  No orders of the defined types were placed in this encounter.     Procedures: No procedures performed   Clinical Data: No additional findings.   Subjective: Chief Complaint  Patient presents with   Left Hand - Follow-up    Still aches and cramps    This is a 61 year old right-hand-dominant female who presents for follow-up of left hand symptoms.  She has previously described cramping type pain and locking of her index middle fingers that occurs randomly and intermittently.  She has occasional numbness and tingling in the index and middle finger but none today.  Previous electrodiagnostic studies were negative.  She is in hand therapy to work on left hand strengthening, Cram prevention, and overall improved left hand function.  She is happy with her first visit so far   Review of Systems   Objective: Vital Signs: There were no vitals taken for this visit.  Physical Exam  Left Hand Exam   Tenderness  The patient is experiencing no tenderness.   Range of Motion  The patient has normal left wrist ROM.  Muscle Strength  The patient has normal left wrist strength.  Other   Erythema: absent Sensation: normal Pulse: present     Specialty Comments:  No specialty comments available.  Imaging: No results found.   PMFS History: Patient Active Problem List   Diagnosis Date Noted   Hand cramps 11/26/2021   Left hand weakness 10/11/2021   Numbness and tingling in left hand 09/02/2021   Gastroesophageal reflux disease 07/28/2021   Intrinsic atopic dermatitis 12/01/2020   Dysfunction of both eustachian tubes 12/01/2020   Pruritic rash 02/12/2020   Allergic conjunctivitis of both eyes 02/12/2020   Seasonal allergic rhinitis with a nonallergic component 12/25/2019   Allergic conjunctivitis 12/25/2019   Moderate persistent asthma without complication 12/25/2019   Past Medical History:  Diagnosis Date   Allergy    Anxiety    Arthritis    Asthma    Depression    Fibromyalgia    Intrinsic atopic dermatitis 12/01/2020   Osteoporosis    Raynaud's disease    Sciatica     Family History  Problem Relation Age of Onset   Hypertension Mother    Stomach cancer Other    Rectal cancer Other    Esophageal cancer Other    Colon polyps Other    Colon cancer Other    Allergic rhinitis Neg Hx    Angioedema Neg  Hx    Asthma Neg Hx    Eczema Neg Hx    Immunodeficiency Neg Hx    Urticaria Neg Hx     Past Surgical History:  Procedure Laterality Date   ABDOMINAL HYSTERECTOMY     ACHILLES TENDON REPAIR Right 07/02/2021   AUGMENTATION MAMMAPLASTY Bilateral 1999   Saline/ under muscle   back nerve     block   COLONOSCOPY  09/23/2013   [facility]   colonoscopy with endo  11/30/2021   GASTRIC BYPASS     KNEE ARTHROSCOPY     mass removable     on thumb   STOMACH SURGERY     Social History   Occupational History   Not on file  Tobacco Use   Smoking status: Never    Passive exposure: Yes   Smokeless tobacco: Never  Vaping Use   Vaping Use: Never used  Substance and Sexual Activity   Alcohol use: Yes    Comment: monthly or less   Drug use:  Never   Sexual activity: Yes

## 2021-12-14 NOTE — Telephone Encounter (Signed)
Spoke to patient and advised no Ins coverage for [employer] and will initiate fast start program for her and she will go by and sign consent form and I will reach out to get her start once she is shipped

## 2021-12-15 ENCOUNTER — Other Ambulatory Visit: Payer: Self-pay

## 2021-12-15 ENCOUNTER — Ambulatory Visit: Payer: 59 | Admitting: Physical Therapy

## 2021-12-15 DIAGNOSIS — M6281 Muscle weakness (generalized): Secondary | ICD-10-CM

## 2021-12-15 DIAGNOSIS — M25571 Pain in right ankle and joints of right foot: Secondary | ICD-10-CM | POA: Diagnosis not present

## 2021-12-15 DIAGNOSIS — R262 Difficulty in walking, not elsewhere classified: Secondary | ICD-10-CM

## 2021-12-15 DIAGNOSIS — R2689 Other abnormalities of gait and mobility: Secondary | ICD-10-CM

## 2021-12-15 NOTE — Therapy (Signed)
[facility] [address] [place], [place], [address] Phone: [phone]   Fax:  [phone]  Physical Therapy Treatment  Patient Details  Name: Laura Fields MRN: [id number] Date of Birth: 1960/11/19 Referring Provider (PT): Hyatt,Max   Encounter Date: 12/15/2021   PT End of Session - 12/15/21 1145     Visit Number 18    Number of Visits 27    Date for PT Re-Evaluation 01/14/22    PT Start Time 1100    PT Stop Time 1145    PT Time Calculation (min) 45 min    Activity Tolerance Patient tolerated treatment well    Behavior During Therapy [facility] for tasks assessed/performed             Past Medical History:  Diagnosis Date   Allergy    Anxiety    Arthritis    Asthma    Depression    Fibromyalgia    Intrinsic atopic dermatitis 12/01/2020   Osteoporosis    Raynaud's disease    Sciatica     Past Surgical History:  Procedure Laterality Date   ABDOMINAL HYSTERECTOMY     ACHILLES TENDON REPAIR Right 07/02/2021   AUGMENTATION MAMMAPLASTY Bilateral 1999   Saline/ under muscle   back nerve     block   COLONOSCOPY  09/23/2013   [facility]   colonoscopy with endo  11/30/2021   GASTRIC BYPASS     KNEE ARTHROSCOPY     mass removable     on thumb   STOMACH SURGERY      There were no vitals filed for this visit.   Subjective Assessment - 12/15/21 1059     Subjective Pt states she continues to have spasms and swelling limiting her progress. She is unable to wear her normal shoe today but is wearing a more flexible flat shoe    Patient Stated Goals be able to walk and wt bear Rt foot/ankle    Currently in Pain? Yes    Pain Score 8     Pain Location Ankle    Pain Orientation Right    Pain Descriptors / Indicators Aching;Sore    Pain Type Chronic pain                [facility] PT Assessment - 12/15/21 0001       Assessment   Medical Diagnosis Achilles repair    Referring Provider (PT) Hyatt,Max     Onset Date/Surgical Date 07/02/21    Next MD Visit 01/04/22      Strength   Right Ankle Eversion 4/5                           [facility] Adult PT Treatment/Exercise - 12/15/21 0001       Ambulation/Gait   Gait Comments gait without AD with focus on heel toe and step through pattern      Knee/Hip Exercises: Stretches   Hip Flexor Stretch 2 reps;30 seconds    Piriformis Stretch Right;Left;20 seconds;2 reps   seated, knee to opp shoulder     Knee/Hip Exercises: Aerobic   Nustep L5 x 5 min for warm up without boot on      Knee/Hip Exercises: Standing   Lateral Step Up 2 sets;Right;10 reps;Step Height: 6"    Forward Step Up Right;2 sets;10 reps;Step Height: 6"    Step Down Limitations unable due to pain      Ankle Exercises: Seated  Towel Crunch 3 reps   3 x 1 min with focus on toe mobility   Heel Slides Right;10 reps   with holds in DF for stretch   Other Seated Ankle Exercises PF/DF on rockerboard x 2 min, resisted eversion red TB 2 x 10      Ankle Exercises: Stretches   Gastroc Stretch 2 reps;20 seconds   seated with strap   Other Stretch ant tib stretch in seated position x 2 reps x 20 sec (leg off side of chair, knee flexed, foot PF with toes resting on ground)                          PT Long Term Goals - 12/03/21 1332       PT LONG TERM GOAL #1   Title Pt will be independent with HEP    Time 6    Period Weeks    Status Partially Met    Target Date 01/14/22      PT LONG TERM GOAL #2   Title Pt will improve FOTO to >=52 to demo improved functional mobility    Baseline 43: 10/22/21    Time 6    Period Weeks    Status On-going    Target Date 01/14/22      PT LONG TERM GOAL #3   Title Pt will tolerate gait x 50' without AD with pain <= 3/10 to improve household mobility    Baseline can ambulate 50 ft without AD, but pain is 7-9/10    Time 6    Period Weeks    Status Partially Met    Target Date 01/14/22      PT LONG TERM GOAL #4    Title Pt will improve Rt ankle strength to 4/5 to tolerate gait and mobility    Time 6    Period Weeks    Status Partially Met    Target Date 01/14/22                   Plan - 12/15/21 1146     Clinical Impression Statement Pt with improved standing and gait tolerance today. Focused on improving foot and toe mobility as well as activity tolerance. Pt with good tolerance to standing and gait training with focus on heel toe gait today    PT Next Visit Plan gait and balance; LLE strengthening.    PT Home Exercise Plan [email or web address]             Patient will benefit from skilled therapeutic intervention in order to improve the following deficits and impairments:     Visit Diagnosis: Pain in right ankle and joints of right foot  Other abnormalities of gait and mobility  Difficulty in walking, not elsewhere classified  Muscle weakness (generalized)     Problem List Patient Active Problem List   Diagnosis Date Noted   Hand cramps 11/26/2021   Left hand weakness 10/11/2021   Numbness and tingling in left hand 09/02/2021   Gastroesophageal reflux disease 07/28/2021   Intrinsic atopic dermatitis 12/01/2020   Dysfunction of both eustachian tubes 12/01/2020   Pruritic rash 02/12/2020   Allergic conjunctivitis of both eyes 02/12/2020   Seasonal allergic rhinitis with a nonallergic component 12/25/2019   Allergic conjunctivitis 12/25/2019   Moderate persistent asthma without complication 12/25/2019    Saphronia Ozdemir, PT 12/15/2021, 11:47 AM  [facility] [address] [place], [place], [address] Phone: [phone]  Fax:  [phone]  Name: Laura Fields MRN: [id number] Date of Birth: 1960/12/14

## 2021-12-16 ENCOUNTER — Encounter: Payer: Self-pay | Admitting: Plastic Surgery

## 2021-12-16 ENCOUNTER — Ambulatory Visit: Payer: 59 | Admitting: Rehabilitative and Restorative Service Providers"

## 2021-12-16 ENCOUNTER — Encounter: Payer: Self-pay | Admitting: Rehabilitative and Restorative Service Providers"

## 2021-12-16 ENCOUNTER — Ambulatory Visit (INDEPENDENT_AMBULATORY_CARE_PROVIDER_SITE_OTHER): Payer: 59 | Admitting: Plastic Surgery

## 2021-12-16 VITALS — BP 151/103 | HR 81 | Ht 64.5 in | Wt 162.4 lb

## 2021-12-16 DIAGNOSIS — M25642 Stiffness of left hand, not elsewhere classified: Secondary | ICD-10-CM

## 2021-12-16 DIAGNOSIS — M25532 Pain in left wrist: Secondary | ICD-10-CM

## 2021-12-16 DIAGNOSIS — M6281 Muscle weakness (generalized): Secondary | ICD-10-CM

## 2021-12-16 DIAGNOSIS — M793 Panniculitis, unspecified: Secondary | ICD-10-CM

## 2021-12-16 NOTE — Progress Notes (Signed)
Referring Provider Olive Bass, FNP [address] [place],  [place] [address]   CC:  Patient is bothered by her umbilicus which she says is recessed and has an odor  Laura Fields is an 61 y.o. female.  HPI: Patient is a 61 year old who has had some recent weight loss in the last year.  She is gone from 173 162 pounds.  She notices that she has an odor that comes from her umbilicus and she has a skin fold that causes significant hooding over the umbilicus.  She has seen general surgery and they do not feel she has an umbilical hernia.  Has a prior abdominal scar from hysterectomy.  Allergies  Allergen Reactions   Rocephin [Ceftriaxone Sodium In Dextrose] Hives    Outpatient Encounter Medications as of 12/16/2021  Medication Sig   albuterol (VENTOLIN HFA) 108 (90 Base) MCG/ACT inhaler INHALE 2 PUFFS INTO THE LUNGS EVERY 4 HOURS AS NEEDED FOR WHEEZE OR FOR SHORTNESS OF BREATH   AMBULATORY NON FORMULARY MEDICATION Apply 1 application topically at bedtime. Medication Name: Compounded Hydroquinone 8%, Tretinoin 0.025%, Kojic Acid 1%, Niacinamide 4%, Fluocinolone 0.025% Cream (S[employer])   azelastine (ASTELIN) 0.1 % nasal spray Place 2 sprays into both nostrils 2 (two) times daily as needed for rhinitis.   Azelastine HCl 0.15 % SOLN Place 2 sprays into the nose 2 (two) times daily as needed.   baclofen (LIORESAL) 10 MG tablet Take 10 mg by mouth 3 (three) times daily.   benzonatate (TESSALON) 100 MG capsule Take 2 capsules (200 mg total) by mouth 3 (three) times daily as needed for cough.   Carbinoxamine Maleate 4 MG TABS TAKE 1 TABLET EVERY 8 HOURS FOR RUNNY NOSE.   cyclobenzaprine (FLEXERIL) 5 MG tablet Take 5 mg by mouth 3 (three) times daily as needed for muscle spasms. At night time   desonide (DESOWEN) 0.05 % ointment APPLY 1 APPLICATION TOPICALLY 2 TIMES DAILY AS NEEDED TO RED ITCHY AREAS TO THE FACE.   DULoxetine (CYMBALTA) 60 MG capsule Take 60 mg by  mouth daily.   Epinastine HCl 0.05 % ophthalmic solution Apply to eye as needed.   famotidine (PEPCID) 20 MG tablet TAKE 1 TABLET BY MOUTH TWICE A DAY   flunisolide (NASALIDE) 25 MCG/ACT (0.025%) SOLN 2 SPRAYS PER NOSTRIL 2-3 TIMES PER DAY AS NEEDED FOR STUFFY NOSE.   gabapentin (NEURONTIN) 100 MG capsule Take 1 capsule (100 mg total) by mouth 3 (three) times daily. (Patient taking differently: Take 100 mg by mouth as needed.)   hydrOXYzine (ATARAX/VISTARIL) 25 MG tablet Take 1 tablet (25 mg total) by mouth 3 (three) times daily as needed for itching.   meloxicam (MOBIC) 15 MG tablet Take 15 mg by mouth daily.   montelukast (SINGULAIR) 10 MG tablet TAKE 1 TABLET BY MOUTH EVERYDAY AT BEDTIME   Olopatadine HCl (PATADAY) 0.2 % SOLN Place 1 drop into both eyes daily as needed.   Olopatadine HCl 0.6 % SOLN PLACE 2 SPRAYS INTO BOTH NOSTRILS 2 (TWO) TIMES DAILY AS NEEDED.   ondansetron (ZOFRAN) 4 MG tablet Take 1 tablet (4 mg total) by mouth every 8 (eight) hours as needed.   pantoprazole (PROTONIX) 40 MG tablet Take 1 tablet (40 mg total) by mouth daily.   phenazopyridine (PYRIDIUM) 100 MG tablet Take 1 tablet (100 mg total) by mouth 3 (three) times daily as needed for pain.   polyethylene glycol powder (GLYCOLAX/MIRALAX) 17 GM/SCOOP powder Take 17g daily   pregabalin (LYRICA)  150 MG capsule Take 150 mg by mouth 2 (two) times daily.   SPIRIVA RESPIMAT 1.25 MCG/ACT AERS INHALE 2 PUFFS BY MOUTH INTO THE LUNGS DAILY   SYMBICORT 160-4.5 MCG/ACT inhaler INHALE 2 PUFFS INTO THE LUNGS TWICE A DAY   [DISCONTINUED] Azelastine-Fluticasone (DYMISTA) 137-50 MCG/ACT SUSP 1 spray per nostril twice daily as needed   No facility-administered encounter medications on file as of 12/16/2021.     Past Medical History:  Diagnosis Date   Allergy    Anxiety    Arthritis    Asthma    Depression    Fibromyalgia    Intrinsic atopic dermatitis 12/01/2020   Osteoporosis    Raynaud's disease    Sciatica     Past  Surgical History:  Procedure Laterality Date   ABDOMINAL HYSTERECTOMY     ACHILLES TENDON REPAIR Right 07/02/2021   AUGMENTATION MAMMAPLASTY Bilateral 1999   Saline/ under muscle   back nerve     block   COLONOSCOPY  09/23/2013   [facility]   colonoscopy with endo  11/30/2021   GASTRIC BYPASS     KNEE ARTHROSCOPY     mass removable     on thumb   STOMACH SURGERY      Family History  Problem Relation Age of Onset   Hypertension Mother    Stomach cancer Other    Rectal cancer Other    Esophageal cancer Other    Colon polyps Other    Colon cancer Other    Allergic rhinitis Neg Hx    Angioedema Neg Hx    Asthma Neg Hx    Eczema Neg Hx    Immunodeficiency Neg Hx    Urticaria Neg Hx     Social history: No tobacco use, no illicit drug use.   Review of Systems General: Denies fevers, chills, weight loss CV: Denies chest pain, shortness of breath, palpitations   Physical Exam Vitals with BMI 12/16/2021 12/02/2021 11/30/2021  Height 5' 4.5" 5\' 5"  -  Weight 162 lbs 6 oz 166 lbs 3 oz -  BMI 27.46 27.66 -  Systolic 151 138  Diastolic 103 88 93  Pulse 81 - 60    General:  No acute distress,  Alert and oriented, Non-Toxic, Normal speech and affect Abdomen: Excess skin above and below the umbilicus.  Some adipose and skin below the umbilicus.  Patient does have some overhanging of her pannus.  Her umbilicus is recessed between skin folds.  Well-healed scar in the suprapubic area, horizontal.  Assessment/Plan Order to improve the odor associated with her umbilicus I recommended abdominoplasty.  We can remove the excess skin that is causing the fold over her umbilicus and very inset the umbilicus into a thinner part of her abdomen.  Told that there is no guarantee that this will completely resolve her concerns with her umbilicus but has a good chance of helping with this and will also give her excellent abdominal contour.  462 12/16/2021, 12:12 PM

## 2021-12-16 NOTE — Therapy (Signed)
OUTPATIENT OCCUPATIONAL THERAPY TREATMENT NOTE   Patient Name: Laura Fields MRN: [id number] DOB:1961/01/29, 61 y.o., female Today's Date: 12/16/2021  PCP: Olive Bass, FNP REFERRING PROVIDER: Eldred Manges, MD   OT End of Session - 12/16/21 1257     Visit Number 2    Number of Visits 8    Date for OT Re-Evaluation 01/07/22    OT Start Time 1300    OT Stop Time 1338    OT Time Calculation (min) 38 min    Equipment Utilized During Treatment orthotic materials    Activity Tolerance Patient tolerated treatment well;No increased pain    Behavior During Therapy [facility] for tasks assessed/performed             Past Medical History:  Diagnosis Date   Allergy    Anxiety    Arthritis    Asthma    Depression    Fibromyalgia    Intrinsic atopic dermatitis 12/01/2020   Osteoporosis    Raynaud's disease    Sciatica    Past Surgical History:  Procedure Laterality Date   ABDOMINAL HYSTERECTOMY     ACHILLES TENDON REPAIR Right 07/02/2021   AUGMENTATION MAMMAPLASTY Bilateral 1999   Saline/ under muscle   back nerve     block   COLONOSCOPY  09/23/2013   [facility]   colonoscopy with endo  11/30/2021   GASTRIC BYPASS     KNEE ARTHROSCOPY     mass removable     on thumb   STOMACH SURGERY     Patient Active Problem List   Diagnosis Date Noted   Hand cramps 11/26/2021   Left hand weakness 10/11/2021   Numbness and tingling in left hand 09/02/2021   Gastroesophageal reflux disease 07/28/2021   Intrinsic atopic dermatitis 12/01/2020   Dysfunction of both eustachian tubes 12/01/2020   Pruritic rash 02/12/2020   Allergic conjunctivitis of both eyes 02/12/2020   Seasonal allergic rhinitis with a nonallergic component 12/25/2019   Allergic conjunctivitis 12/25/2019   Moderate persistent asthma without complication 12/25/2019    ONSET DATE: Chronic issues since 1996, per pt report   REFERRING DIAG:  R25.2 (ICD-10-CM) - Hand cramps  R29.898 (ICD-10-CM) - Left  hand weakness  R20.0,R20.2 (ICD-10-CM) - Numbness and tingling in left hand  M79.642 (ICD-10-CM) - Pain in left hand    THERAPY DIAG:  Stiffness of left hand, not elsewhere classified  Pain in left wrist  Muscle weakness (generalized)   PERTINENT HISTORY:  Currently PT for Rt ankle pain;  Per hand surgeon evaluation on 11/26/21: "61 year old right-hand-dominant female who presents for follow-up of left hand weakness.  She describes pain in her distal forearm and hand that is intermittent and is associated with "locking up" of her index and middle fingers.  This happens randomly when she is doing activity such as cooking cleaning or otherwise using her hands.  There are no certain activities that bring this on.Marland KitchenMarland KitchenHer exam today is totally normal.  We discussed the nature of hand cramps and preventive strategies such as staying hydrated and stretching the hand intermittently during activities." PmHx significant for fibromyalgia, OA, osteoporosis, low back pain, past spinal ablation, muscle spasms, and other serious issues.   PRECAUTIONS: none  SUBJECTIVE: She states wrist not swollen today, has been doing stretches. Her leg hurts worse today. Hand in med nerve still still numb at night.   PAIN:  Are you having pain? Yes NPRS scale: 4/10 Pain location: wrist Pain orientation: Left  PAIN TYPE: aching Pain  description: intermittent  Aggravating factors: overuse Relieving factors: rest, heat    OBJECTIVE:  (All from eval 12/09/21 unless otherwise specified)  DIAGNOSTIC FINDINGS: Imaging from Oct '22: "Imaging: 3 views of bilateral wrist taken today reviewed interpreted by me.  They demonstrate no acute bony injury.  There is no evidence of instability or dislocation.  There is no evidence of significant degenerative changes."   ADLs: Overall ADLs: Pt states having significant difficulties with using containers, holding/grasping objects, using silverware, sleep numbness and disturbance,  etc.    FUNCTIONAL OUTCOME MEASURES: Quick Dash 45% today impairment      SENSATION: Light touch: Impaired: decreased in ulnar & median nerve dist as seen by [facility]  Stereognosis: Appears intact Hot/Cold: Appears intact Proprioception: Appears intact Semmes Weinstein Monofilament scale:Lt hand:  Diminished protective (3.84 - 4.31) @ thumb; Diminished light touch (3.61) @ IF & SF; "normal" (2.83) radial side of ring finger.   These results are fairly contradictory in median nerve reports.    COORDINATION: 9 Hole Peg test: Right:  sec; Left:  sec Box and Blocks: Comments: Will be assessed as needed, but has full fist and full opposition despite long (1") fingernails today      EDEMA: Pt has some "bulging" at over Lt flexor retinaculum with wrist ext, but circumferential measures are equal Lt to Rt wrists (15.5cm)    MUSCLE TONE: WNL, no twitching or cramping noted today    SKIN INTEGRITY: no issues or breakdown    PALPATION: Pt reports TTP over flexor retinaculum, but no soreness at thumb CMC, MPJs, etc.    UE AROM/PROM: typical/normal from forearms and proximal b/l   A/PROM Right 12/09/2021 Left 12/09/2021  Wrist flexion 45 26 (states pain)  Wrist extension 75 70  Wrist ulnar deviation   46  Wrist radial deviation   20  (Blank rows = not tested)   HAND A/PROM: Can make full fist b/l and fully oppose b/l, no pain or c/o      UE MMT:   MMT Left 12/09/2021  Shoulder flexion    Shoulder abduction    Shoulder adduction    Shoulder extension    Shoulder internal rotation    Shoulder external rotation    Middle trapezius    Lower trapezius    Elbow flexion    Elbow extension    Wrist flexion 4-/5 reports no pain  Wrist extension 4-/5 reports pain  Wrist ulnar deviation 4+/5  Wrist radial deviation 4+/5   (Blank rows = not tested)   HAND FUNCTION:   Grip strength: Right: 37 lbs; Left: 13 lbs (states pain)    Comments: strangely very weak b/l (could be linked to  tight flexion b/l), try 5-position grip testing to check for full effort in upcoming sessions    TODAY'S TREATMENT:  12/16/21: OT reviews wrist flex stretch and instructs on new prayer stretches and tendon glides to help non-painfully with tendon excursion and flexibility.  She tolerates well today. OT also fabricates wrist cock-up orthotic for night wear to help prevent CTS. She is edu to take off if causing problem & wear consistently. Bring back for any adjustments. It fits well, blocks wrist in ~5* ext and causes no pressure now.   Exercises Seated Wrist Flexion Stretch - 3-4 x daily - 3-5 reps - 15 hold Wrist Prayer Stretch - 2-3 x daily - 3-5 reps - 15 sec hold Tendon Glides - 3-4 x daily - 5- 10 reps - 2-3 seconds hold  12/09/21: OT edu pt for nervous symptom management pain relief in terms of staying calm, trying to reduce anxiety (linked to Raynaud's and fibro, etc.). Also edu for energy conservation as she states doing high periods of activity at once (like meal prep for week, etc.). She states understanding and doing some modifications already. This may require f/u education.  OT also educates on first HEP stretches to improve wrist flexion with coaching to relax when performing. Also edu to monitor for guarding and attempt to relax to avoid spasms and tendonitis, etc.      PATIENT EDUCATION: Education details: See tx section above for details Person educated: Patient Education method: Explanation, Demonstration, and Handouts Education comprehension: verbalized understanding, returned demonstration, and needs further education     HOME EXERCISE PROGRAM: Access Code: [email or web address] URL: [email or web address] Date: 12/16/2021 Prepared by: Fannie Knee   ASSESSMENT:   CLINICAL IMPRESSION: 12/16/21: Pt seems to be doing well for whatever reason, she is trying to keep a journal of symptoms to help tease out how outside factors effect her.   12/09/21: Patient is a 61 y.o.  female who was seen today for occupational therapy evaluation and treatment for Lt hand/wrist pain. She also c/o numbness in median nerve distribution worse at night, for which she may benefit from orthotic bracing (though sensation tests were not conclusive today). OT worried that her numerous, chronic issues are systemic in nature- related to nervous system, however OT will attempt to manage UE complaints with conservative management to address tightness and weakness, possible poor habits and overuse.        GOALS: Goals reviewed with patient? No   SHORT TERM GOALS: (STG required if POC>30 days)   LONG TERM GOALS:    LTG Name Target Date Goal status  1 Pt will decrease impairment per Quick DASH from 45% to 20% or better for increased quality of life 01/07/22 INITIAL  2 Pt will increase Lt wrist flexion A/ROM from 26* to at least 45* for better flexibility to grasp objects  01/07/22 INITIAL  3 Pt will increase Lt grip strength from 13# painfully to at least 25# for BADL home use  01/07/22 INITIAL  4 Pt will demo or state at least 5 coping mechanisms to deal with chronic pain, tightness, numbness, etc. (Examples: better postures/sleep postures, stretches, light isometric strength, activity modifications, energy conservation, built up handles and A/E, taking appropriate meds, decrease anxiety, etc.)  01/07/22 INITIAL    PLAN: OT FREQUENCY: 2x/week   OT DURATION: 4 weeks   PLANNED INTERVENTIONS: self care/ADL training, therapeutic exercise, therapeutic activity, neuromuscular re-education, manual therapy, passive range of motion, splinting, electrical stimulation, ultrasound, fluidotherapy, compression bandaging, moist heat, cryotherapy, contrast bath, patient/family education, psychosocial skills training, energy conservation, coping strategies training, and DME and/or AE instructions   PLAN FOR NEXT SESSION: Check orthotic, check HEP, add light isometrics or eccentrics when tolerated as well as  grip training.     RECOMMENDED OTHER SERVICES: She is already followed by pain mgmt clinic, she may benefit from mental health counseling due to stress and being on meds for anxiety, etc. Also would benefit from comprehensive med review with clinical pharmacist or holistic MD.    CONSULTED AND AGREED WITH PLAN OF CARE: Patient          Fannie Knee, OTR/L, CHT 12/16/2021, 1:43 PM

## 2021-12-17 ENCOUNTER — Ambulatory Visit (HOSPITAL_BASED_OUTPATIENT_CLINIC_OR_DEPARTMENT_OTHER): Payer: 59 | Admitting: Physical Therapy

## 2021-12-17 ENCOUNTER — Encounter: Payer: 59 | Admitting: Physical Therapy

## 2021-12-21 ENCOUNTER — Ambulatory Visit (INDEPENDENT_AMBULATORY_CARE_PROVIDER_SITE_OTHER): Payer: 59 | Admitting: Family Medicine

## 2021-12-21 ENCOUNTER — Other Ambulatory Visit: Payer: Self-pay

## 2021-12-21 ENCOUNTER — Encounter: Payer: Self-pay | Admitting: Family Medicine

## 2021-12-21 VITALS — BP 118/78 | HR 87 | Temp 98.0°F | Resp 17 | Ht 64.0 in | Wt 168.0 lb

## 2021-12-21 DIAGNOSIS — H1013 Acute atopic conjunctivitis, bilateral: Secondary | ICD-10-CM

## 2021-12-21 DIAGNOSIS — K219 Gastro-esophageal reflux disease without esophagitis: Secondary | ICD-10-CM | POA: Diagnosis not present

## 2021-12-21 DIAGNOSIS — J3089 Other allergic rhinitis: Secondary | ICD-10-CM | POA: Diagnosis not present

## 2021-12-21 DIAGNOSIS — J4541 Moderate persistent asthma with (acute) exacerbation: Secondary | ICD-10-CM

## 2021-12-21 DIAGNOSIS — J45909 Unspecified asthma, uncomplicated: Secondary | ICD-10-CM | POA: Insufficient documentation

## 2021-12-21 NOTE — Patient Instructions (Addendum)
Asthma Begin Alvesco 80-1 puff twice a day for the next 2 weeks or until cough and wheeze free Continue montelukast 10 mg once a day to prevent cough or wheeze Continue Symbicort 160-2 puffs twice a day with a spacer to prevent cough and wheeze Continue Spiriva 2 puffs once a day to prevent cough or wheeze Continue albuterol 2 puffs every 4 hours as needed for cough or wheeze OR Instead use albuterol 0.083% solution via nebulizer one unit vial every 4 hours as needed for cough or wheeze Consider Tespire injections for better asthma control Call the clinic if your symptoms worsen, do not improve, or if you develop a fever. If no improvement over the next 1 to 2 days, lets move forward with chest x-ray  Allergic rhinitis Continue avoidance measures directed toward grass pollen and weed pollen as listed below Begin Xyzal 5 mg once a day as needed for a runny nose or itch. This will replace carbinoxamine.  Remember to rotate to a different antihistamine about every 3 months. Some examples of over the counter antihistamines include Zyrtec (cetirizine), Xyzal (levocetirizine), Allegra (fexofenadine), and Claritin (loratidine).  Continueazelastine 2 sprays in each nostril twice a day as needed for a runny nose Continue flunisolide- 2 sprays in each nostril 2 to 3 times a day.  In the right nostril, point the applicator out toward the right ear. In the left nostril, point the applicator out toward the left ear Consider saline nasal rinses as needed for nasal symptoms. Use this before any medicated nasal sprays for best result Consider allergen immunotherapy if medications are not controlling your symptoms of allergic rhinitis. Call the clinic to schedule your first allergy injection  Allergic conjunctivitis Continue epinastine eye drops 1 drop in each eye twice a day as needed for red, itchy eyes.  Atopic dermatitis Continue a daily moisturizing routine Continue desonide 0.05% ointment to red, itchy  areas twice a day as needed. Do not use this medication longer than 3 weeks in a row.   Reflux Begin dietary lifestyle modifications as listed below Continue to follow up with your gastroenterologist for evaluation and treatment of GI issues  Call the clinic if this treatment plan is not working well for you  Follow up in 2 months or sooner if needed.  Reducing Pollen Exposure The American Academy of Allergy, Asthma and Immunology suggests the following steps to reduce your exposure to pollen during allergy seasons. Do not hang sheets or clothing out to dry; pollen may collect on these items. Do not mow lawns or spend time around freshly cut grass; mowing stirs up pollen. Keep windows closed at night.  Keep car windows closed while driving. Minimize morning activities outdoors, a time when pollen counts are usually at their highest. Stay indoors as much as possible when pollen counts or humidity is high and on windy days when pollen tends to remain in the air longer. Use air conditioning when possible.  Many air conditioners have filters that trap the pollen spores. Use a HEPA room air filter to remove pollen form the indoor air you breathe.   Lifestyle Changes for Controlling GERD When you have GERD, stomach acid feels as if its backing up toward your mouth. Whether or not you take medication to control your GERD, your symptoms can often be improved with lifestyle changes.   Raise Your Head Reflux is more likely to strike when youre lying down flat, because stomach fluid can flow backward more easily. Raising the head of your  bed 4-6 inches can help. To do this: Slide blocks or books under the legs at the head of your bed. Or, place a wedge under the mattress. Many foam stores can make a suitable wedge for you. The wedge should run from your waist to the top of your head. Dont just prop your head on several pillows. This increases pressure on your stomach. It can make GERD  worse.  Watch Your Eating Habits Certain foods may increase the acid in your stomach or relax the lower esophageal sphincter, making GERD more likely. Its best to avoid the following: Coffee, tea, and carbonated drinks (with and without caffeine) Fatty, fried, or spicy food Mint, chocolate, onions, and tomatoes Any other foods that seem to irritate your stomach or cause you pain  Relieve the Pressure Eat smaller meals, even if you have to eat more often. Dont lie down right after you eat. Wait a few hours for your stomach to empty. Avoid tight belts and tight-fitting clothes. Lose excess weight.  Tobacco and Alcohol Avoid smoking tobacco and drinking alcohol. They can make GERD symptoms worse.

## 2021-12-21 NOTE — Progress Notes (Signed)
[address] [place] [address] Dept: [phone]  FOLLOW UP NOTE  Patient ID: Laura Fields, female    DOB: 04-14-1961  Age: 61 y.o. MRN: [id number] Date of Office Visit: 12/21/2021  Assessment  Chief Complaint: Asthma (Pt states that she's having chest tightness, SOB, cough, wheeze, itchy watery eyes, itchy throat and runny nose.)  HPI Laura Fields is a 61 year old female who presenta to the clinic for a follow up visit. She was last seen in this clinic on 11/25/2021 for evaluation of asthma with acute exacerbation requiring a prednisone taaper, allergic rhinitis, allergic conjunctivitis, atopic dermatitis, reflux, and eustatian tube dysfunction.  At today's visit, she reports her asthma has been poorly controlled with symptoms including shortness of breath, wheeze, and cough producing clear mucus.  She continues montelukast 10 mg once a day, Symbicort 160-2 puffs twice a day, Spiriva 1.25 mcg 2 puffs once a day and has been using albuterol about 2 times a day for the last week with moderate relief of symptoms.  She is currently in the process of applying for patient assistance for Tezspire injections.  Allergic rhinitis is reported as poorly controlled with symptoms including clear rhinorrhea, nasal congestion, sneeze, and postnasal drainage.  She continues flunisolide, azelastine, and saline rinses as well as carbinoxamine 4 mg twice a day.  Her last environmental allergy skin testing was on 12/25/2019 and was positive to grass pollen and weed pollen.  She is considering allergen immunotherapy at this time.  Allergic conjunctivitis is reported as poorly controlled with symptoms including red and itchy eyes for which she continues epinastine with relief of symptoms.  Reflux is reported as well controlled with pantoprazole as previously prescribed.  Her current medications are listed in the chart.   Drug Allergies:  Allergies  Allergen Reactions   Rocephin [Ceftriaxone Sodium In Dextrose]  Hives    Physical Exam: BP 118/78    Pulse 87    Temp 98 F (36.7 C) (Temporal)    Resp 17    Ht 5\' 4"  (1.626 m)    Wt 168 lb (76.2 kg)    SpO2 98%    BMI 28.84 kg/m    Physical Exam Vitals reviewed.  Constitutional:      Appearance: Normal appearance.  HENT:     Head: Normocephalic and atraumatic.     Right Ear: Tympanic membrane normal.     Left Ear: Tympanic membrane normal.     Nose:     Comments: Bilateral nares slightly erythematous with clear nasal drainage noted.  Pharynx normal.  Ears normal.  Eyes normal.    Mouth/Throat:     Pharynx: Oropharynx is clear.  Eyes:     Conjunctiva/sclera: Conjunctivae normal.  Cardiovascular:     Rate and Rhythm: Normal rate and regular rhythm.     Heart sounds: Normal heart sounds. No murmur heard. Pulmonary:     Effort: Pulmonary effort is normal.     Breath sounds: Normal breath sounds.     Comments: Lungs clear to auscultation Musculoskeletal:        General: Normal range of motion.     Cervical back: Normal range of motion and neck supple.  Skin:    General: Skin is warm and dry.  Neurological:     Mental Status: She is alert and oriented to person, place, and time.  Psychiatric:        Mood and Affect: Mood normal.        Behavior: Behavior normal.  Thought Content: Thought content normal.        Judgment: Judgment normal.    Diagnostics: FVC 2.60, FEV1 1.78.  Predicted FVC 2.73, predicted FEV1 2.17.  Spirometry indicates normal ventilatory function.  Assessment and Plan: 1. Not well controlled moderate persistent asthma with acute exacerbation   2. Seasonal allergic rhinitis with a nonallergic component   3. Allergic conjunctivitis of both eyes   4. Gastroesophageal reflux disease, unspecified whether esophagitis present     Patient Instructions  Asthma Begin Alvesco 80-2 puffs twice a day for the next 2 weeks or until cough and wheeze free Continue montelukast 10 mg once a day to prevent cough or  wheeze Continue Symbicort 160-2 puffs twice a day with a spacer to prevent cough and wheeze Continue Spiriva 2 puffs once a day to prevent cough or wheeze Continue albuterol 2 puffs every 4 hours as needed for cough or wheeze OR Instead use albuterol 0.083% solution via nebulizer one unit vial every 4 hours as needed for cough or wheeze Consider Tespire injections for better asthma control Call the clinic if your symptoms worsen, do not improve, or if you develop a fever. If no improvement over the next 1 to 2 days, lets move forward with chest x-ray  Allergic rhinitis Continue avoidance measures directed toward grass pollen and weed pollen as listed below Begin Xyzal 5 mg once a day as needed for a runny nose or itch. This will replace carbinoxamine.  Remember to rotate to a different antihistamine about every 3 months. Some examples of over the counter antihistamines include Zyrtec (cetirizine), Xyzal (levocetirizine), Allegra (fexofenadine), and Claritin (loratidine).  Continueazelastine 2 sprays in each nostril twice a day as needed for a runny nose Continue flunisolide- 2 sprays in each nostril 2 to 3 times a day.  In the right nostril, point the applicator out toward the right ear. In the left nostril, point the applicator out toward the left ear Consider saline nasal rinses as needed for nasal symptoms. Use this before any medicated nasal sprays for best result Consider allergen immunotherapy if medications are not controlling your symptoms of allergic rhinitis. Call the clinic to schedule your first allergy injection  Allergic conjunctivitis Continue epinastine eye drops 1 drop in each eye twice a day as needed for red, itchy eyes.  Atopic dermatitis Continue a daily moisturizing routine Continue desonide 0.05% ointment to red, itchy areas twice a day as needed. Do not use this medication longer than 3 weeks in a row.   Reflux Begin dietary lifestyle modifications as listed  below Continue to follow up with your gastroenterologist for evaluation and treatment of GI issues  Call the clinic if this treatment plan is not working well for you  Follow up in 2 months or sooner if needed.   Return in about 2 months (around 02/18/2022), or if symptoms worsen or fail to improve.    Thank you for the opportunity to care for this patient.  Please do not hesitate to contact me with questions.  Thermon Leyland, FNP [facility]

## 2021-12-22 ENCOUNTER — Ambulatory Visit (INDEPENDENT_AMBULATORY_CARE_PROVIDER_SITE_OTHER): Payer: 59 | Admitting: Rehabilitative and Restorative Service Providers"

## 2021-12-22 ENCOUNTER — Telehealth: Payer: Self-pay | Admitting: *Deleted

## 2021-12-22 DIAGNOSIS — M25532 Pain in left wrist: Secondary | ICD-10-CM | POA: Diagnosis not present

## 2021-12-22 DIAGNOSIS — M25642 Stiffness of left hand, not elsewhere classified: Secondary | ICD-10-CM | POA: Diagnosis not present

## 2021-12-22 DIAGNOSIS — M6281 Muscle weakness (generalized): Secondary | ICD-10-CM

## 2021-12-22 NOTE — Therapy (Signed)
OUTPATIENT OCCUPATIONAL THERAPY TREATMENT NOTE   Patient Name: Laura Fields MRN: [id number] DOB:Feb 14, 1961, 61 y.o., female Today's Date: 12/22/2021  PCP: Olive Bass, FNP REFERRING PROVIDER: Olive Bass,*   OT End of Session - 12/22/21 1019     Visit Number 3    Number of Visits 8    Date for OT Re-Evaluation 01/07/22    OT Start Time 1020    OT Stop Time 1059    OT Time Calculation (min) 39 min    Equipment Utilized During Treatment --    Activity Tolerance Patient tolerated treatment well;No increased pain    Behavior During Therapy [facility] for tasks assessed/performed              Past Medical History:  Diagnosis Date   Allergy    Anxiety    Arthritis    Asthma    Depression    Fibromyalgia    Intrinsic atopic dermatitis 12/01/2020   Osteoporosis    Raynaud's disease    Sciatica    Past Surgical History:  Procedure Laterality Date   ABDOMINAL HYSTERECTOMY     ACHILLES TENDON REPAIR Right 07/02/2021   AUGMENTATION MAMMAPLASTY Bilateral 1999   Saline/ under muscle   back nerve     block   COLONOSCOPY  09/23/2013   [facility] End[facility]   colonoscopy with endo  11/30/2021   GASTRIC BYPASS     KNEE ARTHROSCOPY     mass removable     on thumb   STOMACH SURGERY     Patient Active Problem List   Diagnosis Date Noted   Asthma, not well controlled 12/21/2021   Hand cramps 11/26/2021   Left hand weakness 10/11/2021   Numbness and tingling in left hand 09/02/2021   Gastroesophageal reflux disease 07/28/2021   Intrinsic atopic dermatitis 12/01/2020   Dysfunction of both eustachian tubes 12/01/2020   Pruritic rash 02/12/2020   Allergic conjunctivitis of both eyes 02/12/2020   Seasonal allergic rhinitis with a nonallergic component 12/25/2019   Allergic conjunctivitis 12/25/2019   Moderate persistent asthma without complication 12/25/2019    ONSET DATE: Chronic issues since 1996, per pt report   REFERRING DIAG:  R25.2 (ICD-10-CM) - Hand  cramps  R29.898 (ICD-10-CM) - Left hand weakness  R20.0,R20.2 (ICD-10-CM) - Numbness and tingling in left hand  M79.642 (ICD-10-CM) - Pain in left hand    THERAPY DIAG:  Stiffness of left hand, not elsewhere classified  Pain in left wrist  Muscle weakness (generalized)   PERTINENT HISTORY:  Currently PT for Rt ankle pain;  Per hand surgeon evaluation on 11/26/21: "60 year old right-hand-dominant female who presents for follow-up of left hand weakness.  She describes pain in her distal forearm and hand that is intermittent and is associated with "locking up" of her index and middle fingers.  This happens randomly when she is doing activity such as cooking cleaning or otherwise using her hands.  There are no certain activities that bring this on.Marland KitchenMarland KitchenHer exam today is totally normal.  We discussed the nature of hand cramps and preventive strategies such as staying hydrated and stretching the hand intermittently during activities." PmHx significant for fibromyalgia, OA, osteoporosis, low back pain, past spinal ablation, muscle spasms, and other serious issues.   PRECAUTIONS: none  SUBJECTIVE:  She states no change in numbness yet, orthotic needs adjusted, wrist slightly painful today due to weather possibly. She states having almost non-stop medical appointments scheduled this week for allergies, sx for cyst removal, sx consult for her belly button,  PT for leg, and more. This is likely overwhelming her schedule and normal occupational participation. She is encouraged to "choose her battles" and try to do pain-free recreational activities too.   PAIN:  Are you having pain? Yes NPRS scale: 5/10 Pain location: wrist Pain orientation: Left  PAIN TYPE: aching Pain description: intermittent  Aggravating factors: overuse Relieving factors: rest, heat    OBJECTIVE:  (All from eval 12/09/21 unless otherwise specified)  DIAGNOSTIC FINDINGS: Imaging from Oct '22: "Imaging: 3 views of bilateral wrist  taken today reviewed interpreted by me.  They demonstrate no acute bony injury.  There is no evidence of instability or dislocation.  There is no evidence of significant degenerative changes."   ADLs: Overall ADLs: Pt states having significant difficulties with using containers, holding/grasping objects, using silverware, sleep numbness and disturbance, etc.    FUNCTIONAL OUTCOME MEASURES: Quick Dash 45% today impairment      SENSATION: Light touch: Impaired: decreased in ulnar & median nerve dist [facility]  Stereognosis: Appears intact Hot/Cold: Appears intact Proprioception: Appears intact Semmes Weinstein Monofilament scale:Lt hand:  Diminished protective (3.84 - 4.31) @ thumb; Diminished light touch (3.61) @ IF & SF; "normal" (2.83) radial side of ring finger.   These results are fairly contradictory in median nerve reports.    COORDINATION: 9 Hole Peg test: Right:  sec; Left:  sec Box and Blocks: Comments: Will be assessed as needed, but has full fist and full opposition despite long (1") fingernails today      EDEMA: Pt has some "bulging" at over Lt flexor retinaculum with wrist ext, but circumferential measures are equal Lt to Rt wrists (15.5cm)    MUSCLE TONE: WNL, no twitching or cramping noted today    SKIN INTEGRITY: no issues or breakdown    PALPATION: Pt reports TTP over flexor retinaculum, but no soreness at thumb CMC, MPJs, etc.    UE AROM/PROM: typical/normal from forearms and proximal b/l   A/PROM Right 12/09/2021 Left 12/09/2021  Wrist flexion 45 26 (states pain)  Wrist extension 75 70  Wrist ulnar deviation   46  Wrist radial deviation   20  (Blank rows = not tested)   HAND A/PROM: Can make full fist b/l and fully oppose b/l, no pain or c/o      UE MMT:   MMT Left 12/09/2021  Elbow flexion    Elbow extension    Wrist flexion 4-/5 reports no pain  Wrist extension 4-/5 reports pain  Wrist ulnar deviation 4+/5  Wrist radial deviation 4+/5    (Blank rows = not tested)   HAND FUNCTION:   Grip strength: Right: 37 lbs; Left: 13 lbs (states pain)    Comments: strangely very weak b/l (could be linked to tight flexion b/l), try 5-position grip testing to check for full effort in upcoming sessions    TODAY'S TREATMENT:  12/22/21: While pt is on MH for 5 mins, OT adjusts orthotic straps and sizing, which fits comfortably when finished. OT reviews HEP expectations with pt after that & performs manual therapy- stretches with IASTM over volar FA, wrist and palm areas noting rough areas of fascia and muscle spasms in flexor groups near elbow. OT applies some manual stretches with light traction as well. She performs HEP back to OT for review, then  OT educates on new HEP for  nerve glides to help with any nerve compression. She tolerates these well and states feeling slightly sore in FA after IASTM, so OT recommends to  go home and use cold pack on arm.   Exercises Seated Wrist Flexion Stretch - 3-4 x daily - 3-5 reps - 15 hold Wrist Prayer Stretch - 2-3 x daily - 3-5 reps - 15 sec hold Tendon Glides - 3-4 x daily - 5- 10 reps - 2-3 seconds hold Median Nerve Flossing - 2-3 x daily - 1 sets - 5 reps - slowly hold Seated Median Nerve Glide - 2-3 x daily - 1 sets - 5 reps - slowly hold   12/16/21: OT reviews wrist flex stretch and instructs on new prayer stretches and tendon glides to help non-painfully with tendon excursion and flexibility.  She tolerates well today. OT also fabricates wrist cock-up orthotic for night wear to help prevent CTS. She is edu to take off if causing problem & wear consistently. Bring back for any adjustments. It fits well, blocks wrist in ~5* ext and causes no pressure now.   Exercises Seated Wrist Flexion Stretch - 3-4 x daily - 3-5 reps - 15 hold Wrist Prayer Stretch - 2-3 x daily - 3-5 reps - 15 sec hold Tendon Glides - 3-4 x daily - 5- 10 reps - 2-3 seconds hold    PATIENT EDUCATION: Education details: See tx  section above for details Person educated: Patient Education method: Explanation, Demonstration, and Handouts Education comprehension: verbalized understanding, returned demonstration, and needs further education     HOME EXERCISE PROGRAM: Access Code: [email or web address] URL: [email or web address] Prepared by: Fannie Knee   ASSESSMENT:   CLINICAL IMPRESSION: 12/22/21: Pt has management to help with nerves, stiffness in hand/wrist now- needs to get consistent. Is complicated by many other comorbidities   12/16/21: Pt seems to be doing well for whatever reason, she is trying to keep a journal of symptoms to help tease out how outside factors effect her.   12/09/21: Patient is a 61 y.o. female who was seen today for occupational therapy evaluation and treatment for Lt hand/wrist pain. She also c/o numbness in median nerve distribution worse at night, for which she may benefit from orthotic bracing (though sensation tests were not conclusive today). OT worried that her numerous, chronic issues are systemic in nature- related to nervous system, however OT will attempt to manage UE complaints with conservative management to address tightness and weakness, possible poor habits and overuse.    GOALS: Goals reviewed with patient? No   SHORT TERM GOALS: (STG required if POC>30 days)   LONG TERM GOALS:    LTG Name Target Date Goal status  1 Pt will decrease impairment per Quick DASH from 45% to 20% or better for increased quality of life 01/07/22 INITIAL  2 Pt will increase Lt wrist flexion A/ROM from 26* to at least 45* for better flexibility to grasp objects  01/07/22 INITIAL  3 Pt will increase Lt grip strength from 13# painfully to at least 25# for BADL home use  01/07/22 INITIAL  4 Pt will demo or state at least 5 coping mechanisms to deal with chronic pain, tightness, numbness, etc. (Examples: better postures/sleep postures, stretches, light isometric strength, activity modifications,  energy conservation, built up handles and A/E, taking appropriate meds, decrease anxiety, etc.)  01/07/22 INITIAL    PLAN: OT FREQUENCY: 2x/week   OT DURATION: 4 weeks   PLANNED INTERVENTIONS: self care/ADL training, therapeutic exercise, therapeutic activity, neuromuscular re-education, manual therapy, passive range of motion, splinting, electrical stimulation, ultrasound, fluidotherapy, compression bandaging, moist heat, cryotherapy, contrast bath, patient/family education, psychosocial skills training, energy conservation, coping strategies training, and  DME and/or AE instructions   PLAN FOR NEXT SESSION:  Check orthotic, review HEP, adjust as needed, try arm bike and PRE (eccentrics) if tolerated to increase blood flow and work on fascial gliding.    RECOMMENDED OTHER SERVICES: She is already followed by pain mgmt clinic, she may benefit from mental health counseling due to stress and being on meds for anxiety, etc. Also would benefit from comprehensive med review with clinical pharmacist or holistic MD.    CONSULTED AND AGREED WITH PLAN OF CARE: Patient       Fannie Knee, OTR/L, CHT 12/22/2021, 12:19 PM

## 2021-12-22 NOTE — Telephone Encounter (Signed)
Patient is calling because her foot (tendon) has been swollen for 4 days, today was the first day she was able to put a regular shoe on and it feels very uncomfortable. Please advise. ?

## 2021-12-23 ENCOUNTER — Telehealth: Payer: Self-pay | Admitting: Family Medicine

## 2021-12-23 ENCOUNTER — Other Ambulatory Visit: Payer: Self-pay

## 2021-12-23 ENCOUNTER — Ambulatory Visit (INDEPENDENT_AMBULATORY_CARE_PROVIDER_SITE_OTHER): Payer: 59 | Admitting: Dermatology

## 2021-12-23 DIAGNOSIS — L720 Epidermal cyst: Secondary | ICD-10-CM | POA: Diagnosis not present

## 2021-12-23 HISTORY — PX: CYST REMOVAL NECK: SHX6281

## 2021-12-23 MED ORDER — BUDESONIDE-FORMOTEROL FUMARATE 160-4.5 MCG/ACT IN AERO: INHALATION_SPRAY | RESPIRATORY_TRACT | 5 refills | Status: DC

## 2021-12-23 MED ORDER — ALVESCO 80 MCG/ACT IN AERS: 1.0000 | INHALATION_SPRAY | Freq: Two times a day (BID) | RESPIRATORY_TRACT | 0 refills | Status: DC

## 2021-12-23 MED ORDER — MONTELUKAST SODIUM 10 MG PO TABS: ORAL_TABLET | ORAL | 0 refills | Status: DC

## 2021-12-23 MED ORDER — SPIRIVA RESPIMAT 1.25 MCG/ACT IN AERS: INHALATION_SPRAY | RESPIRATORY_TRACT | 5 refills | Status: DC

## 2021-12-23 NOTE — Telephone Encounter (Signed)
Entered in error

## 2021-12-23 NOTE — Telephone Encounter (Signed)
Patient reports that her breathing remains stable, however, has not improved much since her visit to our clinic. She has recently started using Alvesco and will call the clinic on Monday with a report of how she is breathing. She understands that her breathing must be better controlled before beginning allergen immunotherapy. She will follow up with Dr. Maudie Mercury in the [facility] office for further evaluation.  ?

## 2021-12-23 NOTE — Patient Instructions (Signed)

## 2021-12-23 NOTE — Addendum Note (Signed)
Addended by: Hetty Blend on: 12/23/2021 03:08 PM   Modules accepted: Orders

## 2021-12-28 NOTE — Progress Notes (Signed)
Follow Up Note  RE: Laura Fields MRN: [id number] DOB: Mar 14, 1961 Date of Office Visit: 12/29/2021  Referring provider: Marrian Salvage,* Primary care provider: Marrian Salvage, [place]  Chief Complaint: Asthma (Doing about the same no changes)  History of Present Illness: I had the pleasure of seeing Laura Fields for a follow up visit at the [facility] on 12/29/2021. She is a 61 y.o. female, who is being followed for asthma, allergic rhinoconjunctivitis, atopic dermatitis and reflux. Her previous allergy office visit was on 12/21/2021 with Laura Fields, [place]. Today is a regular follow up visit.  Asthma: Currently having some coughing with some mucous. No post tussive emesis. Symptoms worsening and has been persistent "every since she has been coming here". Only nebulizer helps and she does not have a nebulizer at home.  No fevers/chills.  Currently on Symbicort 182mcg 2 puffs twice a day, Spiriva 2 puffs once a day, Alvesco 1 puff twice a day, Singulair daily. The Alvesco did not make a different and she does not like to take prednisone as it causes her to eat more and gain weight.   Taking albuterol 4 times a day with some benefit.  Using spacer only with Symbicort.  Had prednisone in February which helped. No prior asthma biologics.   CXR in November 2022. "IMPRESSION: No active cardiopulmonary disease."  Allergic rhino conjunctivitis Currently on Xyzal daily at night. Currently on azelastine and flunisolide 2 sprays per nostril twice a day. Only using eye drops as needed.  Using saline rinse twice a day.    Atopic dermatitis Stable and no issues.    Reflux Follows with GI.   Had a cyst removed from her wrist/hand recently.   Assessment and Plan: Laura Fields is a 61 y.o. female with: Not well controlled severe persistent asthma Persistent symptoms with coughing, mucous. Mild benefit with systemic steroids but does not like to take. Alvesco  showed no benefit. Last CXR in November 2022. No prior biologics.  Sample Tezspire injection given today and will continue every 4 weeks injection in the office. Daily controller medication(s): Symbicort 169mcg 2 puffs twice a day with spacer and rinse mouth afterwards. Spiriva 2 puffs once a day. Continue Singulair (montelukast) 10mg  daily at night. Nebulizer machine given in the office today and demonstrated proper use.  During upper respiratory infections/flares:  Start Pulmicort nebulizer twice a day for 1-2 weeks until your breathing symptoms return to baseline.  Pretreat with albuterol 2 puffs or albuterol nebulizer.  If you need to use your albuterol nebulizer machine back to back within 15-30 minutes with no relief then please go to the ER/urgent care for further evaluation.  May use albuterol rescue inhaler 2 puffs or nebulizer every 4 to 6 hours as needed for shortness of breath, chest tightness, coughing, and wheezing. May use albuterol rescue inhaler 2 puffs 5 to 15 minutes prior to strenuous physical activities. Monitor frequency of use.  Get spirometry at next visit. If no improvement, will get CXR next and possible cardiac work up.  Seasonal allergic rhinitis due to pollen Past history - 2021 skin testing was positive to grass, ragweed, weed. Interim history - still having some nasal symptoms.  Continue environmental control measures as below.  Take Xyzal 5 mg once a day as needed for a runny nose or itch. May take twice a day during flares. Use azelastine nasal spray 1-2 sprays per nostril twice a day as needed for runny nose/drainage. Use flunisolide nasal spray 1-2  sprays per nostril 2-3 times a day as needed for nasal congestion.  Nasal saline spray (i.e., Simply Saline) or nasal saline lavage (i.e., NeilMed) is recommended as needed and prior to medicated nasal sprays. Continue epinastine eye drops 1 drop in each eye twice a day as needed for red, itchy eyes. Asthma has to  be in better control before starting allergy shots.   Allergic conjunctivitis of both eyes See assessment and plan as above.  Other atopic dermatitis Controlled. Continue a daily moisturizing routine. Continue desonide 0.05% ointment to red, itchy areas twice a day as needed. Do not use this medication longer than 3 weeks in a row.   Gastroesophageal reflux disease Continue to follow up with your gastroenterologist for evaluation and treatment of GI issues.  Return in about 4 weeks (around 01/26/2022).  Meds ordered this encounter  Medications   albuterol (PROVENTIL) (2.5 MG/3ML) 0.083% nebulizer solution    Sig: Take 3 mLs (2.5 mg total) by nebulization every 4 (four) hours as needed for wheezing or shortness of breath (coughing fits).    Dispense:  75 mL    Refill:  1   budesonide (PULMICORT) 0.5 MG/2ML nebulizer solution    Sig: Take 2 mLs (0.5 mg total) by nebulization in the morning and at bedtime. For the next 2 weeks and use during asthma flares.    Dispense:  120 mL    Refill:  2   tezepelumab-ekko (TEZSPIRE) 210 MG/1. syringe 210 mg   Lab Orders  No laboratory test(s) ordered today    Diagnostics: None.   Medication List:  Current Outpatient Medications  Medication Sig Dispense Refill   albuterol (PROVENTIL) (2.5 MG/3ML) 0.083% nebulizer solution Take 3 mLs (2.5 mg total) by nebulization every 4 (four) hours as needed for wheezing or shortness of breath (coughing fits). 75 mL 1   albuterol (VENTOLIN HFA) 108 (90 Base) MCG/ACT inhaler INHALE 2 PUFFS INTO THE LUNGS EVERY 4 HOURS AS NEEDED FOR WHEEZE OR FOR SHORTNESS OF BREATH 18 each 0   AMBULATORY NON FORMULARY MEDICATION Apply 1 application topically at bedtime. Medication Name: Compounded Hydroquinone 8%, Tretinoin 0.025%, Kojic Acid 1%, Niacinamide 4%, Fluocinolone 0.025% Cream (Skin Medicinals) 30 g 0   azelastine (ASTELIN) 0.1 % nasal spray Place 2 sprays into both nostrils 2 (two) times daily as needed for  rhinitis. 30 mL 5   baclofen (LIORESAL) 10 MG tablet Take 10 mg by mouth 3 (three) times daily.     benzonatate (TESSALON) 100 MG capsule Take 2 capsules (200 mg total) by mouth 3 (three) times daily as needed for cough. 90 capsule 0   budesonide (PULMICORT) 0.5 MG/2ML nebulizer solution Take 2 mLs (0.5 mg total) by nebulization in the morning and at bedtime. For the next 2 weeks and use during asthma flares. 120 mL 2   budesonide-formoterol (SYMBICORT) 160-4.5 MCG/ACT inhaler INHALE 2 PUFFS INTO THE LUNGS TWICE A DAY 10.2 g 5   cyclobenzaprine (FLEXERIL) 5 MG tablet Take 5 mg by mouth 3 (three) times daily as needed for muscle spasms. At night time     desonide (DESOWEN) 0.05 % ointment APPLY 1 APPLICATION TOPICALLY 2 TIMES DAILY AS NEEDED TO RED ITCHY AREAS TO THE FACE. 45 g 0   DULoxetine (CYMBALTA) 60 MG capsule Take 60 mg by mouth daily.     Epinastine HCl 0.05 % ophthalmic solution Apply to eye as needed.     famotidine (PEPCID) 20 MG tablet TAKE 1 TABLET BY MOUTH TWICE A DAY 180  tablet 0   flunisolide (NASALIDE) 25 MCG/ACT (0.025%) SOLN 2 SPRAYS PER NOSTRIL 2-3 TIMES PER DAY AS NEEDED FOR STUFFY NOSE. 75 mL 1   gabapentin (NEURONTIN) 100 MG capsule Take 1 capsule (100 mg total) by mouth 3 (three) times daily. (Patient taking differently: Take 100 mg by mouth as needed.) 90 capsule 3   hydrOXYzine (ATARAX/VISTARIL) 25 MG tablet Take 1 tablet (25 mg total) by mouth 3 (three) times daily as needed for itching. 60 tablet 0   meloxicam (MOBIC) 15 MG tablet Take 15 mg by mouth daily.     montelukast (SINGULAIR) 10 MG tablet TAKE 1 TABLET BY MOUTH EVERYDAY AT BEDTIME 90 tablet 0   ondansetron (ZOFRAN) 4 MG tablet Take 1 tablet (4 mg total) by mouth every 8 (eight) hours as needed. 20 tablet 0   pantoprazole (PROTONIX) 40 MG tablet Take 1 tablet (40 mg total) by mouth daily. 30 tablet 6   phenazopyridine (PYRIDIUM) 100 MG tablet Take 1 tablet (100 mg total) by mouth 3 (three) times daily as needed  for pain. 6 tablet 0   polyethylene glycol powder (GLYCOLAX/MIRALAX) 17 GM/SCOOP powder Take 17g daily 255 g 3   pregabalin (LYRICA) 150 MG capsule Take 150 mg by mouth 2 (two) times daily.     Tiotropium Bromide Monohydrate (SPIRIVA RESPIMAT) 1.25 MCG/ACT AERS Inhale 2 puffs once a day to control asthma 4 g 5   Current Facility-Administered Medications  Medication Dose Route Frequency Provider Last Rate Last Admin   tezepelumab-ekko (TEZSPIRE) 210 MG/1.91ML syringe 210 mg  210 mg Subcutaneous Q28 days Garnet Sierras, DO   210 mg at 12/29/21 1142   Allergies: Allergies  Allergen Reactions   Rocephin [Ceftriaxone Sodium In Dextrose] Hives   I reviewed her past medical history, social history, family history, and environmental history and no significant changes have been reported from her previous visit.  Review of Systems  Constitutional:  Negative for appetite change, chills, fever and unexpected weight change.  HENT:  Positive for congestion. Negative for rhinorrhea.   Eyes:  Positive for itching.  Respiratory:  Positive for cough, shortness of breath and wheezing. Negative for chest tightness.   Gastrointestinal:  Negative for abdominal pain.  Skin:  Negative for rash.  Allergic/Immunologic: Positive for environmental allergies.  Neurological:  Negative for headaches.   Objective: BP 106/70 (BP Location: Right Arm, Patient Position: Sitting, Cuff Size: Normal)    Pulse 86    Temp 98.1 F (36.7 C) (Temporal)    Resp 16    SpO2 100%  There is no height or weight on file to calculate BMI. Physical Exam Vitals and nursing note reviewed.  Constitutional:      Appearance: Normal appearance. She is well-developed.  HENT:     Head: Normocephalic and atraumatic.     Right Ear: Tympanic membrane and external ear normal.     Left Ear: Tympanic membrane and external ear normal.     Nose: Nose normal.     Mouth/Throat:     Mouth: Mucous membranes are moist.     Pharynx: Oropharynx is  clear.  Eyes:     Conjunctiva/sclera: Conjunctivae normal.  Cardiovascular:     Rate and Rhythm: Normal rate and regular rhythm.     Heart sounds: Normal heart sounds. No murmur heard. Pulmonary:     Effort: Pulmonary effort is normal.     Breath sounds: Normal breath sounds. No wheezing, rhonchi or rales.  Musculoskeletal:     Cervical back:  Neck supple.  Skin:    General: Skin is warm.     Findings: No rash.  Neurological:     Mental Status: She is alert and oriented to person, place, and time.  Psychiatric:        Behavior: Behavior normal.  Previous notes and tests were reviewed. The plan was reviewed with the patient/family, and all questions/concerned were addressed.  It was my pleasure to see Zeruiah today and participate in her care. Please feel free to contact me with any questions or concerns.  Sincerely,  Rexene Alberts, DO Allergy & Immunology  [facility] office: [place] office: [phone]

## 2021-12-29 ENCOUNTER — Encounter: Payer: Self-pay | Admitting: Allergy

## 2021-12-29 ENCOUNTER — Other Ambulatory Visit: Payer: Self-pay

## 2021-12-29 ENCOUNTER — Ambulatory Visit (INDEPENDENT_AMBULATORY_CARE_PROVIDER_SITE_OTHER): Payer: 59 | Admitting: Allergy

## 2021-12-29 VITALS — BP 106/70 | HR 86 | Temp 98.1°F | Resp 16

## 2021-12-29 DIAGNOSIS — H1013 Acute atopic conjunctivitis, bilateral: Secondary | ICD-10-CM

## 2021-12-29 DIAGNOSIS — J301 Allergic rhinitis due to pollen: Secondary | ICD-10-CM | POA: Diagnosis not present

## 2021-12-29 DIAGNOSIS — J4550 Severe persistent asthma, uncomplicated: Secondary | ICD-10-CM | POA: Insufficient documentation

## 2021-12-29 DIAGNOSIS — L2089 Other atopic dermatitis: Secondary | ICD-10-CM

## 2021-12-29 DIAGNOSIS — K219 Gastro-esophageal reflux disease without esophagitis: Secondary | ICD-10-CM

## 2021-12-29 MED ORDER — ALBUTEROL SULFATE (2.5 MG/3ML) 0.083% IN NEBU: 2.5000 mg | INHALATION_SOLUTION | RESPIRATORY_TRACT | 1 refills | Status: AC | PRN

## 2021-12-29 MED ORDER — BUDESONIDE 0.5 MG/2ML IN SUSP: 0.5000 mg | Freq: Two times a day (BID) | RESPIRATORY_TRACT | 2 refills | Status: DC

## 2021-12-29 MED ORDER — TEZEPELUMAB-EKKO 210 MG/1.91ML ~~LOC~~ SOSY
210.0000 mg | PREFILLED_SYRINGE | SUBCUTANEOUS | Status: DC
  Administered 2021-12-29 – 2022-09-08 (×10): 210 mg via SUBCUTANEOUS

## 2021-12-29 NOTE — Assessment & Plan Note (Signed)
.   See assessment and plan as above. 

## 2021-12-29 NOTE — Patient Instructions (Addendum)
Asthma ?Tezspire injection given today and will continue every 4 weeks injection in the office. ?  ?Daily controller medication(s): Symbicort 2 puffs twice a day with spacer and rinse mouth afterwards. ?Spiriva 2 puffs once a day. ?Continue Singulair (montelukast) 10mg  daily at night. ?Nebulizer machine given in the office today ?During upper respiratory infections/flares:  ?Start Pulmicort nebulizer twice a day for 1-2 weeks until your breathing symptoms return to baseline.  ?Pretreat with albuterol 2 puffs or albuterol nebulizer.  ?If you need to use your albuterol nebulizer machine back to back within 15-30 minutes with no relief then please go to the ER/urgent care for further evaluation.  ?May use albuterol rescue inhaler 2 puffs or nebulizer every 4 to 6 hours as needed for shortness of breath, chest tightness, coughing, and wheezing. May use albuterol rescue inhaler 2 puffs 5 to 15 minutes prior to strenuous physical activities. Monitor frequency of use.  ?Asthma control goals:  ?Full participation in all desired activities (may need albuterol before activity) ?Albuterol use two times or less a week on average (not counting use with activity) ?Cough interfering with sleep two times or less a month ?Oral steroids no more than once a year ?No hospitalizations  ? ?Allergic rhinitis ?2021 skin testing was positive to grass, ragweed, weed. ?Continue environmental control measures as below.  ?Take Xyzal 5 mg once a day as needed for a runny nose or itch. May take twice a day during flares. ?Use azelastine nasal spray 1-2 sprays per nostril twice a day as needed for runny nose/drainage. ?Use flunisolide nasal spray 1-2 sprays per nostril 2-3 times a day as needed for nasal congestion.  ?Nasal saline spray (i.e., Simply Saline) or nasal saline lavage (i.e., NeilMed) is recommended as needed and prior to medicated nasal sprays. ? ?Asthma has to be in better control before starting allergy shots.  ?Continue  epinastine eye drops 1 drop in each eye twice a day as needed for red, itchy eyes. ? ?Atopic dermatitis ?Continue a daily moisturizing routine ?Continue desonide 0.05% ointment to red, itchy areas twice a day as needed. Do not use this medication longer than 3 weeks in a row.  ? ?Reflux ?Continue to follow up with your gastroenterologist for evaluation and treatment of GI issues ? ?Follow up in 1 month or sooner if needed. ? ?Reducing Pollen Exposure ?Pollen seasons: trees (spring), grass (summer) and ragweed/weeds (fall). ?Keep windows closed in your home and car to lower pollen exposure.  ?Install air conditioning in the bedroom and throughout the house if possible.  ?Avoid going out in dry windy days - especially early morning. ?Pollen counts are highest between 5 - 10 AM and on dry, hot and windy days.  ?Save outside activities for late afternoon or after a heavy rain, when pollen levels are lower.  ?Avoid mowing of grass if you have grass pollen allergy. ?Be aware that pollen can also be transported indoors on people and pets.  ?Dry your clothes in an automatic dryer rather than hanging them outside where they might collect pollen.  ?Rinse hair and eyes before bedtime. ? ?

## 2021-12-29 NOTE — Assessment & Plan Note (Signed)
Past history - 2021 skin testing was positive to grass, ragweed, weed. ?Interim history - still having some nasal symptoms.  ?? Continue environmental control measures as below.  ?? Take Xyzal 5 mg once a day as needed for a runny nose or itch. May take twice a day during flares. ?? Use azelastine nasal spray 1-2 sprays per nostril twice a day as needed for runny nose/drainage. ?? Use flunisolide nasal spray 1-2 sprays per nostril 2-3 times a day as needed for nasal congestion.  ?? Nasal saline spray (i.e., Simply Saline) or nasal saline lavage (i.e., NeilMed) is recommended as needed and prior to medicated nasal sprays. ?? Continue epinastine eye drops 1 drop in each eye twice a day as needed for red, itchy eyes. ?? Asthma has to be in better control before starting allergy shots.  ?

## 2021-12-29 NOTE — Assessment & Plan Note (Signed)
Controlled. ?? Continue a daily moisturizing routine. ?? Continue desonide 0.05% ointment to red, itchy areas twice a day as needed. Do not use this medication longer than 3 weeks in a row.  ?

## 2021-12-29 NOTE — Assessment & Plan Note (Signed)
?   Continue to follow up with your gastroenterologist for evaluation and treatment of GI issues. ?

## 2021-12-29 NOTE — Assessment & Plan Note (Signed)
Persistent symptoms with coughing, mucous. Mild benefit with systemic steroids but does not like to take. Alvesco showed no benefit. Last CXR in November 2022. No prior biologics.  ?? Sample Tezspire injection given today and will continue every 4 weeks injection in the office. ?? Daily controller medication(s): Symbicort 2 puffs twice a day with spacer and rinse mouth afterwards. ?o Spiriva 2 puffs once a day. ?o Continue Singulair (montelukast) 10mg  daily at night. ?? Nebulizer machine given in the office today and demonstrated proper use.  ?? During upper respiratory infections/flares:  ?o Start Pulmicort nebulizer twice a day for 1-2 weeks until your breathing symptoms return to baseline.  ?o Pretreat with albuterol 2 puffs or albuterol nebulizer.  ?o If you need to use your albuterol nebulizer machine back to back within 15-30 minutes with no relief then please go to the ER/urgent care for further evaluation.  ?? May use albuterol rescue inhaler 2 puffs or nebulizer every 4 to 6 hours as needed for shortness of breath, chest tightness, coughing, and wheezing. May use albuterol rescue inhaler 2 puffs 5 to 15 minutes prior to strenuous physical activities. Monitor frequency of use.  ?? Get spirometry at next visit. ?? If no improvement, will get CXR next and possible cardiac work up. ?

## 2021-12-30 ENCOUNTER — Other Ambulatory Visit: Payer: Self-pay | Admitting: Family Medicine

## 2022-01-01 ENCOUNTER — Encounter: Payer: Self-pay | Admitting: Dermatology

## 2022-01-01 NOTE — Progress Notes (Signed)
? ?  Follow-Up Visit ?  ?Subjective  ?Laura Fields is a 61 y.o. female who presents for the following: Procedure (Here for cyst removal on her posterior neck. ). ? ?Cyst back of neck which recurrently rubs and causes irritation.  Patient requests removal. ?Location:  ?Duration:  ?Quality:  ?Associated Signs/Symptoms: ?Modifying Factors:  ?Severity:  ?Timing: ?Context:  ? ?Objective  ?Well appearing patient in no apparent distress; mood and affect are within normal limits. ?Neck - Posterior ?Slightly fibrotic but not inflamed ovoid 1.8 cm deep dermal nodule compatible with epidermoid cyst.  Patient requests removal.  Told preoperatively risks of unsightly scar, recurrence, infection. ? ? ? ?A focused examination was performed including upper back, neck, scalp. Relevant physical exam findings are noted in the Assessment and Plan. ? ? ?Assessment & Plan  ? ? ?Epidermoid cyst of skin ?Neck - Posterior ? ?Skin excision - Neck - Posterior ? ?Lesion length (cm):  1.5 ?Lesion width (cm):  1.5 ?Margin per side (cm):  0 ?Total excision diameter (cm):  1.5 ?Informed consent: discussed and consent obtained   ?Timeout: patient name, date of birth, surgical site, and procedure verified   ?Anesthesia: the lesion was anesthetized in a standard fashion   ?Anesthetic:  1% lidocaine w/ epinephrine 1-100,000 local infiltration ?Instrument used: #15 blade   ?Hemostasis achieved with: pressure and electrodesiccation   ?Outcome: patient tolerated procedure well with no complications   ?Post-procedure details: sterile dressing applied and wound care instructions given   ?Dressing type: bandage, petrolatum and pressure dressing   ?Additional details:  4-0 vic x1 ?4-0 eth x2  ? ?Specimen 1 - Surgical pathology ?Differential Diagnosis: cyst exc  ? ?Check Margins: No ? ? ? ? ? ?I, Lavonna Monarch, MD, have reviewed all documentation for this visit.  The documentation on 01/01/22 for the exam, diagnosis, procedures, and orders are all accurate  and complete. ?

## 2022-01-03 ENCOUNTER — Ambulatory Visit (INDEPENDENT_AMBULATORY_CARE_PROVIDER_SITE_OTHER): Payer: 59 | Admitting: Rehabilitative and Restorative Service Providers"

## 2022-01-03 ENCOUNTER — Other Ambulatory Visit: Payer: Self-pay

## 2022-01-03 ENCOUNTER — Other Ambulatory Visit: Payer: Self-pay | Admitting: Family Medicine

## 2022-01-03 DIAGNOSIS — M25532 Pain in left wrist: Secondary | ICD-10-CM | POA: Diagnosis not present

## 2022-01-03 DIAGNOSIS — M6281 Muscle weakness (generalized): Secondary | ICD-10-CM

## 2022-01-03 DIAGNOSIS — M25642 Stiffness of left hand, not elsewhere classified: Secondary | ICD-10-CM

## 2022-01-03 NOTE — Therapy (Signed)
OUTPATIENT OCCUPATIONAL THERAPY TREATMENT NOTE   Patient Name: Laura Fields MRN: [id number] DOB:1961-08-07, 61 y.o., female Today's Date: 01/03/2022  PCP: Olive Bass, FNP REFERRING PROVIDER: Eldred Manges, MD   OT End of Session - 01/03/22 1023     Visit Number 4    Number of Visits 8    Date for OT Re-Evaluation 01/07/22    OT Start Time 1024    OT Stop Time 1058    OT Time Calculation (min) 34 min    Equipment Utilized During Treatment orthotic materials    Activity Tolerance Patient tolerated treatment well;No increased pain;Patient limited by pain    Behavior During Therapy [facility] for tasks assessed/performed               Past Medical History:  Diagnosis Date   Allergy    Anxiety    Arthritis    Asthma    Depression    Fibromyalgia    Intrinsic atopic dermatitis 12/01/2020   Osteoporosis    Raynaud's disease    Sciatica    Past Surgical History:  Procedure Laterality Date   ABDOMINAL HYSTERECTOMY     ACHILLES TENDON REPAIR Right 07/02/2021   AUGMENTATION MAMMAPLASTY Bilateral 1999   Saline/ under muscle   back nerve     block   COLONOSCOPY  09/23/2013   [facility]   colonoscopy with endo  11/30/2021   CYST REMOVAL NECK Left 12/23/2021   GASTRIC BYPASS     KNEE ARTHROSCOPY     mass removable     on thumb   STOMACH SURGERY     Patient Active Problem List   Diagnosis Date Noted   Not well controlled severe persistent asthma 12/29/2021   Seasonal allergic rhinitis due to pollen 12/29/2021   Hand cramps 11/26/2021   Left hand weakness 10/11/2021   Numbness and tingling in left hand 09/02/2021   Gastroesophageal reflux disease 07/28/2021   Other atopic dermatitis 12/01/2020   Dysfunction of both eustachian tubes 12/01/2020   Pruritic rash 02/12/2020   Allergic conjunctivitis of both eyes 02/12/2020   Seasonal allergic rhinitis with a nonallergic component 12/25/2019    ONSET DATE: Chronic issues since 1996, per pt report    REFERRING DIAG:  R25.2 (ICD-10-CM) - Hand cramps  R29.898 (ICD-10-CM) - Left hand weakness  R20.0,R20.2 (ICD-10-CM) - Numbness and tingling in left hand  M79.642 (ICD-10-CM) - Pain in left hand    THERAPY DIAG:  Pain in left wrist  Muscle weakness (generalized)  Stiffness of left hand, not elsewhere classified   PERTINENT HISTORY:  Currently PT for Rt ankle pain;  Per hand surgeon evaluation on 11/26/21: "61 year old right-hand-dominant female who presents for follow-up of left hand weakness.  She describes pain in her distal forearm and hand that is intermittent and is associated with "locking up" of her index and middle fingers.  This happens randomly when she is doing activity such as cooking cleaning or otherwise using her hands.  There are no certain activities that bring this on.Marland KitchenMarland KitchenHer exam today is totally normal.  We discussed the nature of hand cramps and preventive strategies such as staying hydrated and stretching the hand intermittently during activities." PmHx significant for fibromyalgia, OA, osteoporosis, low back pain, past spinal ablation, muscle spasms, and other serious issues.   PRECAUTIONS: none  SUBJECTIVE:  She arrives late today, states that she is in more pain now due to cold weather's return. She does state wearing orthotic at night which is helping night paresthesia.  PAIN:  Are you having pain? Yes NPRS scale: 7/10 Pain location: wrist, volar and in ulnar TFCC area today as well  Pain orientation: Left  PAIN TYPE: aching Pain description: intermittent  Aggravating factors: overuse Relieving factors: rest, heat    OBJECTIVE:  (All from eval 12/09/21 unless otherwise specified)  DIAGNOSTIC FINDINGS: Imaging from Oct '22: "Imaging: 3 views of bilateral wrist taken today reviewed interpreted by me.  They demonstrate no acute bony injury.  There is no evidence of instability or dislocation.  There is no evidence of significant degenerative changes."    ADLs: Overall ADLs: Pt states having significant difficulties with using containers, holding/grasping objects, using silverware, sleep numbness and disturbance, etc.    FUNCTIONAL OUTCOME MEASURES: Quick Dash 45% today impairment      SENSATION: Light touch: Impaired: decreased in ulnar & median nerve dist as seen by [facility]  Stereognosis: Appears intact Hot/Cold: Appears intact Proprioception: Appears intact Semmes Weinstein Monofilament scale:Lt hand:  Diminished protective (3.84 - 4.31) @ thumb; Diminished light touch (3.61) @ IF & SF; "normal" (2.83) radial side of ring finger.   These results are fairly contradictory in median nerve reports.    COORDINATION: 9 Hole Peg test: Right:  sec; Left:  sec Box and Blocks: Comments: Will be assessed as needed, but has full fist and full opposition despite long (1") fingernails today      EDEMA: Pt has some "bulging" at over Lt flexor retinaculum with wrist ext, but circumferential measures are equal Lt to Rt wrists (15.5cm)    MUSCLE TONE: WNL, no twitching or cramping noted today    SKIN INTEGRITY: no issues or breakdown    PALPATION: Pt reports TTP over flexor retinaculum, but no soreness at thumb CMC, MPJs, etc.    UE AROM/PROM: typical/normal from forearms and proximal b/l   A/PROM Right 12/09/2021 Left 12/09/2021  Wrist flexion 45 26 (states pain)  Wrist extension 75 70  Wrist ulnar deviation   46  Wrist radial deviation   20  (Blank rows = not tested)   HAND A/PROM: Can make full fist b/l and fully oppose b/l, no pain or c/o      UE MMT:   MMT Left 12/09/2021  Elbow flexion    Elbow extension    Wrist flexion 4-/5 reports no pain  Wrist extension 4-/5 reports pain  Wrist ulnar deviation 4+/5  Wrist radial deviation 4+/5   (Blank rows = not tested)   HAND FUNCTION:   Grip strength: Right: 37 lbs; Left: 13 lbs (states pain)    Comments: strangely very weak b/l (could be linked to tight flexion b/l), try  5-position grip testing to check for full effort in upcoming sessions    TODAY'S TREATMENT:  01/03/22: OT does manual stretches to wrist and she c/o ulnar pain somewhat like TFCC issues. With a circumferential pressure around distal wrist, she states relief of symptoms, so OT makes TFCC wrist strap orthotic for her and she says "I love it" her pain is much improved. OT also reviews nerve glides, she performs. OT also edu on light isometric squeezing and she tolerates well, like a stretch.   12/22/21: While pt is on MH for 5 mins, OT adjusts orthotic straps and sizing, which fits comfortably when finished. OT reviews HEP expectations with pt after that & performs manual therapy- stretches with IASTM over volar FA, wrist and palm areas noting rough areas of fascia and muscle spasms in flexor groups near elbow. OT applies some  manual stretches with light traction as well. She performs HEP back to OT for review, then  OT educates on new HEP for  nerve glides to help with any nerve compression. She tolerates these well and states feeling slightly sore in FA after IASTM, so OT recommends to go home and use cold pack on arm.   Exercises Seated Wrist Flexion Stretch - 3-4 x daily - 3-5 reps - 15 hold Wrist Prayer Stretch - 2-3 x daily - 3-5 reps - 15 sec hold Tendon Glides - 3-4 x daily - 5- 10 reps - 2-3 seconds hold Median Nerve Flossing - 2-3 x daily - 1 sets - 5 reps - slowly hold Seated Median Nerve Glide - 2-3 x daily - 1 sets - 5 reps - slowly hold   PATIENT EDUCATION: Education details: See tx section above for details Person educated: Patient Education method: Explanation, Demonstration, and Handouts Education comprehension: verbalized understanding, returned demonstration, and needs further education     HOME EXERCISE PROGRAM: Access Code: [email or web address] URL: [email or web address] Prepared by: Fannie KneeNathanael Trulee Hamstra   ASSESSMENT:   CLINICAL IMPRESSION: 01/03/22: Pt learning to deal with  chronic conditions and manage as well as she can.   12/22/21: Pt has management to help with nerves, stiffness in hand/wrist now- needs to get consistent. Is complicated by many other comorbidities    GOALS: Goals reviewed with patient? No   SHORT TERM GOALS: (STG required if POC>30 days)   LONG TERM GOALS:    LTG Name Target Date Goal status  1 Pt will decrease impairment per Quick DASH from 45% to 20% or better for increased quality of life 01/07/22 INITIAL  2 Pt will increase Lt wrist flexion A/ROM from 26* to at least 45* for better flexibility to grasp objects  01/07/22 INITIAL  3 Pt will increase Lt grip strength from 13# painfully to at least 25# for BADL home use  01/07/22 INITIAL  4 Pt will demo or state at least 5 coping mechanisms to deal with chronic pain, tightness, numbness, etc. (Examples: better postures/sleep postures, stretches, light isometric strength, activity modifications, energy conservation, built up handles and A/E, taking appropriate meds, decrease anxiety, etc.)  01/07/22 INITIAL    PLAN: OT FREQUENCY: 2x/week   OT DURATION: 4 weeks   PLANNED INTERVENTIONS: self care/ADL training, therapeutic exercise, therapeutic activity, neuromuscular re-education, manual therapy, passive range of motion, splinting, electrical stimulation, ultrasound, fluidotherapy, compression bandaging, moist heat, cryotherapy, contrast bath, patient/family education, psychosocial skills training, energy conservation, coping strategies training, and DME and/or AE instructions   PLAN FOR NEXT SESSION:  Reassess in next 1-2 visits (before 01/07/22) per POC. Due to community transportation policy changing, she won't have a ride to therapy by the end of the month, so she will need to finish before then, per self-report.  Also can try arm bike and PRE (eccentrics) if tolerated to increase blood flow and work on fascial gliding, as this wasn't done today due to late start.     RECOMMENDED OTHER  SERVICES: She is already followed by pain mgmt clinic, she may benefit from mental health counseling due to stress and being on meds for anxiety, etc. Also would benefit from comprehensive med review with clinical pharmacist or holistic MD.    CONSULTED AND AGREED WITH PLAN OF CARE: Patient       Fannie Kneeathanael Shaiden Aldous, OTR/L, CHT 01/03/2022, 12:33 PM

## 2022-01-04 ENCOUNTER — Ambulatory Visit (INDEPENDENT_AMBULATORY_CARE_PROVIDER_SITE_OTHER): Payer: 59 | Admitting: Podiatry

## 2022-01-04 ENCOUNTER — Encounter: Payer: Self-pay | Admitting: Podiatry

## 2022-01-04 DIAGNOSIS — Z9889 Other specified postprocedural states: Secondary | ICD-10-CM

## 2022-01-04 DIAGNOSIS — M7661 Achilles tendinitis, right leg: Secondary | ICD-10-CM

## 2022-01-04 MED ORDER — GABAPENTIN 100 MG PO CAPS: 100.0000 mg | ORAL_CAPSULE | Freq: Three times a day (TID) | ORAL | 3 refills | Status: DC

## 2022-01-04 NOTE — Progress Notes (Signed)
She presents today date of surgery July 02, 2021 gastroc recession Achilles tenolysis spur resection.  She states that it still swells she states when she is back and heels it feels much better but is black and blue still tender.  She had to discontinue water therapy because she had a cyst removed from her neck.  So at this point she is going start back water therapy and hopefully that will continue to strengthen her foot and leg. ? ?Objective: Vital signs are stable alert and oriented x3 she still has some allodynia type pain but otherwise she has good plantarflexion against resistance and no tenderness there.  She has good dorsiflexion. ? ?Assessment: Well-healing surgical foot with minor setback possibly secondary to tear of some scar tissue. ? ?Plan: At this point I am going to request that she get back into aqua therapy follow-up with me in 1 to 2 months. ?

## 2022-01-05 ENCOUNTER — Ambulatory Visit (INDEPENDENT_AMBULATORY_CARE_PROVIDER_SITE_OTHER): Payer: 59 | Admitting: Rehabilitative and Restorative Service Providers"

## 2022-01-05 ENCOUNTER — Other Ambulatory Visit: Payer: Self-pay

## 2022-01-05 DIAGNOSIS — M6281 Muscle weakness (generalized): Secondary | ICD-10-CM

## 2022-01-05 DIAGNOSIS — M25642 Stiffness of left hand, not elsewhere classified: Secondary | ICD-10-CM

## 2022-01-05 DIAGNOSIS — M25532 Pain in left wrist: Secondary | ICD-10-CM | POA: Diagnosis not present

## 2022-01-05 NOTE — Therapy (Signed)
?OUTPATIENT OCCUPATIONAL THERAPY TREATMENT & DISCHARGE NOTE ? ? ?Patient Name: Laura Fields ?MRN: [id number] ?DOB:[birth year], 61 y.o., female ?Today's Date: 01/05/2022 ? ?PCP: Laura Fields, [place] ?REFERRING PROVIDER: Garrel Fields, DPM ? ? OT End of Session - 01/05/22 1015   ? ? Visit Number 5   ? Number of Visits 8   ? Date for OT Re-Evaluation 01/07/22   ? OT Start Time 1015   ? OT Stop Time 1047   ? OT Time Calculation (min) 32 min   ? Activity Tolerance Patient tolerated treatment well;No increased pain;Patient limited by pain   ? Behavior During Therapy [facility] for tasks assessed/performed   ? ?  ?  ? ?  ? ? ? ? ? ?Past Medical History:  ?Diagnosis Date  ? Allergy   ? Anxiety   ? Arthritis   ? Asthma   ? Depression   ? Fibromyalgia   ? Intrinsic atopic dermatitis 12/01/2020  ? Osteoporosis   ? Raynaud's disease   ? Sciatica   ? ?Past Surgical History:  ?Procedure Laterality Date  ? ABDOMINAL HYSTERECTOMY    ? ACHILLES TENDON REPAIR Right 07/02/2021  ? AUGMENTATION MAMMAPLASTY Bilateral 1999  ? Saline/ under muscle  ? back nerve    ? block  ? COLONOSCOPY  09/23/2013  ? [facility]  ? colonoscopy with endo  11/30/2021  ? CYST REMOVAL NECK Left 12/23/2021  ? GASTRIC BYPASS    ? KNEE ARTHROSCOPY    ? mass removable    ? on thumb  ? STOMACH SURGERY    ? ?Patient Active Problem List  ? Diagnosis Date Noted  ? Not well controlled severe persistent asthma 12/29/2021  ? Seasonal allergic rhinitis due to pollen 12/29/2021  ? Hand cramps 11/26/2021  ? Left hand weakness 10/11/2021  ? Numbness and tingling in left hand 09/02/2021  ? Gastroesophageal reflux disease 07/28/2021  ? Other atopic dermatitis 12/01/2020  ? Dysfunction of both eustachian tubes 12/01/2020  ? Pruritic rash 02/12/2020  ? Allergic conjunctivitis of both eyes 02/12/2020  ? Seasonal allergic rhinitis with a nonallergic component 12/25/2019  ? ? ?ONSET DATE: Chronic issues since 1996, per pt report ?  ?REFERRING DIAG:  ?R25.2 (ICD-10-CM) - Hand cramps   ?R29.898 (ICD-10-CM) - Left hand weakness  ?R20.0,R20.2 (ICD-10-CM) - Numbness and tingling in left hand  ?G81.856 (ICD-10-CM) - Pain in left hand  ? ? ?THERAPY DIAG:  ?Pain in left wrist ? ?Muscle weakness (generalized) ? ?Stiffness of left hand, not elsewhere classified ? ? ?PERTINENT HISTORY:  ?Currently PT for Rt ankle pain;  Per hand surgeon evaluation on 11/26/21: "62 year old right-hand-dominant female who presents for follow-up of left hand weakness.  She describes pain in her distal forearm and hand that is intermittent and is associated with "locking up" of her index and middle fingers.  This happens randomly when she is doing activity such as cooking cleaning or otherwise using her hands.  There are no certain activities that bring this on.Marland KitchenMarland KitchenHer exam today is totally normal.  We discussed the nature of hand cramps and preventive strategies such as staying hydrated and stretching the hand intermittently during activities." PmHx significant for fibromyalgia, OA, osteoporosis, low back pain, past spinal ablation, muscle spasms, and other serious issues.  ? ?PRECAUTIONS: none ? ?SUBJECTIVE:  ?She states feeling better since starting therapy, stretches helping and she feels looser. She describes doing cooking tasks at home with only minimal problems. She states ordering a wrist brace and wrist widget as well,  since the orthotic wrist strap has been helping her. She states she's learned how to manage her problems better.  ? ?PAIN:  ?Are you having pain? Yes ?NPRS scale: 4/10 ?Pain location: wrist, volar and in ulnar TFCC area today as well  ?Pain orientation: Left  ?PAIN TYPE: aching ?Pain description: intermittent  ?Aggravating factors: overuse ?Relieving factors: rest, heat ? ? ?OBJECTIVE:  (All from eval 12/09/21 unless otherwise specified) ? ?DIAGNOSTIC FINDINGS: Imaging from Oct '22: "Imaging: ?3 views of bilateral wrist taken today reviewed interpreted by me.  They demonstrate no acute bony injury.  There  is no evidence of instability or dislocation.  There is no evidence of significant degenerative changes." ?  ?ADLs: ?Overall ADLs: Pt states having significant difficulties with using containers, holding/grasping objects, using silverware, sleep numbness and disturbance, etc.  ?  ?FUNCTIONAL OUTCOME MEASURES: ?Quick Dash 45% today impairment  ?  ?01/05/22: Laura Fields 66% today impairment  ?  ?SENSATION: ?Light touch: Impaired: decreased in ulnar & median nerve dist as seen by [facility]  ?Stereognosis: Appears intact ?Hot/Cold: Appears intact ?Proprioception: Appears intact ?Semmes Weinstein Monofilament scale:Lt hand:  Diminished protective (3.84 - 4.31) @ thumb; Diminished light touch (3.61) @ IF & SF; "normal" (2.83) radial side of ring finger.   ?These results are fairly contradictory in median nerve reports.  ?  ?01/05/22: 2.83 (typical) in entire volar Left hand now  ? ? ?COORDINATION: ?9 Hole Peg test: Right:  sec; Left:  sec ?Box and Blocks: ?Comments: Will be assessed as needed, but has full fist and full opposition despite long (1") fingernails today  ?  ?  ?EDEMA: Pt has some "bulging" at over Lt flexor retinaculum with wrist ext, but circumferential measures are equal Lt to Rt wrists (15.5cm)  ?  ?MUSCLE TONE: WNL, no twitching or cramping noted today  ?  ?SKIN INTEGRITY: no issues or breakdown  ?  ?PALPATION: Pt reports TTP over flexor retinaculum, but no soreness at thumb CMC, MPJs, etc.  ?  ?UE AROM/PROM: typical/normal from forearms and proximal b/l ?  ?A/ROM Right ?12/09/2021 Left ?12/09/2021 Left ?01/05/22  ?Wrist flexion 45 26 (states pain) 47  ?Wrist extension 75 70 55  ?Wrist ulnar deviation   46 44  ?Wrist radial deviation   20 5  ?(Blank rows = not tested) ?  ?HAND A/PROM: Can make full fist b/l and fully oppose b/l, no pain or c/o  ?  ?  ?UE MMT: ?  ?MMT Left ?12/09/2021  ?Elbow flexion    ?Elbow extension    ?Wrist flexion 4-/5 reports no pain  ?Wrist extension 4-/5 reports pain  ?Wrist ulnar deviation  4+/5  ?Wrist radial deviation 4+/5   ?(Blank rows = not tested) ?  ?HAND FUNCTION: ?  ?Grip strength: Right: 37 lbs; Left: 13 lbs (states pain)  ?01/05/22: Grip strength: Right: 10 lbs; Left: 8 lbs  ? ?Comments: strangely very weak b/l (could be linked to tight flexion b/l), try 5-position grip testing to check for full effort in upcoming sessions  ?  ?TODAY'S TREATMENT:  ?01/05/22 Pt performs A/ROM, gripping, and discussion of home care and self-care for progress/d/c check and has some mixed results. OT reviews newer HEP nerve glides and wrist stretches, orthotic and brace usage and management of daily schedule and energy conservation. OT also provides built up foam to help with utensil use as she states this is not a moderate to severe problem at times. She states understanding how to continue on with her own  self-management at home.  ? ?  ?PATIENT EDUCATION: ?Education details: See tx section above for details ?Person educated: Patient ?Education method: Explanation, Demonstration, and Handouts ?Education comprehension: verbalized understanding, returned demonstration, and needs further education ?  ?  ?HOME EXERCISE PROGRAM: ?Access Code: [email or web address] ?URL: [email or web address] ?Prepared by: Benito Mccreedy ?  ?ASSESSMENT: ?  ?CLINICAL IMPRESSION: ?01/05/22: Pt comes in for last sched appt due to upcoming lack of transport and states doing better, describing better function and does have some better A/ROM today. However her strength and fnl outcome measures are much worse today, which doesn't seem to fit. She did meet the most important goal of self-management which is likely helping her keep things in perspective.  ? ?GOALS: ?Goals reviewed with patient? No ?  ?SHORT TERM GOALS: (STG required if POC>30 days) ?  ?LONG TERM GOALS:  ?  ?LTG Name Target Date Goal status  ?1 Pt will decrease impairment per Quick DASH from 45% to 20% or better for increased quality of life 01/07/22 Not Met  ?2 Pt will increase  Lt wrist flexion A/ROM from 26* to at least 45* for better flexibility to grasp objects  01/07/22 Met  ?3 Pt will increase Lt grip strength from 13# painfully to at least 25# for BADL home use  3/17/

## 2022-01-06 ENCOUNTER — Ambulatory Visit (INDEPENDENT_AMBULATORY_CARE_PROVIDER_SITE_OTHER): Payer: 59

## 2022-01-06 ENCOUNTER — Other Ambulatory Visit: Payer: Self-pay

## 2022-01-06 DIAGNOSIS — Z4802 Encounter for removal of sutures: Secondary | ICD-10-CM

## 2022-01-06 NOTE — Progress Notes (Signed)
NTS Suture removal, No s/s of infection, path to pt. 

## 2022-01-11 ENCOUNTER — Telehealth: Payer: Self-pay

## 2022-01-11 ENCOUNTER — Ambulatory Visit: Payer: 59

## 2022-01-11 NOTE — Telephone Encounter (Signed)
Called patient, LMVM. Following up to inquire if she is ready to schedule surgery, and when with Dr. Luppens. Call our office back. ?

## 2022-01-15 ENCOUNTER — Other Ambulatory Visit: Payer: Self-pay | Admitting: Family Medicine

## 2022-01-17 ENCOUNTER — Other Ambulatory Visit: Payer: Self-pay | Admitting: Family Medicine

## 2022-01-17 NOTE — Telephone Encounter (Signed)
What are the nasal sprays we have to choose from that are covered by her insurance? Thank you

## 2022-01-17 NOTE — Telephone Encounter (Signed)
Oh no. Do they have a formulary so we know what choices to offer to the patient?I guess we could ask what she wants to try and see if its covered. That seems like the long way around it though.

## 2022-01-18 ENCOUNTER — Telehealth: Payer: Self-pay | Admitting: *Deleted

## 2022-01-18 ENCOUNTER — Other Ambulatory Visit: Payer: Self-pay | Admitting: *Deleted

## 2022-01-18 MED ORDER — TRIAMCINOLONE ACETONIDE 55 MCG/ACT NA AERO: INHALATION_SPRAY | NASAL | 5 refills | Status: DC

## 2022-01-18 NOTE — Telephone Encounter (Signed)
I attempted a PA for Flunisolide and it was denied since it is non formulary. Patient has tried and failed Flonase, Dymista, Astelin, and Olopatadine. Unfortunately the PA did not provide alternatives. Can you give me some other nasal sprays that you may like for the patient to try in place of the Flunisolide and I can look at coverage?  ?

## 2022-01-18 NOTE — Telephone Encounter (Signed)
PA has been submitted through CoverMyMeds for Flunisolide and is currently pending approval/denial.  ?

## 2022-01-18 NOTE — Telephone Encounter (Signed)
New prescription has been sent in. Called and left a detailed voicemail advising of change in medication per DPR permission.  

## 2022-01-18 NOTE — Telephone Encounter (Signed)
Triamcinolone nasal spray? Thank you

## 2022-01-25 NOTE — Progress Notes (Signed)
? ?Follow Up Note ? ?RE: Laura Fields MRN: [id number] DOB: Oct 16, 1961 ?Date of Office Visit: 01/26/2022 ? ?Referring provider: Olive Bass,* ?Primary care provider: Olive Bass, FNP ? ?Chief Complaint: Asthma and Follow-up (Pt states that she noticed some improvement in breathing and coughing episodes. She had to use her neb 2x since LOV) ? ?History of Present Illness: ?I had the pleasure of seeing Laura Fields for a follow up visit at the [facility] on 01/26/2022. She is a 61 y.o. female, who is being followed for asthma, allergic rhinoconjunctivitis, atopic dermatitis and GERD. Her previous allergy office visit was on 12/29/2021 with Dr. Selena Fields. Today is a regular follow up visit.  She is accompanied today by her son who provided/contributed to the history.  ? ?Asthma  ?Patient has been doing better since started on Tezspire in March 2023. Today is the second injection.  ?Currently on Symbicort 2 puffs twice a day and Spiriva 2 puffs once a day, Singulair daily. ?Used nebulizer twice since the last visit. ? ?Denies any ER/urgent care visits or prednisone use since the last visit. ? ?Some coughing and uses Tessalon perles as needed - last use was a few weeks ago.  ?  ?Allergic rhinoconjunctivitis ?Currently taking Xyzal 5mg  once a day, Singulair 10mg  daily. ?Taking azelastine, flunisolide, epinastine eye drops as needed about twice per month with good benefit. ?Interested in starting AIT. Not a candidate for rush.  ? ?Atopic dermatitis ?Controlled. ?  ?Gastroesophageal reflux disease ?Had EGD and colonoscopy in February 2023 which was unremarkable.  ? ?Assessment and Plan: ?Laura Fields is a 61 y.o. female with: ?Severe persistent asthma without complication ?Past history - Persistent symptoms with coughing, mucous. Mild benefit with systemic steroids but does not like to take. Alvesco showed no benefit. Last CXR in November 2022. ?Interim history - started Tezspire in March  2023 and noted improvement already.  ?Today's spirometry was normal.  ?Tezspire injection given today - continue every 4 weeks.  ?Daily controller medication(s): START Breztri 2 puffs twice a day with spacer and rinse mouth afterwards. 3 samples given.  ?This replaces Symbicort and Spiriva. If not covered let December 2022 know.  ?Continue Singulair (montelukast) 10mg  daily at night. ?During upper respiratory infections/flares:  ?Start Pulmicort nebulizer twice a day for 1-2 weeks until your breathing symptoms return to baseline.  ?Pretreat with albuterol 2 puffs or albuterol nebulizer.  ?If you need to use your albuterol nebulizer machine back to back within 15-30 minutes with no relief then please go to the ER/urgent care for further evaluation.  ?May use albuterol rescue inhaler 2 puffs or nebulizer every 4 to 6 hours as needed for shortness of breath, chest tightness, coughing, and wheezing. May use albuterol rescue inhaler 2 puffs 5 to 15 minutes prior to strenuous physical activities. Monitor frequency of use.  ?Refilled tessalon perles - only use if needed.  ?Get spirometry at next visit. ?If symptoms worsen - get CXR next and possible cardiac work up. ? ?Seasonal allergic rhinitis due to pollen ?Past history - 2021 skin testing was positive to grass, ragweed, weed. ?Interim history - only uses nasal sprays and eye drops prn. Interested in AIT. ?Continue environmental control measures.  ?Take Xyzal 5 mg once a day as needed for a runny nose or itch. May take twice a day during flares. ?Use azelastine nasal spray 1-2 sprays per nostril twice a day as needed for runny nose/drainage. ?Use flunisolide nasal spray 1-2 sprays per nostril 2-3  times a day as needed for nasal congestion.  ?Nasal saline spray (i.e., Simply Saline) or nasal saline lavage (i.e., NeilMed) is recommended as needed and prior to medicated nasal sprays. ?Continue epinastine eye drops 1 drop in each eye twice a day as needed for red, itchy eyes. ?If  doing well at next visit - will discuss starting allergy injections.  ? ?Allergic conjunctivitis of both eyes ?See assessment and plan as above. ? ?Other atopic dermatitis ?Controlled. ?Continue proper skin care.  ?Continue desonide 0.05% ointment to red, itchy areas twice a day as needed. Do not use this medication longer than 3 weeks in a row.  ? ?Gastroesophageal reflux disease ?Normal EGD in February 2023. ?Managed by GI. ? ?Return in about 4 weeks (around 02/23/2022). ? ?Meds ordered this encounter  ?Medications  ? benzonatate (TESSALON) 100 MG capsule  ?  Sig: Take 2 capsules (200 mg total) by mouth 3 (three) times daily as needed for cough.  ?  Dispense:  60 capsule  ?  Refill:  0  ? Budeson-Glycopyrrol-Formoterol (BREZTRI AEROSPHERE) 160-9-4.8 MCG/ACT AERO  ?  Sig: Inhale 2 puffs into the lungs in the morning and at bedtime. with spacer and rinse mouth afterwards.  ?  Dispense:  10.7 g  ?  Refill:  3  ? levocetirizine (XYZAL) 5 MG tablet  ?  Sig: Take 1 tablet (5 mg total) by mouth every evening.  ?  Dispense:  34 tablet  ?  Refill:  5  ? ?Lab Orders  ?No laboratory test(s) ordered today  ? ? ?Diagnostics: ?Spirometry:  ?Tracings reviewed. Her effort: Good reproducible efforts. ?FVC: 2.59L ?FEV1: 1.98L, 91% predicted ?FEV1/FVC ratio: 76% ?Interpretation: Spirometry consistent with normal pattern.  ?Please see scanned spirometry results for details. ? ?Medication List:  ?Current Outpatient Medications  ?Medication Sig Dispense Refill  ? albuterol (PROVENTIL) (2.5 MG/3ML) 0.083% nebulizer solution Take 3 mLs (2.5 mg total) by nebulization every 4 (four) hours as needed for wheezing or shortness of breath (coughing fits). 75 mL 1  ? albuterol (VENTOLIN HFA) 108 (90 Base) MCG/ACT inhaler INHALE 2 PUFFS INTO THE LUNGS EVERY 4 HOURS AS NEEDED FOR WHEEZE OR FOR SHORTNESS OF BREATH 18 each 0  ? AMBULATORY NON FORMULARY MEDICATION Apply 1 application topically at bedtime. Medication Name: Compounded Hydroquinone 8%,  Tretinoin 0.025%, Kojic Acid 1%, Niacinamide 4%, Fluocinolone 0.025% Cream ([employer]) 30 g 0  ? azelastine (ASTELIN) 0.1 % nasal spray Place 2 sprays into both nostrils 2 (two) times daily as needed for rhinitis. 30 mL 5  ? baclofen (LIORESAL) 10 MG tablet Take 10 mg by mouth 3 (three) times daily.    ? Budeson-Glycopyrrol-Formoterol (BREZTRI AEROSPHERE) 160-9-4.8 MCG/ACT AERO Inhale 2 puffs into the lungs in the morning and at bedtime. with spacer and rinse mouth afterwards. 10.7 g 3  ? budesonide (PULMICORT) 0.5 MG/2ML nebulizer solution Take 2 mLs (0.5 mg total) by nebulization in the morning and at bedtime. For the next 2 weeks and use during asthma flares. 120 mL 2  ? cyclobenzaprine (FLEXERIL) 5 MG tablet Take 5 mg by mouth 3 (three) times daily as needed for muscle spasms. At night time    ? desonide (DESOWEN) 0.05 % ointment APPLY 1 APPLICATION TOPICALLY 2 TIMES DAILY AS NEEDED TO RED ITCHY AREAS TO THE FACE. 45 g 0  ? DULoxetine (CYMBALTA) 60 MG capsule Take 60 mg by mouth daily.    ? Epinastine HCl 0.05 % ophthalmic solution Apply to eye as needed.    ?  famotidine (PEPCID) 20 MG tablet TAKE 1 TABLET BY MOUTH TWICE A DAY 180 tablet 0  ? flunisolide (NASALIDE) 25 MCG/ACT (0.025%) SOLN 2 SPRAYS PER NOSTRIL 2-3 TIMES PER DAY AS NEEDED FOR STUFFY NOSE. 75 mL 0  ? gabapentin (NEURONTIN) 100 MG capsule Take 1 capsule (100 mg total) by mouth 3 (three) times daily. 90 capsule 3  ? hydrOXYzine (ATARAX/VISTARIL) 25 MG tablet Take 1 tablet (25 mg total) by mouth 3 (three) times daily as needed for itching. 60 tablet 0  ? levocetirizine (XYZAL) 5 MG tablet Take 1 tablet (5 mg total) by mouth every evening. 34 tablet 5  ? meloxicam (MOBIC) 15 MG tablet Take 15 mg by mouth daily.    ? montelukast (SINGULAIR) 10 MG tablet TAKE 1 TABLET BY MOUTH EVERYDAY AT BEDTIME 90 tablet 0  ? Olopatadine HCl 0.6 % SOLN PLACE 2 SPRAYS INTO BOTH NOSTRILS 2 (TWO) TIMES DAILY AS NEEDED. 30.5 g 5  ? ondansetron (ZOFRAN) 4 MG tablet  Take 1 tablet (4 mg total) by mouth every 8 (eight) hours as needed. 20 tablet 0  ? pantoprazole (PROTONIX) 40 MG tablet Take 1 tablet (40 mg total) by mouth daily. 30 tablet 6  ? phenazopyridine (PYRIDIUM) 10

## 2022-01-26 ENCOUNTER — Ambulatory Visit (INDEPENDENT_AMBULATORY_CARE_PROVIDER_SITE_OTHER): Payer: 59

## 2022-01-26 ENCOUNTER — Ambulatory Visit: Payer: 59 | Admitting: Allergy

## 2022-01-26 ENCOUNTER — Encounter: Payer: Self-pay | Admitting: Allergy

## 2022-01-26 VITALS — BP 138/78 | HR 74 | Temp 97.8°F | Resp 18 | Wt 166.4 lb

## 2022-01-26 DIAGNOSIS — L2089 Other atopic dermatitis: Secondary | ICD-10-CM

## 2022-01-26 DIAGNOSIS — H1013 Acute atopic conjunctivitis, bilateral: Secondary | ICD-10-CM

## 2022-01-26 DIAGNOSIS — J301 Allergic rhinitis due to pollen: Secondary | ICD-10-CM

## 2022-01-26 DIAGNOSIS — J4550 Severe persistent asthma, uncomplicated: Secondary | ICD-10-CM | POA: Diagnosis not present

## 2022-01-26 DIAGNOSIS — K219 Gastro-esophageal reflux disease without esophagitis: Secondary | ICD-10-CM

## 2022-01-26 MED ORDER — LEVOCETIRIZINE DIHYDROCHLORIDE 5 MG PO TABS: 5.0000 mg | ORAL_TABLET | Freq: Every evening | ORAL | 5 refills | Status: DC

## 2022-01-26 MED ORDER — BENZONATATE 100 MG PO CAPS
200.0000 mg | ORAL_CAPSULE | Freq: Three times a day (TID) | ORAL | 0 refills | Status: DC | PRN
Start: 2022-01-26 — End: 2022-06-29

## 2022-01-26 MED ORDER — BREZTRI AEROSPHERE 160-9-4.8 MCG/ACT IN AERO: 2.0000 | INHALATION_SPRAY | Freq: Two times a day (BID) | RESPIRATORY_TRACT | 3 refills | Status: DC

## 2022-01-26 NOTE — Assessment & Plan Note (Addendum)
Past history - Persistent symptoms with coughing, mucous. Mild benefit with systemic steroids but does not like to take. Alvesco showed no benefit. Last CXR in November 2022. ?Interim history - started Tezspire in March 2023 and noted improvement already.  ?? Today's spirometry was normal.  ?? Tezspire injection given today - continue every 4 weeks.  ?? Daily controller medication(s): START Breztri 2 puffs twice a day with spacer and rinse mouth afterwards. 3 samples given.  ?o This replaces Symbicort and Spiriva. If not covered let us know.  ?o Continue Singulair (montelukast) 10mg  daily at night. ?? During upper respiratory infections/flares:  ?o Start Pulmicort nebulizer twice a day for 1-2 weeks until your breathing symptoms return to baseline.  ?o Pretreat with albuterol 2 puffs or albuterol nebulizer.  ?o If you need to use your albuterol nebulizer machine back to back within 15-30 minutes with no relief then please go to the ER/urgent care for further evaluation.  ?? May use albuterol rescue inhaler 2 puffs or nebulizer every 4 to 6 hours as needed for shortness of breath, chest tightness, coughing, and wheezing. May use albuterol rescue inhaler 2 puffs 5 to 15 minutes prior to strenuous physical activities. Monitor frequency of use.  ?? Refilled tessalon perles - only use if needed.  ?? Get spirometry at next visit. ?? If symptoms worsen - get CXR next and possible cardiac work up. ?

## 2022-01-26 NOTE — Assessment & Plan Note (Signed)
.   See assessment and plan as above. 

## 2022-01-26 NOTE — Assessment & Plan Note (Signed)
Controlled. ?? Continue proper skin care.  ?? Continue desonide 0.05% ointment to red, itchy areas twice a day as needed. Do not use this medication longer than 3 weeks in a row.  ?

## 2022-01-26 NOTE — Patient Instructions (Addendum)
Asthma ?Tezspire injection given today - continue every 4 weeks.  ?  ?Daily controller medication(s): START Breztri 2 puffs twice a day with spacer and rinse mouth afterwards. 3 samples given.  ?This replaces Symbicort and Spiriva. If not covered let us know.  ?Continue Singulair (montelukast) 10mg  daily at night. ?During upper respiratory infections/flares:  ?Start Pulmicort nebulizer twice a day for 1-2 weeks until your breathing symptoms return to baseline.  ?Pretreat with albuterol 2 puffs or albuterol nebulizer.  ?If you need to use your albuterol nebulizer machine back to back within 15-30 minutes with no relief then please go to the ER/urgent care for further evaluation.  ?May use albuterol rescue inhaler 2 puffs or nebulizer every 4 to 6 hours as needed for shortness of breath, chest tightness, coughing, and wheezing. May use albuterol rescue inhaler 2 puffs 5 to 15 minutes prior to strenuous physical activities. Monitor frequency of use.  ?Asthma control goals:  ?Full participation in all desired activities (may need albuterol before activity) ?Albuterol use two times or less a week on average (not counting use with activity) ?Cough interfering with sleep two times or less a month ?Oral steroids no more than once a year ?No hospitalizations  ? ?Allergic rhinitis ?2021 skin testing was positive to grass, ragweed, weed. ?Continue environmental control measures as below.  ?Take Xyzal 5 mg once a day as needed for a runny nose or itch. May take twice a day during flares. ?Use azelastine nasal spray 1-2 sprays per nostril twice a day as needed for runny nose/drainage. ?Use flunisolide nasal spray 1-2 sprays per nostril 2-3 times a day as needed for nasal congestion.  ?Nasal saline spray (i.e., Simply Saline) or nasal saline lavage (i.e., NeilMed) is recommended as needed and prior to medicated nasal sprays. ? ?Continue epinastine eye drops 1 drop in each eye twice a day as needed for red, itchy eyes. ?If doing  well at next visit - will discuss starting allergy injections.  ? ?Atopic dermatitis ?Continue a daily moisturizing routine ?Continue desonide 0.05% ointment to red, itchy areas twice a day as needed. Do not use this medication longer than 3 weeks in a row.  ? ?Follow up in 1 month or sooner if needed. ? ?Reducing Pollen Exposure ?Pollen seasons: trees (spring), grass (summer) and ragweed/weeds (fall). ?Keep windows closed in your home and car to lower pollen exposure.  ?Install air conditioning in the bedroom and throughout the house if possible.  ?Avoid going out in dry windy days - especially early morning. ?Pollen counts are highest between 5 - 10 AM and on dry, hot and windy days.  ?Save outside activities for late afternoon or after a heavy rain, when pollen levels are lower.  ?Avoid mowing of grass if you have grass pollen allergy. ?Be aware that pollen can also be transported indoors on people and pets.  ?Dry your clothes in an automatic dryer rather than hanging them outside where they might collect pollen.  ?Rinse hair and eyes before bedtime. ? ?

## 2022-01-26 NOTE — Assessment & Plan Note (Signed)
Past history - 2021 skin testing was positive to grass, ragweed, weed. ?Interim history - only uses nasal sprays and eye drops prn. Interested in AIT. ?? Continue environmental control measures.  ?? Take Xyzal 5 mg once a day as needed for a runny nose or itch. May take twice a day during flares. ?? Use azelastine nasal spray 1-2 sprays per nostril twice a day as needed for runny nose/drainage. ?? Use flunisolide nasal spray 1-2 sprays per nostril 2-3 times a day as needed for nasal congestion.  ?? Nasal saline spray (i.e., Simply Saline) or nasal saline lavage (i.e., NeilMed) is recommended as needed and prior to medicated nasal sprays. ?? Continue epinastine eye drops 1 drop in each eye twice a day as needed for red, itchy eyes. ?? If doing well at next visit - will discuss starting allergy injections.  ?

## 2022-01-26 NOTE — Assessment & Plan Note (Signed)
Normal EGD in February 2023. ?? Managed by GI. ?

## 2022-02-01 ENCOUNTER — Ambulatory Visit (HOSPITAL_BASED_OUTPATIENT_CLINIC_OR_DEPARTMENT_OTHER): Payer: 59 | Attending: Podiatry | Admitting: Physical Therapy

## 2022-02-01 ENCOUNTER — Encounter (HOSPITAL_BASED_OUTPATIENT_CLINIC_OR_DEPARTMENT_OTHER): Payer: Self-pay | Admitting: Physical Therapy

## 2022-02-01 DIAGNOSIS — M7661 Achilles tendinitis, right leg: Secondary | ICD-10-CM | POA: Insufficient documentation

## 2022-02-01 DIAGNOSIS — Z9889 Other specified postprocedural states: Secondary | ICD-10-CM | POA: Insufficient documentation

## 2022-02-01 DIAGNOSIS — M25571 Pain in right ankle and joints of right foot: Secondary | ICD-10-CM | POA: Diagnosis not present

## 2022-02-01 DIAGNOSIS — M6281 Muscle weakness (generalized): Secondary | ICD-10-CM | POA: Insufficient documentation

## 2022-02-01 DIAGNOSIS — R262 Difficulty in walking, not elsewhere classified: Secondary | ICD-10-CM | POA: Diagnosis not present

## 2022-02-01 NOTE — Therapy (Signed)
?OUTPATIENT PHYSICAL THERAPY LOWER EXTREMITY RECERTIFICATION ? ? ?Patient Name: Laura Fields ?MRN: [id number] ?DOB:[birth year], 61 y.o., female ?Today's Date: 02/01/2022 ? ? PT End of Session - 02/01/22 1112   ? ? Visit Number 19   ? Number of Visits 31   ? Date for PT Re-Evaluation 03/15/22   ? PT Start Time 1025   ? PT Stop Time 1110   ? PT Time Calculation (min) 45 min   ? Activity Tolerance Patient tolerated treatment well   ? Behavior During Therapy [facility] for tasks assessed/performed   ? ?  ?  ? ?  ? ? ?Past Medical History:  ?Diagnosis Date  ? Allergy   ? Anxiety   ? Arthritis   ? Asthma   ? Depression   ? Fibromyalgia   ? Intrinsic atopic dermatitis 12/01/2020  ? Osteoporosis   ? Raynaud's disease   ? Sciatica   ? ?Past Surgical History:  ?Procedure Laterality Date  ? ABDOMINAL HYSTERECTOMY    ? ACHILLES TENDON REPAIR Right 07/02/2021  ? AUGMENTATION MAMMAPLASTY Bilateral 1999  ? Saline/ under muscle  ? back nerve    ? block  ? COLONOSCOPY  09/23/2013  ? [facility]  ? colonoscopy with endo  11/30/2021  ? CYST REMOVAL NECK Left 12/23/2021  ? GASTRIC BYPASS    ? KNEE ARTHROSCOPY    ? mass removable    ? on thumb  ? STOMACH SURGERY    ? ?Patient Active Problem List  ? Diagnosis Date Noted  ? Severe persistent asthma without complication 32/54/9826  ? Seasonal allergic rhinitis due to pollen 12/29/2021  ? Hand cramps 11/26/2021  ? Left hand weakness 10/11/2021  ? Numbness and tingling in left hand 09/02/2021  ? Gastroesophageal reflux disease 07/28/2021  ? Other atopic dermatitis 12/01/2020  ? Pruritic rash 02/12/2020  ? Allergic conjunctivitis of both eyes 02/12/2020  ? ? ?PCP: Marrian Salvage, FNP ? ?REFERRING PROVIDER: Garrel Ridgel, DPM ? ?REFERRING DIAG: M76.61 (ICD-10-CM) - Achilles tendinitis of right lower extremity  ?           E15.830 (ICD-10-CM) - Status post foot surgery  ?THERAPY DIAG:  ?Pain in right ankle and joints of right foot ? ?Muscle weakness (generalized) ? ?Difficulty in walking, not  elsewhere classified ? ?ONSET DATE: Surgery 07/02/2021 ? ?SUBJECTIVE:  ? ?SUBJECTIVE STATEMENT: ?-Pt felt a pull in her calf and achilles while walking in 2020.  Pt reports it gradually worsened over time.  She waited to have surgery due to Covid pandemic.  Pt is nearly 7 months s/p gastroc recession Achilles tenolysis (tendon repair) spur resection (07/02/2021).  Pt was seen in PT from November 2022 to march 2023.  Pt has been receiving PT including aquatic therapy and reports she has improved with aquatic therapy.  Pt states she typically feels better for 2-3 days after aquatic PT.  Pt had to stop aquatic therapy du to having cervical surgery on 12/23/2021 to remove cysts.  Pt states she hasn't had H/A's since having cysts removed.  Pt had her sutures removed on 01/06/2022.   ? ?-Pt saw surgeon on 01/04/2022 and MD ordered pt to continue with PT including starting back water therapy to strengthen her foot and leg.  Pt reports MD states she has scar tissue.  MD note indicated: Well-healing surgical foot with minor setback possibly secondary to tear of some scar tissue.   MD script indicated Aqua PT- s/p achilles tendon repair. ? ?-Pt states the swelling is an  issue.  Pt reports she has weakness.  Pt states she has difficulty with heel to toe gait and has balance deficits.  Pt is limited with ambulation distance and standing duration.  Pt reports compliance with HEP.  Pt has difficulty with performing stairs.  Pt has increased pain with rainy/damp weather.  Pt occasionally uses her boot.  ? ?-Pt has been receiving OT for her L hand which she just completed in March. ? ? ?PERTINENT HISTORY: ?-gastroc recession Achilles tenolysis (tendon repair) spur resection (07/02/2021) ?-Rheumatoid syndrome per pt, Osteoporosis, Fibromyalgia, Raynaud's disease, DDD lumbar spine and sciatica, and L knee and LE surgery has rods in distal LE LE  ? ?PAIN:  ?Are you having pain? 8/10 current, 20/10 worst, 4/10 best ? ?PRECAUTIONS: Other:  achilles tendon repair and gastroc recession surgery, osteoporosis, raynaud's disease.  Pt ambulating with cane. ? ?WEIGHT BEARING RESTRICTIONS WBAT ? ?FALLS:  ?Has patient fallen in last 6 months? Yes, 2 falls.  Pt states the top of her foot gave out.  Last fall 3-4 months ago.   ? ?LIVING ENVIRONMENT: ?Lives with: lives alone ?Lives in: House/apartment ?Stairs: 1 story home with 3 steps and bilat rails  ?Has following equipment at home:  CAM walker boot and 3 pronged cane ? ?OCCUPATION: Pt is on long term disability ? ?PLOF: Independent Pt reports she used a boot for 2.5 years prior to surgery.  Pt ambulated without a cane.  Pt able to ambulate and perform functional mobility with less pain and increased ease. ? ?PATIENT GOALS  Pt wants to be able to travel.  Improve ambulation and walk without cane ? ? ?OBJECTIVE:  ? ?PATIENT SURVEYS:  ?FOTO 25 with a goal of 55 at visit #17 ? ?COGNITION: ? Overall cognitive status: Within functional limits for tasks assessed   ?  ?OBSERVATION:   ? Pt has a small incision on L sided cervical which is closed.  Incisions in distal R LE are well healed ? ? ?PALPATION: ?TTP at R achilles > R calf ? ?LE ROM: ? ?AROM/PROM Right ?02/01/2022 Left ?02/01/2022  ?Hip flexion    ?Hip extension    ?Hip abduction    ?Hip adduction    ?Hip internal rotation    ?Hip external rotation    ?Knee flexion    ?Knee extension    ?Ankle dorsiflexion 0/10   ?Ankle plantarflexion 56   ?Ankle inversion 39   ?Ankle eversion 10/14   ? (Blank rows = not tested) ? ?LE STRENGTH: ? ?MMT Right ?02/01/2022 Left ?02/01/2022  ?Hip flexion    ?Hip extension    ?Hip abduction    ?Hip adduction    ?Hip internal rotation    ?Hip external rotation    ?Knee flexion    ?Knee extension    ?Ankle dorsiflexion Tol min reistance   ?Ankle plantarflexion Tol min resistance with pain   ?Ankle inversion 4/5   ?Ankle eversion 4-/5   ? (Blank rows = not tested) ? ? ? ?GAIT: ?Assistive device utilized:  3 pronged cane ?Comments: Pt  favors R LE with increased stance time on L LE.  Pt has decreased Wb'ing thru R LE. Pt has decreased DF and foot clearance with gait.  Pt feels "pulling" in ankle with gait.   ? ? ? ?TODAY'S TREATMENT: ?See below for pt education ? ? ?PATIENT EDUCATION:  ?Education details: Educated pt concerning objective findings including comparisons to prior testing.  POC.  Instructed pt to cont with HEP. ?Person educated: Patient ?  Education method: Explanation ?Education comprehension: verbalized understanding ? ? ?HOME EXERCISE PROGRAM: ?[id number] ? ?ASSESSMENT: ? ?CLINICAL IMPRESSION: ?Patient is a 61 y.o. female nearly 7 months post op and was last seen in PT on 12/15/2021.  Pt has made slow progress though has progressed overall.  She reports she was making good progress while in aquatic PT.  She had to stop aquatic PT due to having cervical surgery to remove a cyst.  Pt has now been cleared to resume PT and MD ordered to continue aquatic therapy.  Pt continues to have gait deficits including decreased stance time and Wb'ing on R LE with decreased DF.  Pt ambulates with a 3 pronged cane.  Pt has decreased DF and eversion strength and AROM compared to prior testing.  Pt is limited with her normal functional mobility skills including ambulation distance and stairs.  Pt is limited with standing duration and performing household chores.  Pt met goal of independence with HEP though has not met other goals.  PT updated goals today.  Pt should benefit from skilled PT services including aquatic therapy to address goals, improve ROM and strength, and to assist in improving overall function.       ? ? ?OBJECTIVE IMPAIRMENTS Abnormal gait, decreased activity tolerance, decreased endurance, decreased mobility, difficulty walking, decreased ROM, decreased strength, hypomobility, impaired flexibility, and pain.  ? ?ACTIVITY LIMITATIONS cleaning, community activity, meal prep, and ambulation .  ? ?PERSONAL FACTORS 3+ comorbidities:  Rheumatoid syndrome per pt, Osteoporosis, Fibromyalgia, Raynaud's disease, DDD lumbar spine and sciatica,  are also affecting patient's functional outcome.  ? ? ?REHAB POTENTIAL: Good ? ?CLINICAL DECISION MAKING: Evolving

## 2022-02-03 ENCOUNTER — Ambulatory Visit: Payer: 59 | Admitting: Podiatry

## 2022-02-03 ENCOUNTER — Encounter: Payer: Self-pay | Admitting: Podiatry

## 2022-02-03 DIAGNOSIS — Z9889 Other specified postprocedural states: Secondary | ICD-10-CM | POA: Diagnosis not present

## 2022-02-03 DIAGNOSIS — M7661 Achilles tendinitis, right leg: Secondary | ICD-10-CM | POA: Diagnosis not present

## 2022-02-03 MED ORDER — METHYLPREDNISOLONE 4 MG PO TBPK: ORAL_TABLET | ORAL | 0 refills | Status: DC

## 2022-02-03 MED ORDER — OXYCODONE-ACETAMINOPHEN 10-325 MG PO TABS: 1.0000 | ORAL_TABLET | Freq: Three times a day (TID) | ORAL | 0 refills | Status: AC | PRN

## 2022-02-06 NOTE — Progress Notes (Signed)
She presents today walking with a cane with a chief concern of pain that is still present around the medial aspect of the right foot her Achilles tendon repair and spur resection were performed back in September 2022.  She states that it was doing well while she was in water therapy and then she felt a pop in the medial aspect of the foot near the ankle which caused a lot of pain.  She states that it still swells considerably and is still exquisitely painful. ? ?Objective: Vital signs are stable she is alert and oriented x3 it appears that her ankle is minimally edematous today though she does present in her boot with a cane.  She also presents with enlargement of her gastrosoleus complex which appears to be getting stronger as she is walking on it.  She still has not really painful allodynic type pain around the medial ankle.  Muscle strength seems to be normal and symmetrical. ? ?Assessment: Allodynia cannot rule out a CRPS of the right foot. ? ?Plan: At this point she is going to go back to water therapy and started her on methylprednisolone and I provided her with a few oxycodone to help alleviate her pain at nighttime. ?

## 2022-02-08 ENCOUNTER — Ambulatory Visit (HOSPITAL_BASED_OUTPATIENT_CLINIC_OR_DEPARTMENT_OTHER): Payer: 59 | Admitting: Physical Therapy

## 2022-02-14 ENCOUNTER — Encounter (HOSPITAL_BASED_OUTPATIENT_CLINIC_OR_DEPARTMENT_OTHER): Payer: Self-pay | Admitting: Physical Therapy

## 2022-02-14 ENCOUNTER — Ambulatory Visit (HOSPITAL_BASED_OUTPATIENT_CLINIC_OR_DEPARTMENT_OTHER): Payer: 59 | Admitting: Physical Therapy

## 2022-02-14 DIAGNOSIS — M25571 Pain in right ankle and joints of right foot: Secondary | ICD-10-CM

## 2022-02-14 DIAGNOSIS — M7661 Achilles tendinitis, right leg: Secondary | ICD-10-CM | POA: Diagnosis not present

## 2022-02-14 DIAGNOSIS — M6281 Muscle weakness (generalized): Secondary | ICD-10-CM

## 2022-02-14 DIAGNOSIS — R262 Difficulty in walking, not elsewhere classified: Secondary | ICD-10-CM

## 2022-02-14 NOTE — Therapy (Signed)
?OUTPATIENT PHYSICAL THERAPY TREATMENT NOTE ? ? ?Patient Name: Laura Fields ?MRN: [id number] ?DOB:July 07, 1961, 61 y.o., female ?Today's Date: 02/14/2022 ? ?PCP: Marrian Salvage, [place] ?REFERRING PROVIDER: Marrian Salvage,* ? ?END OF SESSION:  ? PT End of Session - 02/14/22 0709   ? ? Visit Number 20   ? Number of Visits 31   ? Date for PT Re-Evaluation 03/15/22   ? PT Start Time 0708   ? PT Stop Time 0728   ? PT Time Calculation (min) 20 min   ? Activity Tolerance Patient tolerated treatment well   ? Behavior During Therapy [facility] for tasks assessed/performed   ? ?  ?  ? ?  ? ? ?Past Medical History:  ?Diagnosis Date  ? Allergy   ? Anxiety   ? Arthritis   ? Asthma   ? Depression   ? Fibromyalgia   ? Intrinsic atopic dermatitis 12/01/2020  ? Osteoporosis   ? Raynaud's disease   ? Sciatica   ? ?Past Surgical History:  ?Procedure Laterality Date  ? ABDOMINAL HYSTERECTOMY    ? ACHILLES TENDON REPAIR Right 07/02/2021  ? AUGMENTATION MAMMAPLASTY Bilateral 1999  ? Saline/ under muscle  ? back nerve    ? block  ? COLONOSCOPY  09/23/2013  ? [facility]  ? colonoscopy with endo  11/30/2021  ? CYST REMOVAL NECK Left 12/23/2021  ? GASTRIC BYPASS    ? KNEE ARTHROSCOPY    ? mass removable    ? on thumb  ? STOMACH SURGERY    ? ?Patient Active Problem List  ? Diagnosis Date Noted  ? Severe persistent asthma without complication [id number]  ? Seasonal allergic rhinitis due to pollen 12/29/2021  ? Hand cramps 11/26/2021  ? Left hand weakness 10/11/2021  ? Numbness and tingling in left hand 09/02/2021  ? Gastroesophageal reflux disease 07/28/2021  ? Other atopic dermatitis 12/01/2020  ? Pruritic rash 02/12/2020  ? Allergic conjunctivitis of both eyes 02/12/2020  ? ?REFERRING PROVIDER: Garrel Ridgel, DPM ?  ?REFERRING DIAG: M76.61 (ICD-10-CM) - Achilles tendinitis of right lower extremity  ?           Z30.865 (ICD-10-CM) - Status post foot surgery  ?THERAPY DIAG:  ?Pain in right ankle and joints of right foot ?  ?Muscle  weakness (generalized) ?  ?Difficulty in walking, not elsewhere classified ?  ?ONSET DATE: Surgery 07/02/2021 ?  ?SUBJECTIVE:  ?  ?SUBJECTIVE STATEMENT: ?Pt reports she had a fall last week; cancelled therapy appt because of it.   "If I'm having a swelling episode I wear the boot".  She reports her ankle swells and throbs like a tooth ache almost every afternoon.  She has continued the band exercises for her ankle at home.   ?  ?PERTINENT HISTORY: ?-gastroc recession Achilles tenolysis (tendon repair) spur resection (07/02/2021) ?-Rheumatoid syndrome per pt, Osteoporosis, Fibromyalgia, Raynaud's disease, DDD lumbar spine and sciatica, and L knee and LE surgery has rods in distal LE LE  ?  ?PAIN:  ?Are you having pain? Yes ?6/10 ?Location: Post Rt ankle / calf ?  ?PRECAUTIONS: Other: achilles tendon repair and gastroc recession surgery, osteoporosis, raynaud's disease.  Pt ambulating with cane. ?  ?WEIGHT BEARING RESTRICTIONS WBAT ?  ?FALLS:  ?Has patient fallen in last 6 months? Yes, 2 falls.  Pt states the top of her foot gave out.  Last fall 3-4 months ago.   ?  ?LIVING ENVIRONMENT: ?Lives with: lives alone ?Lives in: House/apartment ?Stairs: 1 story home with 3  steps and bilat rails  ?Has following equipment at home:  CAM walker boot and 3 pronged cane ?  ?OCCUPATION: Pt is on long term disability ?  ?PLOF: Independent Pt reports she used a boot for 2.5 years prior to surgery.  Pt ambulated without a cane.  Pt able to ambulate and perform functional mobility with less pain and increased ease. ?  ?PATIENT GOALS  Pt wants to be able to travel.  Improve ambulation and walk without cane ?  ?  ?OBJECTIVE:  * Findings taken at [facility] unless otherwise noted.   ?  ?PATIENT SURVEYS:  ?FOTO 25 with a goal of 55 at visit #17 ?  ?COGNITION: ?          Overall cognitive status: Within functional limits for tasks assessed               ?           ?OBSERVATION:   ?          Pt has a small incision on L sided cervical which is  closed.  Incisions in distal R LE are well healed ?  ?  ?PALPATION: ?TTP at R achilles > R calf ?  ?LE ROM: ?  ?AROM/PROM Right ?02/01/2022 Left ?02/01/2022  ?Hip flexion      ?Hip extension      ?Hip abduction      ?Hip adduction      ?Hip internal rotation      ?Hip external rotation      ?Knee flexion      ?Knee extension      ?Ankle dorsiflexion 0/10    ?Ankle plantarflexion 56    ?Ankle inversion 39    ?Ankle eversion 10/14    ? (Blank rows = not tested) ?  ?LE STRENGTH: ?  ?MMT Right ?02/01/2022 Left ?02/01/2022  ?Hip flexion      ?Hip extension      ?Hip abduction      ?Hip adduction      ?Hip internal rotation      ?Hip external rotation      ?Knee flexion      ?Knee extension      ?Ankle dorsiflexion Tol min reistance    ?Ankle plantarflexion Tol min resistance with pain    ?Ankle inversion 4/5    ?Ankle eversion 4-/5    ? (Blank rows = not tested) ?  ?  ?  ?GAIT: ?Assistive device utilized:  3 pronged cane ?Comments: Pt favors R LE with increased stance time on L LE.  Pt has decreased Wb'ing thru R LE. Pt has decreased DF and foot clearance with gait.  Pt feels "pulling" in ankle with gait.   ?  ?  ?  ?TODAY'S TREATMENT: ?Pt seen for aquatic therapy today.  Treatment took place in water 3.5 ft in depth at the [place]. Temp of water was 91?.  Pt entered/exited the pool via stairs independently with bilat rail. ? ?Forward walking holding onto yellow noodle x 5 min for warm up.  ?Pool closed due to elevated chlorine levels ? ?On pool deck:  ?Manual therapy- IASTM / STM to Rt calf and tissue surrounding Achilles tendon to decrease fascial restrictions and improve ROM/mobility.  ?Reg KT tape applied from Rt metatarsal heads, over calcaneus up to mid (central) gastroc. Perpendicular strips placed over arch, calcaneus, over incision.  I strip applied to Rt ant tib (ankle to prox tibia) - to provide increased proprioception, decompress  tissue.  ? ?  ?PATIENT EDUCATION:  ?Education details:  ?Person  educated: Patient ?Education method: Explanation ?Education comprehension: verbalized understanding ?  ?  ?HOME EXERCISE PROGRAM: ?[id number] ?  ?ASSESSMENT: ?  ?CLINICAL IMPRESSION: ?Pt was able to get in water for 5 min prior to the pool being shut down for elevated chlorine levels.  She continues to have palpable restrictions in Rt post calf; is point tender with IASTM but tolerated it well.  She reported reduction of pain with application of ktape at end of session.   Pt should benefit from skilled PT services including aquatic therapy to address goals, improve ROM and strength, and to assist in improving overall function.       ?  ?  ?OBJECTIVE IMPAIRMENTS Abnormal gait, decreased activity tolerance, decreased endurance, decreased mobility, difficulty walking, decreased ROM, decreased strength, hypomobility, impaired flexibility, and pain.  ?  ?ACTIVITY LIMITATIONS cleaning, community activity, meal prep, and ambulation .  ?  ?PERSONAL FACTORS 3+ comorbidities: Rheumatoid syndrome per pt, Osteoporosis, Fibromyalgia, Raynaud's disease, DDD lumbar spine and sciatica,  are also affecting patient's functional outcome.  ?  ?  ?REHAB POTENTIAL: Good ?  ?CLINICAL DECISION MAKING: Evolving/moderate complexity ?  ?EVALUATION COMPLEXITY: Moderate ?  ?  ?GOALS: ?  ?  ?SHORT TERM GOALS: Target date: 03/01/2022 ?  ?Pt will be independent with HEP.  ?Baseline: ?Goal status: MET ?Target date: 01/14/2022 ?2.  Pt will improve FOTO to > = 52 to demo improved functional mobility ?Baseline:  ?Goal status: NOT MET ?Target date: 03/01/2022 ?3.  Pt will tolerate gait x 50 ft without AD with pain < = 3/10 to improve household mobility.   ?Baseline:  ?Goal status: NOT MET ?Target date:  03/01/2022 ?  ?4.  Pt will ambulate with improved Wb'ing and increased stance time on R LE and no > than a minimal limp   ?Baseline:  ?Goal status: INITIAL ?Target date:  03/01/2022 ?  ?  ?  ?LONG TERM GOALS: Target date: 03/15/2022 ?  ?Pt will improve R ankle  strength to 4/5 to tolerate gait and mobility ?Baseline:  ?Goal status: IN PROGRESS ?  ?2.  Pt will be able to ambulate community distance without significant pain or difficulty.  ?Baseline:  ?Goal status: INITIAL ?  ?

## 2022-02-16 ENCOUNTER — Ambulatory Visit (HOSPITAL_BASED_OUTPATIENT_CLINIC_OR_DEPARTMENT_OTHER): Payer: 59 | Admitting: Physical Therapy

## 2022-02-22 ENCOUNTER — Encounter (HOSPITAL_BASED_OUTPATIENT_CLINIC_OR_DEPARTMENT_OTHER): Payer: Self-pay | Admitting: Physical Therapy

## 2022-02-22 ENCOUNTER — Ambulatory Visit (HOSPITAL_BASED_OUTPATIENT_CLINIC_OR_DEPARTMENT_OTHER): Payer: 59 | Attending: Podiatry | Admitting: Physical Therapy

## 2022-02-22 DIAGNOSIS — M25571 Pain in right ankle and joints of right foot: Secondary | ICD-10-CM | POA: Insufficient documentation

## 2022-02-22 DIAGNOSIS — M6281 Muscle weakness (generalized): Secondary | ICD-10-CM | POA: Diagnosis present

## 2022-02-22 DIAGNOSIS — R262 Difficulty in walking, not elsewhere classified: Secondary | ICD-10-CM | POA: Diagnosis present

## 2022-02-22 NOTE — Progress Notes (Signed)
? ?Follow Up Note ? ?RE: Laura Fields MRN: [id number] DOB: 1961/04/06 ?Date of Office Visit: 02/23/2022 ? ?Referring provider: Olive Bass,* ?Primary care provider: Olive Bass, FNP ? ?Chief Complaint: Asthma (Doing better. Has only used Pulmicort neb and tessalon once since last visit. ) ? ?History of Present Illness: ?I had the pleasure of seeing Laura Fields for a follow up visit at the [facility] on 02/23/2022. She is a 61 y.o. female, who is being followed for asthma on Tezspire, allergic rhinoconjunctivitis, atopic dermatitis and GERD. Her previous allergy office visit was on 01/26/2022 with Dr. Selena Batten. Today is a regular follow up visit. ? ?Severe persistent asthma ?Currently on Tezspire injections every 4 weeks today with no issues. ?Uses Breztri 2 puffs twice a day with space and rinsing mouth after each use.  ? ?Only had to use nebulizer once and tessalon perles once since the last visit. ? ?Denies any ER/urgent care visits or prednisone use since the last visit. ? ?Seasonal allergic rhinitis due to pollen ?Currently on Xyzal, nasal sprays and eye drops with some benefit. ?Interested in starting AIT. ?  ?Other atopic dermatitis ?Controlled. ?  ?Gastroesophageal reflux disease ?Stable. ? ?Assessment and Plan: ?Laura Fields is a 61 y.o. female with: ?Severe persistent asthma without complication ?Past history - Persistent symptoms with coughing, mucous. Mild benefit with systemic steroids but does not like to take. [place]ves[place] showed no benefit. Last CXR in November 2022. Started Tezspire in March 2023 ?Interim history -  Doing much better and only had to use nebulizer once since the last visit.  ?Today's spirometry was normal.  ?Tezspire injection given today - continue every 4 weeks.  ?Daily controller medication(s): continue Breztri 2 puffs twice a day with spacer and rinse mouth afterwards. ?Continue Singulair (montelukast) 10mg  daily at night. ?During upper respiratory  infections/flares:  ?Start Pulmicort nebulizer twice a day for 1-2 weeks until your breathing symptoms return to baseline.  ?Pretreat with albuterol 2 puffs or albuterol nebulizer.  ?If you need to use your albuterol nebulizer machine back to back within 15-30 minutes with no relief then please go to the ER/urgent care for further evaluation.  ?May use albuterol rescue inhaler 2 puffs or nebulizer every 4 to 6 hours as needed for shortness of breath, chest tightness, coughing, and wheezing. May use albuterol rescue inhaler 2 puffs 5 to 15 minutes prior to strenuous physical activities. Monitor frequency of use.  ?Get spirometry at next visit. ? ?Seasonal allergic rhinitis due to pollen ?Past history - 2021 skin testing was positive to grass, ragweed, weed. ?Interim history - symptoms flaring and wants to start AIT. ?Continue environmental control measures as below.  ?Take Xyzal 5 mg once a day as needed for a runny nose or itch. May take twice a day during flares. ?Use azelastine nasal spray 1-2 sprays per nostril twice a day as needed for runny nose/drainage. ?Use flunisolide nasal spray 1-2 sprays per nostril 2-3 times a day as needed for nasal congestion.  ?Nasal saline spray (i.e., Simply Saline) or nasal saline lavage (i.e., NeilMed) is recommended as needed and prior to medicated nasal sprays. ?Continue epinastine eye drops 1 drop in each eye twice a day as needed for red, itchy eyes. ?Start allergy injections. ?Had a detailed discussion with patient/family that clinical history is suggestive of allergic rhinitis, and may benefit from allergy immunotherapy (AIT). Discussed in detail regarding the dosing, schedule, side effects (mild to moderate local allergic reaction and rarely systemic allergic  reactions including anaphylaxis), and benefits (significant improvement in nasal symptoms, seasonal flares of asthma) of immunotherapy with the patient. There is significant time commitment involved with allergy  shots, which includes weekly immunotherapy injections for first 9-12 months and then biweekly to monthly injections for 3-5 years. Consent was signed. ?I have prescribed epinephrine injectable and demonstrated proper use. For mild symptoms you can take over the counter antihistamines such as Benadryl and monitor symptoms closely. If symptoms worsen or if you have severe symptoms including breathing issues, throat closure, significant swelling, whole body hives, severe diarrhea and vomiting, lightheadedness then inject epinephrine and seek immediate medical care afterwards.Emergency action plan given. ? ?Allergic conjunctivitis of both eyes ?See assessment and plan as above. ? ?Other atopic dermatitis ?Well-controlled.  ?Continue proper skin care.  ?Continue desonide 0.05% ointment to red, itchy areas twice a day as needed. Do not use this medication longer than 3 weeks in a row.  ? ?Gastroesophageal reflux disease ?Past history - Normal EGD in February 2023. ?Managed by GI. ? ?Return in about 3 months (around 05/26/2022). ? ?Meds ordered this encounter  ?Medications  ? EPINEPHrine 0.3 mg/0.3 mL IJ SOAJ injection  ?  Sig: Inject 0.3 mg into the muscle as needed for anaphylaxis.  ?  Dispense:  1 each  ?  Refill:  2  ?  May dispense generic/Mylan/Teva brand.  ? Budeson-Glycopyrrol-Formoterol (BREZTRI AEROSPHERE) 160-9-4.8 MCG/ACT AERO  ?  Sig: Inhale 2 puffs into the lungs in the morning and at bedtime. with spacer and rinse mouth afterwards.  ?  Dispense:  10.7 g  ?  Refill:  3  ? ?Lab Orders  ?No laboratory test(s) ordered today  ? ? ?Diagnostics: ?Spirometry:  ?Tracings reviewed. Her effort: Good reproducible efforts. ?FVC: 2.44L ?FEV1: 2.00L, 93% predicted ?FEV1/FVC ratio: 82% ?Interpretation: Spirometry consistent with normal pattern.  ?Please see scanned spirometry results for details. ? ?Medication List:  ?Current Outpatient Medications  ?Medication Sig Dispense Refill  ? albuterol (PROVENTIL) (2.5 MG/3ML) 0.083%  nebulizer solution Take 3 mLs (2.5 mg total) by nebulization every 4 (four) hours as needed for wheezing or shortness of breath (coughing fits). 75 mL 1  ? albuterol (VENTOLIN HFA) 108 (90 Base) MCG/ACT inhaler INHALE 2 PUFFS INTO THE LUNGS EVERY 4 HOURS AS NEEDED FOR WHEEZE OR FOR SHORTNESS OF BREATH 18 each 0  ? AMBULATORY NON FORMULARY MEDICATION Apply 1 application topically at bedtime. Medication Name: Compounded Hydroquinone 8%, Tretinoin 0.025%, Kojic Acid 1%, Niacinamide 4%, Fluocinolone 0.025% Cream (S[employer] Medic[employer]) 30 g 0  ? azelastine (ASTELIN) 0.1 % nasal spray Place 2 sprays into both nostrils 2 (two) times daily as needed for rhinitis. 30 mL 5  ? baclofen (LIORESAL) 10 MG tablet Take 10 mg by mouth 3 (three) times daily.    ? benzonatate (TESSALON) 100 MG capsule Take 2 capsules (200 mg total) by mouth 3 (three) times daily as needed for cough. 60 capsule 0  ? Budeson-Glycopyrrol-Formoterol (BREZTRI AEROSPHERE) 160-9-4.8 MCG/ACT AERO Inhale 2 puffs into the lungs in the morning and at bedtime. with spacer and rinse mouth afterwards. 10.7 g 3  ? budesonide (PULMICORT) 0.5 MG/2ML nebulizer solution Take 2 mLs (0.5 mg total) by nebulization in the morning and at bedtime. For the next 2 weeks and use during asthma flares. 120 mL 2  ? cyclobenzaprine (FLEXERIL) 5 MG tablet Take 5 mg by mouth 3 (three) times daily as needed for muscle spasms. At night time    ? desonide (DESOWEN) 0.05 % ointment APPLY  1 APPLICATION TOPICALLY 2 TIMES DAILY AS NEEDED TO RED ITCHY AREAS TO THE FACE. 45 g 0  ? DULoxetine (CYMBALTA) 60 MG capsule Take 60 mg by mouth daily.    ? Epinastine HCl 0.05 % ophthalmic solution Apply to eye as needed.    ? EPINEPHrine 0.3 mg/0.3 mL IJ SOAJ injection Inject 0.3 mg into the muscle as needed for anaphylaxis. 1 each 2  ? famotidine (PEPCID) 20 MG tablet TAKE 1 TABLET BY MOUTH TWICE A DAY 180 tablet 0  ? flunisolide (NASALIDE) 25 MCG/ACT (0.025%) SOLN 2 SPRAYS PER NOSTRIL 2-3 TIMES PER DAY AS  NEEDED FOR STUFFY NOSE. 75 mL 0  ? gabapentin (NEURONTIN) 100 MG capsule Take 1 capsule (100 mg total) by mouth 3 (three) times daily. 90 capsule 3  ? hydrOXYzine (ATARAX/VISTARIL) 25 MG tablet Take 1 tablet (25

## 2022-02-22 NOTE — Therapy (Signed)
?OUTPATIENT PHYSICAL THERAPY TREATMENT NOTE ? ? ?Patient Name: Laura Fields ?MRN: [id number] ?DOB:1961-02-09, 61 y.o., female ?Today's Date: 02/22/2022 ? ?PCP: Marrian Salvage, [place] ?REFERRING PROVIDER: Marrian Salvage,* ? ?END OF SESSION:  ? PT End of Session - 02/22/22 0726   ? ? Visit Number 21   ? Number of Visits 31   ? Date for PT Re-Evaluation 03/15/22   ? PT Start Time 0710   ? PT Stop Time 0758   ? PT Time Calculation (min) 48 min   ? Activity Tolerance Patient tolerated treatment well   ? Behavior During Therapy [facility] for tasks assessed/performed   ? ?  ?  ? ?  ? ? ?Past Medical History:  ?Diagnosis Date  ? Allergy   ? Anxiety   ? Arthritis   ? Asthma   ? Depression   ? Fibromyalgia   ? Intrinsic atopic dermatitis 12/01/2020  ? Osteoporosis   ? Raynaud's disease   ? Sciatica   ? ?Past Surgical History:  ?Procedure Laterality Date  ? ABDOMINAL HYSTERECTOMY    ? ACHILLES TENDON REPAIR Right 07/02/2021  ? AUGMENTATION MAMMAPLASTY Bilateral 1999  ? Saline/ under muscle  ? back nerve    ? block  ? COLONOSCOPY  09/23/2013  ? [facility]  ? colonoscopy with endo  11/30/2021  ? CYST REMOVAL NECK Left 12/23/2021  ? GASTRIC BYPASS    ? KNEE ARTHROSCOPY    ? mass removable    ? on thumb  ? STOMACH SURGERY    ? ?Patient Active Problem List  ? Diagnosis Date Noted  ? Severe persistent asthma without complication 30/06/2329  ? Seasonal allergic rhinitis due to pollen 12/29/2021  ? Hand cramps 11/26/2021  ? Left hand weakness 10/11/2021  ? Numbness and tingling in left hand 09/02/2021  ? Gastroesophageal reflux disease 07/28/2021  ? Other atopic dermatitis 12/01/2020  ? Pruritic rash 02/12/2020  ? Allergic conjunctivitis of both eyes 02/12/2020  ? ?REFERRING PROVIDER: Garrel Ridgel, DPM ?  ?REFERRING DIAG: M76.61 (ICD-10-CM) - Achilles tendinitis of right lower extremity  ?           Q76.226 (ICD-10-CM) - Status post foot surgery  ?THERAPY DIAG:  ?Pain in right ankle and joints of right foot ?  ?Muscle weakness  (generalized) ?  ?Difficulty in walking, not elsewhere classified ?  ?ONSET DATE: Surgery 07/02/2021 ?  ?SUBJECTIVE:  ?  ?SUBJECTIVE STATEMENT: ?Pt reporting increase in pain and tightness in Rt calf.  She reports she has been wearing her boot in the evening.  ? ?  ?PERTINENT HISTORY: ?-gastroc recession Achilles tenolysis (tendon repair) spur resection (07/02/2021) ?-Rheumatoid syndrome per pt, Osteoporosis, Fibromyalgia, Raynaud's disease, DDD lumbar spine and sciatica, and L knee and LE surgery has rods in distal LE LE  ?  ?PAIN:  ?Are you having pain? Yes ?9/10 ?Location: Post Rt ankle / calf ?  ?PRECAUTIONS: Other: achilles tendon repair and gastroc recession surgery, osteoporosis, raynaud's disease.  Pt ambulating with cane. ?  ?WEIGHT BEARING RESTRICTIONS WBAT ?  ?FALLS:  ?Has patient fallen in last 6 months? Yes, 3 falls.  Pt states the top of her foot gave out.  Last fall less than 1 month ago.   ?  ?LIVING ENVIRONMENT: ?Lives with: lives alone ?Lives in: House/apartment ?Stairs: 1 story home with 3 steps and bilat rails  ?Has following equipment at home:  CAM walker boot and 3 pronged cane ?  ?OCCUPATION: Pt is on long term disability ?  ?  PLOF: Independent Pt reports she used a boot for 2.5 years prior to surgery.  Pt ambulated without a cane.  Pt able to ambulate and perform functional mobility with less pain and increased ease. ?  ?PATIENT GOALS  Pt wants to be able to travel.  Improve ambulation and walk without cane ?  ?  ?OBJECTIVE:  * Findings taken at [facility] unless otherwise noted.   ?  ?PATIENT SURVEYS:  ?FOTO 25 with a goal of 55 at visit #17 ?  ?COGNITION: ?          Overall cognitive status: Within functional limits for tasks assessed               ?           ?OBSERVATION:   ?          Pt has a small incision on L sided cervical which is closed.  Incisions in distal R LE are well healed ?  ?  ?PALPATION: ?TTP at R achilles > R calf ?  ?LE ROM: ?  ?AROM/PROM Right ?02/01/2022 Left ?02/01/2022  ?Hip  flexion      ?Hip extension      ?Hip abduction      ?Hip adduction      ?Hip internal rotation      ?Hip external rotation      ?Knee flexion      ?Knee extension      ?Ankle dorsiflexion 0/10    ?Ankle plantarflexion 56    ?Ankle inversion 39    ?Ankle eversion 10/14    ? (Blank rows = not tested) ?  ?LE STRENGTH: ?  ?MMT Right ?02/01/2022 Left ?02/01/2022  ?Hip flexion      ?Hip extension      ?Hip abduction      ?Hip adduction      ?Hip internal rotation      ?Hip external rotation      ?Knee flexion      ?Knee extension      ?Ankle dorsiflexion Tol min reistance    ?Ankle plantarflexion Tol min resistance with pain    ?Ankle inversion 4/5    ?Ankle eversion 4-/5    ? (Blank rows = not tested) ?  ?  ?  ?GAIT: ?Assistive device utilized:  3 pronged cane ?Comments: Pt favors R LE with increased stance time on L LE.  Pt has decreased Wb'ing thru R LE. Pt has decreased DF and foot clearance with gait.  Pt feels "pulling" in ankle with gait.   ?  ?  ?  ?TODAY'S TREATMENT: ?Pt seen for aquatic therapy today.  Treatment took place in water 3.25-4.0 ft in depth at the [employer] pool. Temp of water was 91?.  Pt entered/exited the pool via stairs independently with bilat rail. ? ?Multiple laps of slow forward and backward walking, and side stepping.  Focus on rolling through foot/ toes, even stance time and weight shift. ?Box walking (4 steps each direction)  ?Holding onto wall:  foot taps crossing behind the other (couldn't tolerate without steady UE support); Heel / toe raises x 10; squats x 10; squat followed by alternating hip abdct x 10 each ?At Stairs:  forward step ups x 10 RLE; Lateral step ups x 10 RLE;  Forward step downs L/ retro step ups R x 10  ?Holding yellow noodle:  High knee marching forward, quick short steps backwards, forward prancing. ? ?Manual:  I strip of sport KT tape applied  over incision in Rt calf down to metatarsal heads with 25% stretch; perpendicular strips applied over incision,  Achillies, and arch of foot with 50% stretch for extra decompression of tissue.   ? ? Pt spent 5 min in hot tub prior to tape application (unbilled time) for massage to RLE with jets.  ? ?PATIENT EDUCATION:  ?Education details:  ?Person educated: Patient ?Education method: Explanation ?Education comprehension: verbalized understanding ?  ?  ?HOME EXERCISE PROGRAM: ?[id number] ?  ?ASSESSMENT: ?  ?CLINICAL IMPRESSION: ?Pt required minor cues to allow Rt foot/knee to flex during gait in pool.  She moved in guarded manner throughout session; gradually improved with time. As session progressed, she reported reduction of tightness in forefoot and ankle along with reduction of pain to 4/10.  She continues to report relief with application of ktape to calf/Achilles; reapplied today at end of session.   Pt should benefit from skilled PT services including aquatic therapy to address goals, improve ROM and strength, and to assist in improving overall function.       ?  ?  ?OBJECTIVE IMPAIRMENTS Abnormal gait, decreased activity tolerance, decreased endurance, decreased mobility, difficulty walking, decreased ROM, decreased strength, hypomobility, impaired flexibility, and pain.  ?  ?ACTIVITY LIMITATIONS cleaning, community activity, meal prep, and ambulation .  ?  ?PERSONAL FACTORS 3+ comorbidities: Rheumatoid syndrome per pt, Osteoporosis, Fibromyalgia, Raynaud's disease, DDD lumbar spine and sciatica,  are also affecting patient's functional outcome.  ?  ?  ?REHAB POTENTIAL: Good ?  ?CLINICAL DECISION MAKING: Evolving/moderate complexity ?  ?EVALUATION COMPLEXITY: Moderate ?  ?  ?GOALS: ?  ?  ?SHORT TERM GOALS: Target date: 03/01/2022 ?  ?Pt will be independent with HEP.  ?Baseline: ?Goal status: MET ?Target date: 01/14/2022 ?2.  Pt will improve FOTO to > = 52 to demo improved functional mobility ?Baseline:  ?Goal status: NOT MET ?Target date: 03/01/2022 ?3.  Pt will tolerate gait x 50 ft without AD with pain < = 3/10 to improve  household mobility.   ?Baseline:  ?Goal status: NOT MET ?Target date:  03/01/2022 ?  ?4.  Pt will ambulate with improved Wb'ing and increased stance time on R LE and no > than a minimal limp   ?Baseline:  ?Goal

## 2022-02-23 ENCOUNTER — Ambulatory Visit: Payer: 59

## 2022-02-23 ENCOUNTER — Encounter: Payer: Self-pay | Admitting: Allergy

## 2022-02-23 ENCOUNTER — Ambulatory Visit (HOSPITAL_BASED_OUTPATIENT_CLINIC_OR_DEPARTMENT_OTHER): Payer: 59 | Admitting: Physical Therapy

## 2022-02-23 ENCOUNTER — Ambulatory Visit: Payer: 59 | Admitting: Allergy

## 2022-02-23 VITALS — BP 102/72 | HR 78 | Temp 97.9°F | Resp 16

## 2022-02-23 DIAGNOSIS — K219 Gastro-esophageal reflux disease without esophagitis: Secondary | ICD-10-CM | POA: Diagnosis not present

## 2022-02-23 DIAGNOSIS — J301 Allergic rhinitis due to pollen: Secondary | ICD-10-CM

## 2022-02-23 DIAGNOSIS — H1013 Acute atopic conjunctivitis, bilateral: Secondary | ICD-10-CM | POA: Diagnosis not present

## 2022-02-23 DIAGNOSIS — J4550 Severe persistent asthma, uncomplicated: Secondary | ICD-10-CM | POA: Diagnosis not present

## 2022-02-23 DIAGNOSIS — L2089 Other atopic dermatitis: Secondary | ICD-10-CM

## 2022-02-23 MED ORDER — EPINEPHRINE 0.3 MG/0.3ML IJ SOAJ: 0.3000 mg | INTRAMUSCULAR | 2 refills | Status: DC | PRN

## 2022-02-23 MED ORDER — BREZTRI AEROSPHERE 160-9-4.8 MCG/ACT IN AERO: 2.0000 | INHALATION_SPRAY | Freq: Two times a day (BID) | RESPIRATORY_TRACT | 3 refills | Status: DC

## 2022-02-23 NOTE — Assessment & Plan Note (Signed)
.   See assessment and plan as above. 

## 2022-02-23 NOTE — Assessment & Plan Note (Signed)
Well-controlled.  ?? Continue proper skin care.  ?? Continue desonide 0.05% ointment to red, itchy areas twice a day as needed. Do not use this medication longer than 3 weeks in a row.  ?

## 2022-02-23 NOTE — Progress Notes (Signed)
VIALS EXP 02-24-23 ?

## 2022-02-23 NOTE — Patient Instructions (Addendum)
Asthma ?Tezspire injection given today - continue every 4 weeks.  ?Daily controller medication(s): continue Breztri 2 puffs twice a day with spacer and rinse mouth afterwards.  ?Continue Singulair (montelukast) 10mg  daily at night. ?During upper respiratory infections/flares:  ?Start Pulmicort nebulizer twice a day for 1-2 weeks until your breathing symptoms return to baseline.  ?Pretreat with albuterol 2 puffs or albuterol nebulizer.  ?If you need to use your albuterol nebulizer machine back to back within 15-30 minutes with no relief then please go to the ER/urgent care for further evaluation.  ?May use albuterol rescue inhaler 2 puffs or nebulizer every 4 to 6 hours as needed for shortness of breath, chest tightness, coughing, and wheezing. May use albuterol rescue inhaler 2 puffs 5 to 15 minutes prior to strenuous physical activities. Monitor frequency of use.  ?Asthma control goals:  ?Full participation in all desired activities (may need albuterol before activity) ?Albuterol use two times or less a week on average (not counting use with activity) ?Cough interfering with sleep two times or less a month ?Oral steroids no more than once a year ?No hospitalizations  ? ?Allergic rhinitis ?2021 skin testing was positive to grass, ragweed, weed. ?Continue environmental control measures as below.  ?Take Xyzal 5 mg once a day as needed for a runny nose or itch. May take twice a day during flares. ?Use azelastine nasal spray 1-2 sprays per nostril twice a day as needed for runny nose/drainage. ?Use flunisolide nasal spray 1-2 sprays per nostril 2-3 times a day as needed for nasal congestion.  ?Nasal saline spray (i.e., Simply Saline) or nasal saline lavage (i.e., NeilMed) is recommended as needed and prior to medicated nasal sprays. ? ?Continue epinastine eye drops 1 drop in each eye twice a day as needed for red, itchy eyes. ?Start allergy injections. ?Had a detailed discussion with patient/family that clinical history  is suggestive of allergic rhinitis, and may benefit from allergy immunotherapy (AIT). Discussed in detail regarding the dosing, schedule, side effects (mild to moderate local allergic reaction and rarely systemic allergic reactions including anaphylaxis), and benefits (significant improvement in nasal symptoms, seasonal flares of asthma) of immunotherapy with the patient. There is significant time commitment involved with allergy shots, which includes weekly immunotherapy injections for first 9-12 months and then biweekly to monthly injections for 3-5 years. Consent was signed. ?I have prescribed epinephrine injectable and demonstrated proper use. For mild symptoms you can take over the counter antihistamines such as Benadryl and monitor symptoms closely. If symptoms worsen or if you have severe symptoms including breathing issues, throat closure, significant swelling, whole body hives, severe diarrhea and vomiting, lightheadedness then inject epinephrine and seek immediate medical care afterwards.Emergency action plan given.  ? ?Atopic dermatitis ?Continue a daily moisturizing routine ?Continue desonide 0.05% ointment to red, itchy areas twice a day as needed. Do not use this medication longer than 3 weeks in a row.  ? ?Follow up in 3 month or sooner if needed. ?Follow up in 2-3 weeks for first allergy shot.  ? ?Reducing Pollen Exposure ?Pollen seasons: trees (spring), grass (summer) and ragweed/weeds (fall). ?Keep windows closed in your home and car to lower pollen exposure.  ?Install air conditioning in the bedroom and throughout the house if possible.  ?Avoid going out in dry windy days - especially early morning. ?Pollen counts are highest between 5 - 10 AM and on dry, hot and windy days.  ?Save outside activities for late afternoon or after a heavy rain, when pollen levels are  lower.  ?Avoid mowing of grass if you have grass pollen allergy. ?Be aware that pollen can also be transported indoors on people and  pets.  ?Dry your clothes in an automatic dryer rather than hanging them outside where they might collect pollen.  ?Rinse hair and eyes before bedtime. ? ?

## 2022-02-23 NOTE — Assessment & Plan Note (Signed)
Past history - 2021 skin testing was positive to grass, ragweed, weed. ?Interim history - symptoms flaring and wants to start AIT. ?? Continue environmental control measures as below.  ?? Take Xyzal 5 mg once a day as needed for a runny nose or itch. May take twice a day during flares. ?? Use azelastine nasal spray 1-2 sprays per nostril twice a day as needed for runny nose/drainage. ?? Use flunisolide nasal spray 1-2 sprays per nostril 2-3 times a day as needed for nasal congestion.  ?? Nasal saline spray (i.e., Simply Saline) or nasal saline lavage (i.e., NeilMed) is recommended as needed and prior to medicated nasal sprays. ?? Continue epinastine eye drops 1 drop in each eye twice a day as needed for red, itchy eyes. ?? Start allergy injections. ?? Had a detailed discussion with patient/family that clinical history is suggestive of allergic rhinitis, and may benefit from allergy immunotherapy (AIT). Discussed in detail regarding the dosing, schedule, side effects (mild to moderate local allergic reaction and rarely systemic allergic reactions including anaphylaxis), and benefits (significant improvement in nasal symptoms, seasonal flares of asthma) of immunotherapy with the patient. There is significant time commitment involved with allergy shots, which includes weekly immunotherapy injections for first 9-12 months and then biweekly to monthly injections for 3-5 years. Consent was signed. ?? I have prescribed epinephrine injectable and demonstrated proper use. For mild symptoms you can take over the counter antihistamines such as Benadryl and monitor symptoms closely. If symptoms worsen or if you have severe symptoms including breathing issues, throat closure, significant swelling, whole body hives, severe diarrhea and vomiting, lightheadedness then inject epinephrine and seek immediate medical care afterwards.Emergency action plan given. ?

## 2022-02-23 NOTE — Progress Notes (Signed)
Aeroallergen Immunotherapy  ? ?Ordering Provider: Dr. Wyline Mood  ? ?Patient Details  ?Name: Laura Fields  ?MRN: [id number]  ?Date of Birth: Mar 22, 1961  ? ?Order 1 of 1  ? ?Vial Label: [id number]  ? ?0.3 ml (Volume)  BAU Concentration -- 7 Grass Mix* 100,000 ([address] [place], [place], [place], [place], [place], [place], [place])  ?0.3 ml (Volume)  1:20 Concentration -- Ragweed Mix  ?0.5 ml (Volume)  1:20 Concentration -- Weed Mix*  ? ? ?1.1  ml Extract Subtotal  ?3.9  ml Diluent  ?5.0  ml Maintenance Total  ? ?Schedule:  B  ?Blue Vial (1:100,000): Schedule B (6 doses)  ?Yellow Vial (1:10,000): Schedule B (6 doses)  ?Green Vial (1:1,000): Schedule B (6 doses)  ?Red Vial (1:100): Schedule A (14 doses)  ? ?Special Instructions: once per week ?

## 2022-02-23 NOTE — Assessment & Plan Note (Addendum)
Past history - Persistent symptoms with coughing, mucous. Mild benefit with systemic steroids but does not like to take. [place] showed no benefit. Last CXR in November 2022. Started Tezspire in March 2023 ?Interim history -  Doing much better and only had to use nebulizer once since the last visit.  ?? Today's spirometry was normal.  ?? Tezspire injection given today - continue every 4 weeks.  ?? Daily controller medication(s): continue Breztri 2 puffs twice a day with spacer and rinse mouth afterwards. ?o Continue Singulair (montelukast) 10mg  daily at night. ?? During upper respiratory infections/flares:  ?o Start Pulmicort nebulizer twice a day for 1-2 weeks until your breathing symptoms return to baseline.  ?o Pretreat with albuterol 2 puffs or albuterol nebulizer.  ?o If you need to use your albuterol nebulizer machine back to back within 15-30 minutes with no relief then please go to the ER/urgent care for further evaluation.  ?? May use albuterol rescue inhaler 2 puffs or nebulizer every 4 to 6 hours as needed for shortness of breath, chest tightness, coughing, and wheezing. May use albuterol rescue inhaler 2 puffs 5 to 15 minutes prior to strenuous physical activities. Monitor frequency of use.  ?? Get spirometry at next visit. ?

## 2022-02-23 NOTE — Assessment & Plan Note (Signed)
Past history - Normal EGD in February 2023. ?? Managed by GI. ?

## 2022-02-24 DIAGNOSIS — J302 Other seasonal allergic rhinitis: Secondary | ICD-10-CM

## 2022-03-01 ENCOUNTER — Ambulatory Visit (HOSPITAL_BASED_OUTPATIENT_CLINIC_OR_DEPARTMENT_OTHER): Payer: 59 | Admitting: Physical Therapy

## 2022-03-01 ENCOUNTER — Encounter (HOSPITAL_BASED_OUTPATIENT_CLINIC_OR_DEPARTMENT_OTHER): Payer: Self-pay | Admitting: Physical Therapy

## 2022-03-01 DIAGNOSIS — R262 Difficulty in walking, not elsewhere classified: Secondary | ICD-10-CM

## 2022-03-01 DIAGNOSIS — M25571 Pain in right ankle and joints of right foot: Secondary | ICD-10-CM | POA: Diagnosis not present

## 2022-03-01 DIAGNOSIS — M6281 Muscle weakness (generalized): Secondary | ICD-10-CM

## 2022-03-01 NOTE — Therapy (Signed)
?OUTPATIENT PHYSICAL THERAPY TREATMENT NOTE ? ? ?Patient Name: Laura Fields ?MRN: [id number] ?DOB:June 14, 1961, 61 y.o., female ?Today's Date: 03/01/2022 ? ?PCP: Marrian Salvage, [place] ?REFERRING PROVIDER: Marrian Salvage,* ? ?END OF SESSION:  ? PT End of Session - 03/01/22 0736   ? ? Visit Number 22   ? Number of Visits 31   ? Date for PT Re-Evaluation 03/15/22   ? PT Start Time 0715   ? PT Stop Time 0800   ? PT Time Calculation (min) 45 min   ? Activity Tolerance Patient tolerated treatment well   ? Behavior During Therapy [facility] for tasks assessed/performed   ? ?  ?  ? ?  ? ? ?Past Medical History:  ?Diagnosis Date  ? Allergy   ? Anxiety   ? Arthritis   ? Asthma   ? Depression   ? Fibromyalgia   ? Intrinsic atopic dermatitis 12/01/2020  ? Osteoporosis   ? Raynaud's disease   ? Sciatica   ? ?Past Surgical History:  ?Procedure Laterality Date  ? ABDOMINAL HYSTERECTOMY    ? ACHILLES TENDON REPAIR Right 07/02/2021  ? AUGMENTATION MAMMAPLASTY Bilateral 1999  ? Saline/ under muscle  ? back nerve    ? block  ? COLONOSCOPY  09/23/2013  ? [facility]  ? colonoscopy with endo  11/30/2021  ? CYST REMOVAL NECK Left 12/23/2021  ? GASTRIC BYPASS    ? KNEE ARTHROSCOPY    ? mass removable    ? on thumb  ? STOMACH SURGERY    ? ?Patient Active Problem List  ? Diagnosis Date Noted  ? Severe persistent asthma without complication [id number]  ? Seasonal allergic rhinitis due to pollen 12/29/2021  ? Hand cramps 11/26/2021  ? Left hand weakness 10/11/2021  ? Numbness and tingling in left hand 09/02/2021  ? Gastroesophageal reflux disease 07/28/2021  ? Other atopic dermatitis 12/01/2020  ? Pruritic rash 02/12/2020  ? Allergic conjunctivitis of both eyes 02/12/2020  ? ?REFERRING PROVIDER: Garrel Ridgel, DPM ?  ?REFERRING DIAG: M76.61 (ICD-10-CM) - Achilles tendinitis of right lower extremity  ?           K80.034 (ICD-10-CM) - Status post foot surgery  ?THERAPY DIAG:  ?Pain in right ankle and joints of right foot ?  ?Muscle weakness  (generalized) ?  ?Difficulty in walking, not elsewhere classified ?  ?ONSET DATE: Surgery 07/02/2021 ?  ?SUBJECTIVE:  ?  ?SUBJECTIVE STATEMENT: ?Pt reports she had a good day of relief after last session.  She states that sitting in the hot tub afterwards also helped.  ? ?  ?PERTINENT HISTORY: ?-gastroc recession Achilles tenolysis (tendon repair) spur resection (07/02/2021) ?-Rheumatoid syndrome per pt, Osteoporosis, Fibromyalgia, Raynaud's disease, DDD lumbar spine and sciatica, and L knee and LE surgery has rods in distal LE LE  ?  ?PAIN:  ?Are you having pain? Yes ?6/10 "not too much" ?Location: Post Rt ankle / calf ?  ?PRECAUTIONS: Other: achilles tendon repair and gastroc recession surgery, osteoporosis, raynaud's disease.  Pt ambulating with cane. ?  ?WEIGHT BEARING RESTRICTIONS WBAT ?  ?FALLS:  ?Has patient fallen in last 6 months? Yes, 3 falls.  Pt states the top of her foot gave out.  Last fall less than 1 month ago.   ?  ?LIVING ENVIRONMENT: ?Lives with: lives alone ?Lives in: House/apartment ?Stairs: 1 story home with 3 steps and bilat rails  ?Has following equipment at home:  CAM walker boot and 3 pronged cane ?  ?OCCUPATION: Pt is on  long term disability ?  ?PLOF: Independent Pt reports she used a boot for 2.5 years prior to surgery.  Pt ambulated without a cane.  Pt able to ambulate and perform functional mobility with less pain and increased ease. ?  ?PATIENT GOALS  Pt wants to be able to travel.  Improve ambulation and walk without cane ?  ?  ?OBJECTIVE:  * Findings taken at [facility] unless otherwise noted.   ?  ?PATIENT SURVEYS:  ?FOTO 25 with a goal of 55 at visit #17 ?  ?COGNITION: ?          Overall cognitive status: Within functional limits for tasks assessed               ?           ?OBSERVATION:   ?          Pt has a small incision on L sided cervical which is closed.  Incisions in distal R LE are well healed ?  ?  ?PALPATION: ?TTP at R achilles > R calf ?  ?LE ROM: ?  ?AROM/PROM Right ?02/01/2022  Left ?02/01/2022  ?Hip flexion      ?Hip extension      ?Hip abduction      ?Hip adduction      ?Hip internal rotation      ?Hip external rotation      ?Knee flexion      ?Knee extension      ?Ankle dorsiflexion 0/10    ?Ankle plantarflexion 56    ?Ankle inversion 39    ?Ankle eversion 10/14    ? (Blank rows = not tested) ?  ?LE STRENGTH: ?  ?MMT Right ?02/01/2022 Left ?02/01/2022  ?Hip flexion      ?Hip extension      ?Hip abduction      ?Hip adduction      ?Hip internal rotation      ?Hip external rotation      ?Knee flexion      ?Knee extension      ?Ankle dorsiflexion Tol min reistance    ?Ankle plantarflexion Tol min resistance with pain    ?Ankle inversion 4/5    ?Ankle eversion 4-/5    ? (Blank rows = not tested) ?  ?  ?  ?GAIT: ?Assistive device utilized:  3 pronged cane ?Comments: Pt favors R LE with increased stance time on L LE.  Pt has decreased Wb'ing thru R LE. Pt has decreased DF and foot clearance with gait.  Pt feels "pulling" in ankle with gait.   ?  ?  ?  ?TODAY'S TREATMENT: ?Pt seen for aquatic therapy today.  Treatment took place in water 3.25-4.0 ft in depth at the [facility] pool. Temp of water was 91?.  Pt entered/exited the pool via stairs independently with bilat rail. ? ?Seated on bench in water:  R LAQ with foot DF;  ankle circles, ankle inversion/ eversion ?Weight shifts side to side and front to back with hands on yellow noodle; cues for straight knee when shifting onto LE ?Multiple laps of slow forward and backward walking, and side stepping.  Focus on rolling through foot/ toes, even stance time and weight shift. ?Holding onto wall: Heel / toe raises x 25; hip abdct x 10 each leg;  foot taps crossing behind the other x 10 each foot ; braiding R/L; squats x 10 ?Holding yellow noodle:  High knee marching forward, quick short steps backwards ?Box walking (4 steps  each direction)  ?At Stairs:  forward step ups x 5 RLE; Lateral step ups x 8 RLE ? ?Manual:  I strip of standard KT  tape applied over incision in Rt calf down to calcaneus with 25% stretch; perpendicular strips applied over incision in calf and incision on calcaneus with 50% stretch for extra decompression of tissue.   ? ? Pt spent 5 min in hot tub prior to tape application (unbilled time) for massage to RLE with jets.  ? ?PATIENT EDUCATION:  ?Education details:  ?Person educated: Patient ?Education method: Explanation ?Education comprehension: verbalized understanding ?  ?  ?HOME EXERCISE PROGRAM: ?[id number] ?  ?ASSESSMENT: ?  ?CLINICAL IMPRESSION: ?Pt required minor cues to decreased Lt step length and straighten Rt knee with WB.  As session progressed, she reported reduction of tightness in foot and ankle along with reduction of pain to 3/10. Good carry over of normalized gait after exiting the water.  She continues to report relief with application of ktape to calf/Achilles; reapplied today at end of session.   Pt should benefit from skilled PT services including aquatic therapy to address goals, improve ROM and strength, and to assist in improving overall function.       ?  ?  ?OBJECTIVE IMPAIRMENTS Abnormal gait, decreased activity tolerance, decreased endurance, decreased mobility, difficulty walking, decreased ROM, decreased strength, hypomobility, impaired flexibility, and pain.  ?  ?ACTIVITY LIMITATIONS cleaning, community activity, meal prep, and ambulation .  ?  ?PERSONAL FACTORS 3+ comorbidities: Rheumatoid syndrome per pt, Osteoporosis, Fibromyalgia, Raynaud's disease, DDD lumbar spine and sciatica,  are also affecting patient's functional outcome.  ?  ?  ?REHAB POTENTIAL: Good ?  ?CLINICAL DECISION MAKING: Evolving/moderate complexity ?  ?EVALUATION COMPLEXITY: Moderate ?  ?  ?GOALS: ?  ?  ?SHORT TERM GOALS: Target date: 03/01/2022 ?  ?Pt will be independent with HEP.  ?Baseline: ?Goal status: MET ?Target date: 01/14/2022 ?2.  Pt will improve FOTO to > = 52 to demo improved functional mobility ?Baseline:  ?Goal status:  NOT MET ?Target date: 03/01/2022 ?3.  Pt will tolerate gait x 50 ft without AD with pain < = 3/10 to improve household mobility.   ?Baseline:  ?Goal status: NOT MET ?Target date:  03/01/2022 ?  ?4.  Pt will

## 2022-03-02 ENCOUNTER — Encounter (HOSPITAL_BASED_OUTPATIENT_CLINIC_OR_DEPARTMENT_OTHER): Payer: Self-pay

## 2022-03-02 ENCOUNTER — Ambulatory Visit (HOSPITAL_BASED_OUTPATIENT_CLINIC_OR_DEPARTMENT_OTHER): Payer: 59 | Admitting: Physical Therapy

## 2022-03-07 ENCOUNTER — Encounter (HOSPITAL_BASED_OUTPATIENT_CLINIC_OR_DEPARTMENT_OTHER): Payer: Self-pay | Admitting: Physical Therapy

## 2022-03-07 ENCOUNTER — Ambulatory Visit (HOSPITAL_BASED_OUTPATIENT_CLINIC_OR_DEPARTMENT_OTHER): Payer: 59 | Admitting: Physical Therapy

## 2022-03-07 DIAGNOSIS — M25571 Pain in right ankle and joints of right foot: Secondary | ICD-10-CM | POA: Diagnosis not present

## 2022-03-07 DIAGNOSIS — M6281 Muscle weakness (generalized): Secondary | ICD-10-CM

## 2022-03-07 DIAGNOSIS — R262 Difficulty in walking, not elsewhere classified: Secondary | ICD-10-CM

## 2022-03-07 NOTE — Therapy (Signed)
?OUTPATIENT PHYSICAL THERAPY TREATMENT NOTE ? ? ?Patient Name: Laura Fields ?MRN: [id number] ?DOB:1961/05/08, 61 y.o., female ?Today's Date: 03/07/2022 ? ? PT End of Session - 03/07/22 0728   ? ? Visit Number 23   ? Number of Visits 31   ? Date for PT Re-Evaluation 03/15/22   ? PT Start Time [phone]   ? PT Stop Time 0755   ? PT Time Calculation (min) 42 min   ? Activity Tolerance Patient limited by pain   ? Behavior During Therapy [facility] for tasks assessed/performed   ? ?  ?  ? ?  ? ? ?Past Medical History:  ?Diagnosis Date  ? Allergy   ? Anxiety   ? Arthritis   ? Asthma   ? Depression   ? Fibromyalgia   ? Intrinsic atopic dermatitis 12/01/2020  ? Osteoporosis   ? Raynaud's disease   ? Sciatica   ? ?Past Surgical History:  ?Procedure Laterality Date  ? ABDOMINAL HYSTERECTOMY    ? ACHILLES TENDON REPAIR Right 07/02/2021  ? AUGMENTATION MAMMAPLASTY Bilateral 1999  ? Saline/ under muscle  ? back nerve    ? block  ? COLONOSCOPY  09/23/2013  ? [facility]  ? colonoscopy with endo  11/30/2021  ? CYST REMOVAL NECK Left 12/23/2021  ? GASTRIC BYPASS    ? KNEE ARTHROSCOPY    ? mass removable    ? on thumb  ? STOMACH SURGERY    ? ?Patient Active Problem List  ? Diagnosis Date Noted  ? Severe persistent asthma without complication [id number]  ? Seasonal allergic rhinitis due to pollen 12/29/2021  ? Hand cramps 11/26/2021  ? Left hand weakness 10/11/2021  ? Numbness and tingling in left hand 09/02/2021  ? Gastroesophageal reflux disease 07/28/2021  ? Other atopic dermatitis 12/01/2020  ? Pruritic rash 02/12/2020  ? Allergic conjunctivitis of both eyes 02/12/2020  ? ?REFERRING PROVIDER: Garrel Ridgel, DPM ?  ?REFERRING DIAG: M76.61 (ICD-10-CM) - Achilles tendinitis of right lower extremity  ?           [id number] (ICD-10-CM) - Status post foot surgery  ?THERAPY DIAG:  ?Pain in right ankle and joints of right foot ?  ?Muscle weakness (generalized) ?  ?Difficulty in walking, not elsewhere classified ?  ?ONSET DATE: Surgery 07/02/2021 ?   ?SUBJECTIVE:  ?  ?SUBJECTIVE STATEMENT: ?Pt reports that her Lt knee began to hurt yesterday.  She reports overall increase in pain today. "Everything hurts" ? ?PERTINENT HISTORY: ?-gastroc recession Achilles tenolysis (tendon repair) spur resection (07/02/2021) ?-Rheumatoid syndrome per pt, Osteoporosis, Fibromyalgia, Raynaud's disease, DDD lumbar spine and sciatica, and L knee and LE surgery has rods in distal LE LE  ?  ?PAIN:  ?Are you having pain? Yes ?9/10  ?Location: Post Rt ankle / calf; Lt knee  ?  ?PRECAUTIONS: Other: achilles tendon repair and gastroc recession surgery, osteoporosis, raynaud's disease.  Pt ambulating with cane. ?  ?WEIGHT BEARING RESTRICTIONS WBAT ?  ?FALLS:  ?Has patient fallen in last 6 months? Yes, 3 falls.  Pt states the top of her foot gave out.  Last fall less than 1 month ago.   ?  ?LIVING ENVIRONMENT: ?Lives with: lives alone ?Lives in: House/apartment ?Stairs: 1 story home with 3 steps and bilat rails  ?Has following equipment at home:  CAM walker boot and 3 pronged cane ?  ?OCCUPATION: Pt is on long term disability ?  ?PLOF: Independent Pt reports she used a boot for 2.5 years prior to surgery.  Pt  ambulated without a cane.  Pt able to ambulate and perform functional mobility with less pain and increased ease. ?  ?PATIENT GOALS  Pt wants to be able to travel.  Improve ambulation and walk without cane ?  ?  ?OBJECTIVE:  * Findings taken at [facility] unless otherwise noted.   ?  ?PATIENT SURVEYS:  ?FOTO 25 with a goal of 55 at visit #17 ?  ?COGNITION: ?          Overall cognitive status: Within functional limits for tasks assessed               ?           ?OBSERVATION:   ?          Pt has a small incision on L sided cervical which is closed.  Incisions in distal R LE are well healed ?  ?  ?PALPATION: ?TTP at R achilles > R calf ?  ?LE ROM: ?  ?AROM/PROM Right ?02/01/2022 Left ?02/01/2022  ?Hip flexion      ?Hip extension      ?Hip abduction      ?Hip adduction      ?Hip internal rotation       ?Hip external rotation      ?Knee flexion      ?Knee extension      ?Ankle dorsiflexion 0/10    ?Ankle plantarflexion 56    ?Ankle inversion 39    ?Ankle eversion 10/14    ? (Blank rows = not tested) ?  ?LE STRENGTH: ?  ?MMT Right ?02/01/2022 Left ?02/01/2022  ?Hip flexion      ?Hip extension      ?Hip abduction      ?Hip adduction      ?Hip internal rotation      ?Hip external rotation      ?Knee flexion      ?Knee extension      ?Ankle dorsiflexion Tol min reistance    ?Ankle plantarflexion Tol min resistance with pain    ?Ankle inversion 4/5    ?Ankle eversion 4-/5    ? (Blank rows = not tested) ?  ?  ?  ?GAIT: ?Assistive device utilized:  3 pronged cane ?Comments: Pt favors R LE with increased stance time on L LE.  Pt has decreased Wb'ing thru R LE. Pt has decreased DF and foot clearance with gait.  Pt feels "pulling" in ankle with gait.   ?  ?  ?  ?TODAY'S TREATMENT: ?Pt seen for aquatic therapy today.  Treatment took place in water 3.25-4.0 ft in depth at the [employer] pool. Temp of water was 91?.  Pt entered/exited the pool via stairs independently with bilat rail. ? ?Seated on bench in water:  R LAQ with foot DF;  ankle circles, ankle inversion/ eversion; sit to/from stand x 10 ?Holding onto wall: squats x 10;  Lt / Rt quad stretch with LE supported by square noodle x 3 reps each; Heel / toe raises x 10: ?Weight shifts side to side holding yellow noodle ?Multiple laps of slow forward and backward walking, and side stepping.  Focus on rolling through foot/ toes, even stance time and weight shift. ?Reciprocal pattern forward/ backward 6 steps with bilat rail; repeated 1x with forward descending.  ?Side step right x 10 reps ?Holding wall: forward kicks with LAQ ?Holding yellow noodle:  High knee marching forward, quick short steps backwards;  box step ?Box walking (4 steps each direction)  ?At  Stairs:  forward step ups x 5 RLE; Lateral step ups x 8 RLE; bilat gastroc stretch (heels off of step) x  10s x 2 ?Seated on yellow noodle for decompression of LEs - gently balancing and bouncing ? ?Manual:  I strip of standard KT tape applied over incision in Rt calf down to calcaneus with 25% stretch; perpendicular strips applied over incision in calf and incision on calcaneus with 50% stretch for extra decompression of tissue.   ? ? Pt spent 5 min in hot tub prior to tape application (unbilled time) for massage to RLE with jets.  ? ?PATIENT EDUCATION:  ?Education details:  ?Person educated: Patient ?Education method: Explanation ?Education comprehension: verbalized understanding ?  ?  ?HOME EXERCISE PROGRAM: ?[id number] ?  ?ASSESSMENT: ?  ?CLINICAL IMPRESSION: ?Pt arrived with elevated pain in BLE.  Lt quad and Rt calf are tight and tender with stretch in water.  Limited tolerance for exercises today; required increased time until tightness/ pain eased up with exercises performed at 4 ft depth (sternum level).  She continues to require minor cues to decreased Lt step length and straighten bilat knee with WB.    She continues to report relief with application of ktape to calf/Achilles; reapplied today at end of session.  Encouraged pt to stop Lt side stepping up her stairs and start working on reciprocal pattern up/down with UE support.  Pt should benefit from skilled PT services including aquatic therapy to address goals, improve ROM and strength, and to assist in improving overall function.       ?  ?  ?OBJECTIVE IMPAIRMENTS Abnormal gait, decreased activity tolerance, decreased endurance, decreased mobility, difficulty walking, decreased ROM, decreased strength, hypomobility, impaired flexibility, and pain.  ?  ?ACTIVITY LIMITATIONS cleaning, community activity, meal prep, and ambulation .  ?  ?PERSONAL FACTORS 3+ comorbidities: Rheumatoid syndrome per pt, Osteoporosis, Fibromyalgia, Raynaud's disease, DDD lumbar spine and sciatica,  are also affecting patient's functional outcome.  ?  ?  ?REHAB POTENTIAL: Good ?   ?CLINICAL DECISION MAKING: Evolving/moderate complexity ?  ?EVALUATION COMPLEXITY: Moderate ?  ?  ?GOALS: ?  ?  ?SHORT TERM GOALS: Target date: 03/01/2022 ?  ?Pt will be independent with HEP.  ?Baseline: ?Goal

## 2022-03-09 ENCOUNTER — Ambulatory Visit (HOSPITAL_BASED_OUTPATIENT_CLINIC_OR_DEPARTMENT_OTHER): Payer: 59 | Admitting: Physical Therapy

## 2022-03-09 ENCOUNTER — Encounter (HOSPITAL_BASED_OUTPATIENT_CLINIC_OR_DEPARTMENT_OTHER): Payer: Self-pay | Admitting: Physical Therapy

## 2022-03-09 DIAGNOSIS — M25571 Pain in right ankle and joints of right foot: Secondary | ICD-10-CM

## 2022-03-09 DIAGNOSIS — M6281 Muscle weakness (generalized): Secondary | ICD-10-CM

## 2022-03-09 DIAGNOSIS — R262 Difficulty in walking, not elsewhere classified: Secondary | ICD-10-CM

## 2022-03-09 NOTE — Therapy (Addendum)
?OUTPATIENT PHYSICAL THERAPY TREATMENT NOTE ? ? ?Patient Name: Laura Fields ?MRN: [id number] ?DOB:1961-09-02, 61 y.o., female ?Today's Date: 03/09/2022 ? ? PT End of Session - 03/09/22 1329   ? ? Visit Number 24   ? Number of Visits 31   ? Date for PT Re-Evaluation 03/15/22   ? PT Start Time 1327   ? PT Stop Time 1410   ? PT Time Calculation (min) 43 min   ? Activity Tolerance Patient limited by pain   ? Behavior During Therapy [facility] for tasks assessed/performed   ? ?  ?  ? ?  ? ? ?Past Medical History:  ?Diagnosis Date  ? Allergy   ? Anxiety   ? Arthritis   ? Asthma   ? Depression   ? Fibromyalgia   ? Intrinsic atopic dermatitis 12/01/2020  ? Osteoporosis   ? Raynaud's disease   ? Sciatica   ? ?Past Surgical History:  ?Procedure Laterality Date  ? ABDOMINAL HYSTERECTOMY    ? ACHILLES TENDON REPAIR Right 07/02/2021  ? AUGMENTATION MAMMAPLASTY Bilateral 1999  ? Saline/ under muscle  ? back nerve    ? block  ? COLONOSCOPY  09/23/2013  ? [facility]  ? colonoscopy with endo  11/30/2021  ? CYST REMOVAL NECK Left 12/23/2021  ? GASTRIC BYPASS    ? KNEE ARTHROSCOPY    ? mass removable    ? on thumb  ? STOMACH SURGERY    ? ?Patient Active Problem List  ? Diagnosis Date Noted  ? Severe persistent asthma without complication 89/21/1941  ? Seasonal allergic rhinitis due to pollen 12/29/2021  ? Hand cramps 11/26/2021  ? Left hand weakness 10/11/2021  ? Numbness and tingling in left hand 09/02/2021  ? Gastroesophageal reflux disease 07/28/2021  ? Other atopic dermatitis 12/01/2020  ? Pruritic rash 02/12/2020  ? Allergic conjunctivitis of both eyes 02/12/2020  ? ?REFERRING PROVIDER: Garrel Ridgel, DPM ?  ?REFERRING DIAG: M76.61 (ICD-10-CM) - Achilles tendinitis of right lower extremity  ?           D40.814 (ICD-10-CM) - Status post foot surgery  ?THERAPY DIAG:  ?Pain in right ankle and joints of right foot ?  ?Muscle weakness (generalized) ?  ?Difficulty in walking, not elsewhere classified ?  ?ONSET DATE: Surgery 07/02/2021 ?   ?SUBJECTIVE:  ?  ?SUBJECTIVE STATEMENT: ?Pt reports that her Lt knee continues to hurt.  She reports she has been taking Gabapentin 3x/day as prescribed and that she ends up staying in bed due to its effects.  "I feel like I'm going backwards". She reports persistent tightness in Rt ankle/ calf.  She wears boot in afternoons when pain increases.  ? ?PERTINENT HISTORY: ?-gastroc recession Achilles tenolysis (tendon repair) spur resection (07/02/2021) ?-Rheumatoid syndrome per pt, Osteoporosis, Fibromyalgia, Raynaud's disease, DDD lumbar spine and sciatica, and L knee and LE surgery has rods in distal LE LE  ?  ?PAIN:  ?Are you having pain? Yes ?8/10  ?Location: Post Rt ankle / calf; Lt knee  ?  ?PRECAUTIONS: Other: achilles tendon repair and gastroc recession surgery, osteoporosis, raynaud's disease.  Pt ambulating with cane. ?  ?WEIGHT BEARING RESTRICTIONS WBAT ?  ?FALLS:  ?Has patient fallen in last 6 months? Yes, 3 falls.  Pt states the top of her foot gave out.  Last fall less than 1 month ago.   ?  ?LIVING ENVIRONMENT: ?Lives with: lives alone ?Lives in: House/apartment ?Stairs: 1 story home with 3 steps and bilat rails  ?Has following equipment  at home:  CAM walker boot and 3 pronged cane ?  ?OCCUPATION: Pt is on long term disability ?  ?PLOF: Independent Pt reports she used a boot for 2.5 years prior to surgery.  Pt ambulated without a cane.  Pt able to ambulate and perform functional mobility with less pain and increased ease. ?  ?PATIENT GOALS  Pt wants to be able to travel.  Improve ambulation and walk without cane ?  ?  ?OBJECTIVE:  * Findings taken at [facility] unless otherwise noted.   ?  ?PATIENT SURVEYS:  ?FOTO 25 with a goal of 55 at visit #17 ?03/09/22:  47  ?  ?COGNITION: ?          Overall cognitive status: Within functional limits for tasks assessed               ?           ?OBSERVATION:   ?          Pt has a small incision on L sided cervical which is closed.  Incisions in distal R LE are well healed ?   ?  ?PALPATION: ?TTP at R achilles > R calf ?  ?LE ROM: ?  ?AROM/PROM Right ?02/01/2022  Right ?03/09/2022  ?Hip flexion       ?Hip extension       ?Hip abduction       ?Hip adduction       ?Hip internal rotation       ?Hip external rotation       ?Knee flexion       ?Knee extension       ?Ankle dorsiflexion 0/10  5?/8?   ?Ankle plantarflexion 56     ?Ankle inversion 39  37? AROM   ?Ankle eversion 10/14  16? AROM   ? (Blank rows = not tested) ?  ?LE STRENGTH: ?  ?MMT Right ?02/01/2022 Right ?03/09/22  ?Hip flexion      ?Hip extension      ?Hip abduction      ?Hip adduction      ?Hip internal rotation      ?Hip external rotation      ?Knee flexion      ?Knee extension      ?Ankle dorsiflexion Tol min reistance 4-/5 with pain   ?Ankle plantarflexion Tol min resistance with pain  not tested  ?Ankle inversion 4/5  4-/5  ?Ankle eversion 4-/5 4/5   ? (Blank rows = not tested) ?  ?  ?  ?GAIT: ?Assistive device utilized:  3 pronged cane ?Comments: Pt favors R LE with increased stance time on L LE.  Pt has decreased Wb'ing thru R LE. Pt has decreased DF and foot clearance with gait.  Pt feels "pulling" in ankle with gait.   ?  ?  ?  ?TODAY'S TREATMENT: ?NuStep L4: 6 min for ROM and warm up ?Reciprocal pattern up/ down  ? ?Manual:  IASTM / STM to Rt gastroc/ soleus to decrease fascial retrictions and improve mobility.    ?I strip of sensitive skin Rock tape applied over incision in Rt calf down to calcaneus with 25% stretch; perpendicular strips applied over incision in calf and incision on calcaneus with 50% stretch for extra decompression of tissue.   ? ?While prone: Rt ankle circles, pumps;  Lt/ Rt quad stretch x 60 seconds ?Supine: Rt gastroc stretch with strap x 60 sec  ?Gait: Forward gait with SPC; Cues for cane placement;  ?Forward holding  rail: cues for decreased Rt step length and increased L heel strike.  ? Backward gait  ?STS with L foot forward x 10  ?R SLS with UE support x 10 sec  ? ?PATIENT EDUCATION:  ?Education  details:  ?Person educated: Patient ?Education method: Explanation ?Education comprehension: verbalized understanding ?  ?  ?HOME EXERCISE PROGRAM: ?[id number] ?  ?ASSESSMENT: ?  ?CLINICAL IMPRESSION: ?Pt's Rt ankle ROM similar to last assessment.  Rt ankle strength improving.  She continues to have limp with gait and significant pain with stairs. She has greater tolerance for exercises in pool than on land.   She continues to report relief with application of ktape to calf/Achilles; reapplied today at beginning of session.  Encouraged pt to start using RLE to ascend her stairs, to allow her Lt knee to calm down. Pt has partially met her goals.  Pt should benefit from skilled PT services including aquatic therapy to address goals, improve ROM and strength, and to assist in improving overall function.       ?  ?  ?OBJECTIVE IMPAIRMENTS Abnormal gait, decreased activity tolerance, decreased endurance, decreased mobility, difficulty walking, decreased ROM, decreased strength, hypomobility, impaired flexibility, and pain.  ?  ?ACTIVITY LIMITATIONS cleaning, community activity, meal prep, and ambulation .  ?  ?PERSONAL FACTORS 3+ comorbidities: Rheumatoid syndrome per pt, Osteoporosis, Fibromyalgia, Raynaud's disease, DDD lumbar spine and sciatica,  are also affecting patient's functional outcome.  ?  ?  ?REHAB POTENTIAL: Good ?  ?CLINICAL DECISION MAKING: Evolving/moderate complexity ?  ?EVALUATION COMPLEXITY: Moderate ?  ?  ?GOALS: ?  ?  ?SHORT TERM GOALS: Target date: 03/01/2022 ?  ?Pt will be independent with HEP.  ?Baseline: ?Goal status: MET ?Target date: 01/14/2022 ?2.  Pt will improve FOTO to > = 52 to demo improved functional mobility ?Baseline: 47 -03/09/22 ?Goal status: In progress  ?Target date: 03/01/2022 ?3.  Pt will tolerate gait x 50 ft without AD with pain < = 3/10 to improve household mobility.   ?Baseline:  ?Goal status: NOT MET ?Target date:  03/01/2022 ?  ?4.  Pt will ambulate with improved Wb'ing and  increased stance time on R LE and no > than a minimal limp   ?Baseline:  ?Goal status: Partially met  ?Target date:  03/01/2022 ?  ?  ?  ?LONG TERM GOALS: Target date: 05/04/2022 ? ?  ?Pt will improve R ankle strength

## 2022-03-09 NOTE — Addendum Note (Signed)
Addended by: Marijo Sanes on: 03/09/2022 05:20 PM ? ? Modules accepted: Orders ? ?

## 2022-03-10 ENCOUNTER — Ambulatory Visit: Payer: 59 | Admitting: Podiatry

## 2022-03-10 DIAGNOSIS — S86011S Strain of right Achilles tendon, sequela: Secondary | ICD-10-CM | POA: Diagnosis not present

## 2022-03-10 MED ORDER — OXYCODONE-ACETAMINOPHEN 10-325 MG PO TABS: 1.0000 | ORAL_TABLET | Freq: Three times a day (TID) | ORAL | 0 refills | Status: AC | PRN

## 2022-03-10 NOTE — Progress Notes (Addendum)
She presents today for a follow-up of her pain to her right lower leg status post a gastroc recession Achilles tendon repair date of surgery 07/02/2021.  She states that the still has swelling of the leg she presents today with Kinesiotape on the leg.  States that the only physical therapy that seems to help is they water therapy.  Objective: Vital signs are stable she is alert and oriented x3 pulses are palpable.  She does present with her cam boot today and a cane to the right lower extremity with the Kinesiotape intact.  Once removed demonstrates flaccid gastrosoleus complex but tenderness on palpation of the Achilles there is a little plantarflexion against resistance of the Achilles.  But there is no erythema edema salines drainage or odor in this area.  I am curious to know whether there is another tear in this area that may be preventing her from ambulating or walking on this.  Her skin does demonstrate an allodynic type pain and severe pain with spasms on muscular effort.  Assessment most likely she has either a tear of the Achilles is going to need to be repaired or she has a CRPS that may end up having be treated with Dr. Lorrine Kin at neurosurgery.  Plan: At this point and requesting another MRI to this right lower extremity for reevaluation of this Achilles repair.  I also would like to increase her time with water therapy to 3-4 times a week, which I feel is medically necessary for continued postoperative care and and healing.  Also I am going to provide her with more pain medication.  And I will follow-up with her once her MRI comes back.

## 2022-03-11 ENCOUNTER — Ambulatory Visit (HOSPITAL_BASED_OUTPATIENT_CLINIC_OR_DEPARTMENT_OTHER): Payer: 59 | Admitting: Physical Therapy

## 2022-03-16 ENCOUNTER — Ambulatory Visit (HOSPITAL_BASED_OUTPATIENT_CLINIC_OR_DEPARTMENT_OTHER): Payer: Self-pay | Admitting: Physical Therapy

## 2022-03-17 ENCOUNTER — Other Ambulatory Visit (HOSPITAL_BASED_OUTPATIENT_CLINIC_OR_DEPARTMENT_OTHER): Payer: Self-pay | Admitting: Family

## 2022-03-17 DIAGNOSIS — Z1231 Encounter for screening mammogram for malignant neoplasm of breast: Secondary | ICD-10-CM

## 2022-03-18 ENCOUNTER — Ambulatory Visit (HOSPITAL_BASED_OUTPATIENT_CLINIC_OR_DEPARTMENT_OTHER): Payer: 59 | Admitting: Physical Therapy

## 2022-03-20 ENCOUNTER — Other Ambulatory Visit: Payer: Self-pay | Admitting: Gastroenterology

## 2022-03-22 ENCOUNTER — Ambulatory Visit (HOSPITAL_BASED_OUTPATIENT_CLINIC_OR_DEPARTMENT_OTHER): Payer: Self-pay | Admitting: Physical Therapy

## 2022-03-22 ENCOUNTER — Other Ambulatory Visit: Payer: Self-pay | Admitting: Family Medicine

## 2022-03-23 ENCOUNTER — Other Ambulatory Visit: Payer: Self-pay | Admitting: Allergy

## 2022-03-23 ENCOUNTER — Ambulatory Visit (HOSPITAL_BASED_OUTPATIENT_CLINIC_OR_DEPARTMENT_OTHER): Payer: 59 | Admitting: Physical Therapy

## 2022-03-23 ENCOUNTER — Ambulatory Visit (INDEPENDENT_AMBULATORY_CARE_PROVIDER_SITE_OTHER): Payer: 59

## 2022-03-23 DIAGNOSIS — J309 Allergic rhinitis, unspecified: Secondary | ICD-10-CM

## 2022-03-23 NOTE — Progress Notes (Signed)
Immunotherapy   Patient Details  Name: Laura Fields MRN: [id number] Date of Birth: Aug 07, 1961  03/23/2022  [name] started injections for Blue 1:100,000( G-RW-W) Following schedule: B  Frequency:1 time per week Epi-Pen:Epi-Pen Available  Consent signed and patient instructions given.   [name] [name] 03/23/2022, 11:10 AM

## 2022-03-24 ENCOUNTER — Ambulatory Visit (INDEPENDENT_AMBULATORY_CARE_PROVIDER_SITE_OTHER): Payer: 59

## 2022-03-24 DIAGNOSIS — J4550 Severe persistent asthma, uncomplicated: Secondary | ICD-10-CM

## 2022-03-25 ENCOUNTER — Encounter: Payer: Self-pay | Admitting: Physical Medicine and Rehabilitation

## 2022-03-25 ENCOUNTER — Encounter: Payer: 59 | Attending: Physical Medicine and Rehabilitation | Admitting: Physical Medicine and Rehabilitation

## 2022-03-25 VITALS — BP 128/90 | HR 90 | Ht 64.0 in | Wt 169.4 lb

## 2022-03-25 DIAGNOSIS — G894 Chronic pain syndrome: Secondary | ICD-10-CM | POA: Insufficient documentation

## 2022-03-25 DIAGNOSIS — M792 Neuralgia and neuritis, unspecified: Secondary | ICD-10-CM | POA: Insufficient documentation

## 2022-03-25 DIAGNOSIS — G90521 Complex regional pain syndrome I of right lower limb: Secondary | ICD-10-CM | POA: Insufficient documentation

## 2022-03-25 MED ORDER — TIZANIDINE HCL 4 MG PO TABS: 2.0000 mg | ORAL_TABLET | Freq: Two times a day (BID) | ORAL | 5 refills | Status: DC

## 2022-03-25 MED ORDER — PREGABALIN 150 MG PO CAPS: 150.0000 mg | ORAL_CAPSULE | Freq: Every day | ORAL | 5 refills | Status: DC

## 2022-03-25 MED ORDER — DULOXETINE HCL 30 MG PO CPEP: 30.0000 mg | ORAL_CAPSULE | Freq: Every day | ORAL | 5 refills | Status: DC

## 2022-03-25 MED ORDER — CYCLOBENZAPRINE HCL 10 MG PO TABS: 10.0000 mg | ORAL_TABLET | Freq: Every evening | ORAL | 5 refills | Status: DC | PRN

## 2022-03-25 MED ORDER — TRAZODONE HCL 50 MG PO TABS: 50.0000 mg | ORAL_TABLET | Freq: Every day | ORAL | 5 refills | Status: DC

## 2022-03-25 NOTE — Patient Instructions (Signed)
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 Here for evaluation of "15/10" pain.  First, start with Lyrica- Start Lyrica 150 mg nightly x 3-4 days then 150 mg 2x/day x 3-4 days then 150 mg 3x/day- (if cannot take 3x/day, take 1 pill in Am and 2 pills at night)  2. Cymbalta/Duloxetine- 30 mg  daily x 3 days then  increase to 60 mg daily- for nerve pain- START this 2-3 days AFTER Lyrica. After 3-4 weeks, on Cymbalta,  can increase to 120 mg daily, but you need to call to do so.    3. Zanaflex/Tizanidine-  2-4 mg2x/day for muscle spasms.  Take SCHEDULED. Can lower BP a small amount  4. Can also have 10 mg tab- Flexeril (had 5 mg tabs before) to take nightly AS NEEDED for spasms.    5. Can wean off Gabapentin- if tolerate din the next 2-3 weeks.   6. Can take the first day- Sleep- Trazodone- 50 mg nightly for sleep- can increase by 25 mg at a time to a max of 100 mg nightly for sleep- can also increase in future if needed to a max of 300 mg nightly in future if needed.   7. F/U - call me in 3-4 weeks to let me know how things are going.   8. Wait on going over Fibromyalgia issues for today-   9 F/U in 3 months.

## 2022-03-25 NOTE — Progress Notes (Signed)
Subjective:    Patient ID: Laura Fields, female    DOB: 1961-02-28, 61 y.o.   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 Here for evaluation of "15/10" pain.   Has L knee surgery with 5 rods- had a meniscal tear and "went downhill from there".   Has seen Dr Laurian Brim- had radiofrequency ablation of L4-L5 is "bad". Lasted 3 weeks had total of 4- last was done 18 months ago.   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  Has MRI scheduled 6/6 for R ankle- to see if has a tear   Has been out of Cymbalta and Lyrica- 4 months.   Previous EMG/NCS were negative per Dr Georgia Duff note- , but pt says has B/L carpal tunnel syndrome-  Has locking of 1-3rd digits on B/L hands- L>R  Had cyst removed by Derm on back of neck.   Feels like she's at 10/10-  Sharp, tingling, burning-burning. On fire.   Was sent here by PCP- Ms Dayton Scrape NP.     Doesn't sleep at night- due to pain. Up all night.   Dr Al Corpus- Podiatry-  when had surgery- got Oxy for pain last 9/22.  Tried:  On Gabapentin 100 mg TID. No side effects Also having muscle spasms- Flexeril at night Baclofen- 10 mg in AM- can take 20 mg in AM.  Depends on severity of weather and pain-  Meloxicam- 15 mg daily.   Used to be on Lyrica- was on 150 mg- BID- no side effects Cymbalta 60 mg daily.  Has attempted to use Lidocaine patches- no improvement.  Pants are SO irritating to her skin.    Pain Inventory Average Pain 10 Pain Right Now 10 My pain is sharp, burning, stabbing, tingling, and aching  In the last 24 hours, has pain interfered with the following? General activity 10 Relation with others 10 Enjoyment of life 10 What TIME of day is your pain at its worst? morning  and night Sleep  (in general) Poor  Pain is worse with: walking, bending, sitting, standing, and some activites Pain improves with: rest, heat/ice, and therapy/exercise Relief from Meds: 7  walk with assistance use a cane how many minutes can you walk? 2-4 ability to climb steps?  no do you drive?  no  not employed: date last employed 8/22 disabled: date disabled 8/22 I need assistance with the following:  household duties and shopping  weakness numbness tingling trouble walking spasms  New pt  New pt    Family History  Problem Relation Age of Onset   Hypertension Mother    Stomach cancer Other    Rectal cancer Other    Esophageal cancer Other    Colon polyps Other    Colon cancer Other    Allergic rhinitis Neg Hx    Angioedema Neg Hx    Asthma Neg Hx    Eczema Neg Hx    Immunodeficiency Neg Hx    Urticaria Neg Hx    Social History   Socioeconomic History   Marital status: Single    Spouse name: Not on file   Number of children: Not on file   Years of education: Not on file   Highest education level: Not on file  Occupational History   Not on file  Tobacco Use   Smoking status: Never    Passive exposure: Yes   Smokeless tobacco: Never  Vaping Use   Vaping Use: Never used  Substance and Sexual Activity   Alcohol use: Yes    Comment: monthly or less   Drug use: Never   Sexual activity: Yes  Other Topics Concern   Not on file  Social History Narrative   Not on file   Social Determinants of Health   Financial Resource Strain: Not on file  Food Insecurity: Not on file  Transportation Needs: Not on file  Physical Activity: Not on file  Stress: Not on file  Social Connections: Not on file   Past Surgical History:  Procedure Laterality Date   ABDOMINAL HYSTERECTOMY     ACHILLES TENDON REPAIR Right 07/02/2021   AUGMENTATION MAMMAPLASTY Bilateral 1999   Saline/ under muscle   back nerve     block   [facility]  [facility]   colonoscopy with  endo  11/30/2021   CYST REMOVAL NECK Left 12/23/2021   GASTRIC BYPASS     KNEE ARTHROSCOPY     mass removable     on thumb   STOMACH SURGERY     Past Medical History:  Diagnosis Date   Allergy    Anxiety    Arthritis    Asthma    Depression    Fibromyalgia    Intrinsic atopic dermatitis 12/01/2020   Osteoporosis    Raynaud's disease    Sciatica    BP 128/90   Pulse 90   Ht 5\' 4"  (1.626 m)   Wt 169 lb 6.4 oz (76.8 kg)   SpO2 95%   BMI 29.08 kg/m   Opioid Risk Score:   Fall Risk Score:  `1  [facility]     03/25/2022   11:36 AM 08/03/2021   10:24 AM 03/16/2021    1:52 PM  Depression screen PHQ 2/9  Decreased Interest 0 0 0  Down, Depressed, Hopeless 1 0 0  PHQ - 2 Score 1 0 0  Altered sleeping 3    Tired, decreased energy 3    Change in appetite 0    Feeling bad or failure about yourself  0    Trouble concentrating 0    Moving slowly or fidgety/restless 0    Suicidal thoughts 0    PHQ-9 Score 7    Difficult doing work/chores Extremely dIfficult        Review of Systems  Musculoskeletal:  Positive for back pain, gait problem and myalgias.       Knee pain  Right lower leg pain  Neurological:  Positive for weakness and numbness.  All other systems reviewed and are negative.     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 Here for evaluation of "15/10" pain.  First, start with Lyrica- Start Lyrica 150 mg nightly x 3-4 days then 150 mg 2x/day x 3-4 days then 150 mg 3x/day- (if cannot take  3x/day, take 1 pill in Am and 2 pills at  night)  2. Cymbalta/Duloxetine- 30 mg  daily x 3 days then  increase to 60 mg daily- for nerve pain- START this 2-3 days AFTER Lyrica. After 3-4 weeks, on Cymbalta,  can increase to 120 mg daily, but you need to call to do so.    3. Zanaflex/Tizanidine-  2-4 mg2x/day for muscle spasms.  Take SCHEDULED. Can lower BP a small amount  4. Can also have 10 mg tab- Flexeril (had 5 mg tabs before) to take nightly AS NEEDED for spasms.    5. Can wean off Gabapentin- if tolerate din the next 2-3 weeks.   6. Can take the first day- Sleep- Trazodone- 50 mg nightly for sleep- can increase by 25 mg at a time to a max of 100 mg nightly for sleep- can also increase in future if needed to a max of 300 mg nightly in future if needed.   7. F/U - call me in 3-4 weeks to let me know how things are going.   8. Wait on going over Fibromyalgia issues for today-   9 F/U in 3 months.    I spent a total of  47  minutes on total care today- >50% coordination of care- due to discussion/education on med changes.  Doesn't like Tramadol.

## 2022-03-29 ENCOUNTER — Encounter (HOSPITAL_BASED_OUTPATIENT_CLINIC_OR_DEPARTMENT_OTHER): Payer: Self-pay | Admitting: Physical Therapy

## 2022-03-29 ENCOUNTER — Telehealth: Payer: Self-pay | Admitting: *Deleted

## 2022-03-29 ENCOUNTER — Ambulatory Visit (HOSPITAL_BASED_OUTPATIENT_CLINIC_OR_DEPARTMENT_OTHER)
Admission: RE | Admit: 2022-03-29 | Discharge: 2022-03-29 | Disposition: A | Discharge status: Home / Self Care | Payer: 59 | Source: Ambulatory Visit | Attending: Family | Admitting: Family

## 2022-03-29 ENCOUNTER — Ambulatory Visit (INDEPENDENT_AMBULATORY_CARE_PROVIDER_SITE_OTHER): Payer: 59

## 2022-03-29 ENCOUNTER — Ambulatory Visit (HOSPITAL_BASED_OUTPATIENT_CLINIC_OR_DEPARTMENT_OTHER): Payer: 59 | Attending: Podiatry | Admitting: Physical Therapy

## 2022-03-29 ENCOUNTER — Encounter (HOSPITAL_BASED_OUTPATIENT_CLINIC_OR_DEPARTMENT_OTHER): Payer: Self-pay

## 2022-03-29 DIAGNOSIS — M25571 Pain in right ankle and joints of right foot: Secondary | ICD-10-CM | POA: Insufficient documentation

## 2022-03-29 DIAGNOSIS — M6281 Muscle weakness (generalized): Secondary | ICD-10-CM | POA: Diagnosis present

## 2022-03-29 DIAGNOSIS — Z1231 Encounter for screening mammogram for malignant neoplasm of breast: Secondary | ICD-10-CM | POA: Diagnosis present

## 2022-03-29 DIAGNOSIS — J309 Allergic rhinitis, unspecified: Secondary | ICD-10-CM | POA: Diagnosis not present

## 2022-03-29 DIAGNOSIS — R262 Difficulty in walking, not elsewhere classified: Secondary | ICD-10-CM | POA: Diagnosis present

## 2022-03-29 IMAGING — MG DIGITAL SCREENING BREAST BILAT IMPLANT W/ TOMO W/ CAD
8 of 12 series · 8 of 28 positions shown · non-contrast
Comparison: Previous exam(s).

CLINICAL DATA: Screening.

EXAM:
DIGITAL SCREENING BILATERAL MAMMOGRAM WITH IMPLANTS, CAD AND
TOMOSYNTHESIS
TECHNIQUE: Bilateral screening digital craniocaudal and mediolateral oblique
mammograms were obtained. Bilateral screening digital breast
tomosynthesis was performed. The images were evaluated with
computer-aided detection. Standard and/or implant displaced views
were performed.

[R CC]
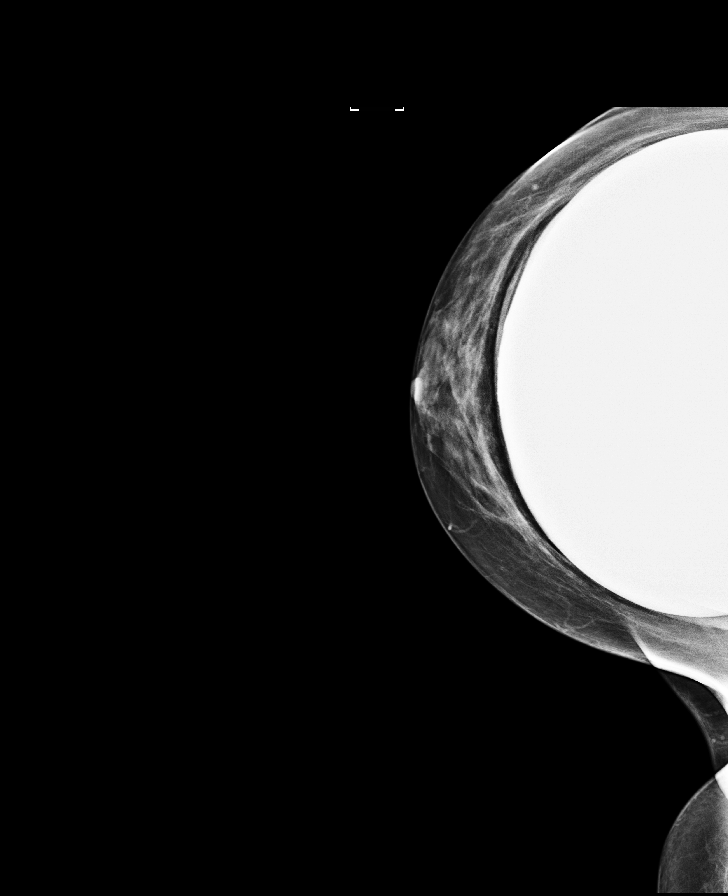

[L CC]
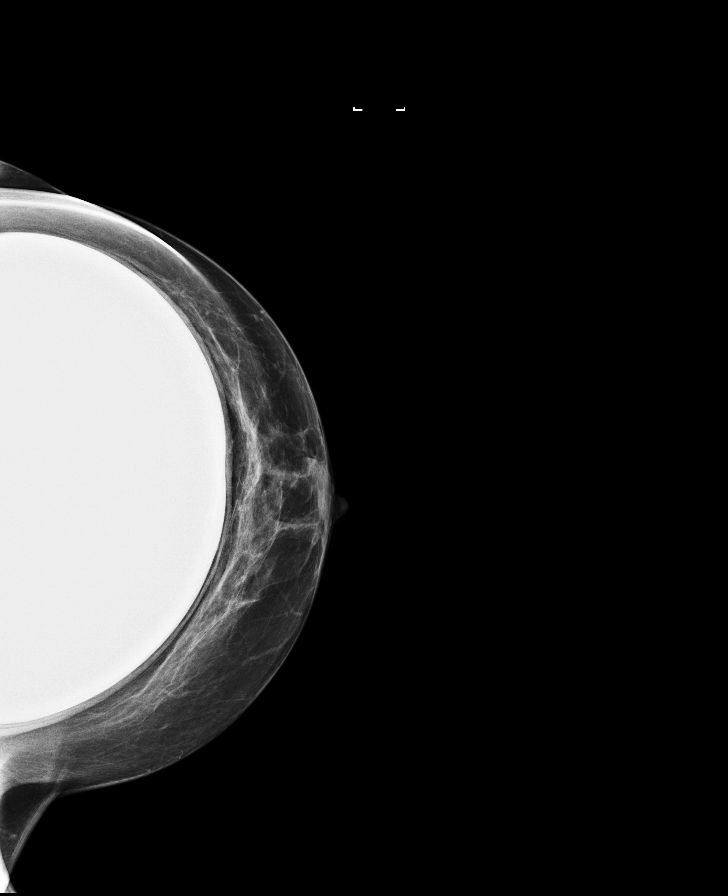

[R MLO]
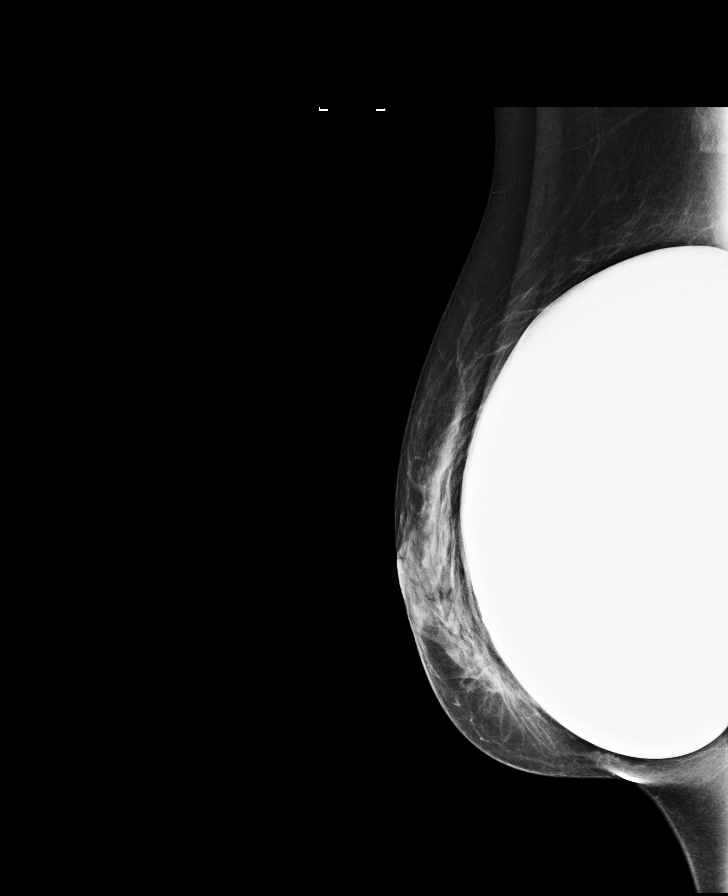

[L MLO]
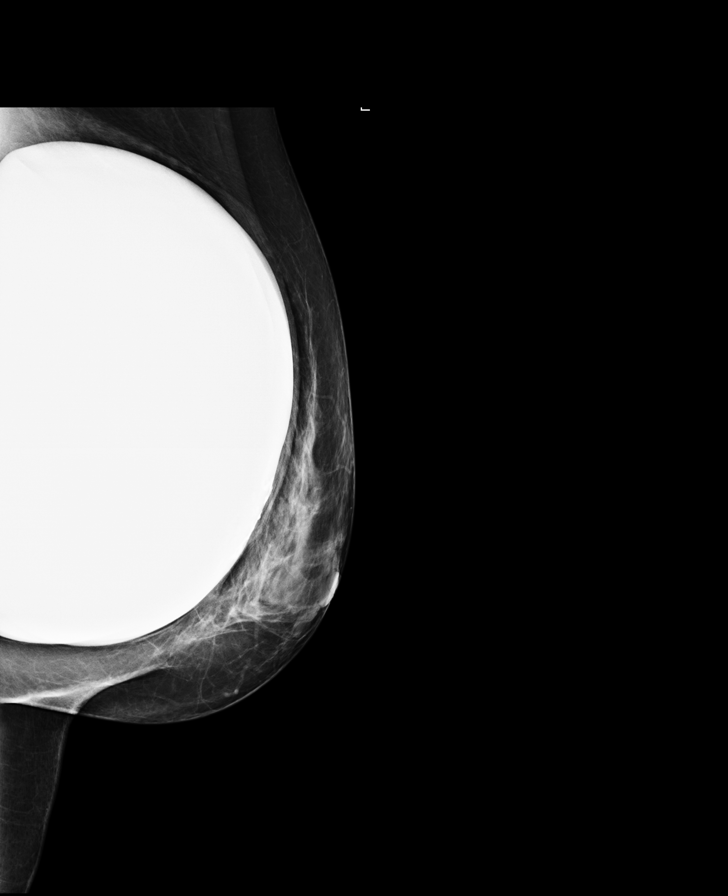

[L CC synth-2D]
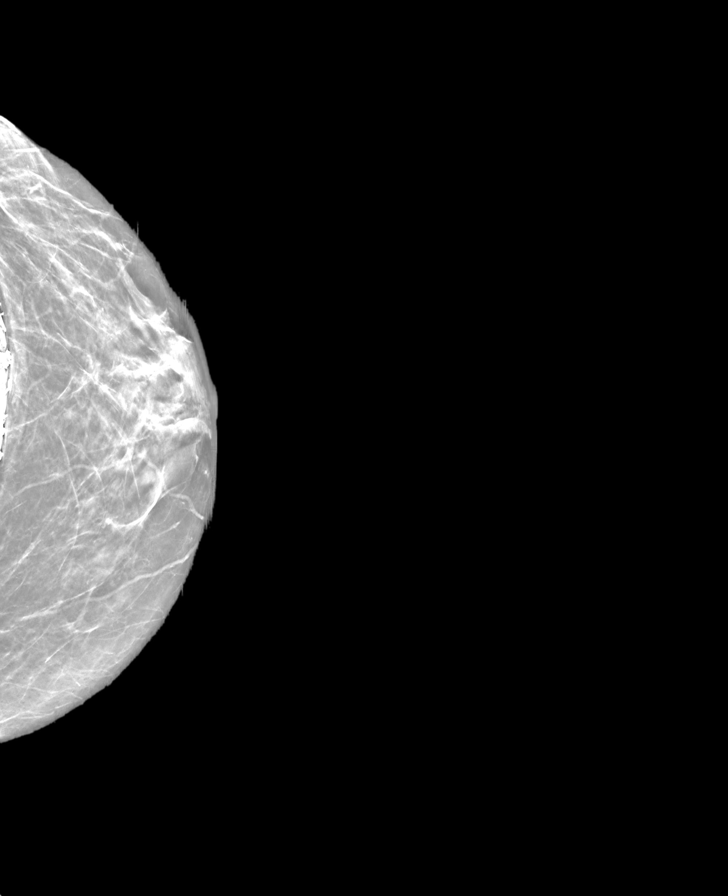

[R CC synth-2D]
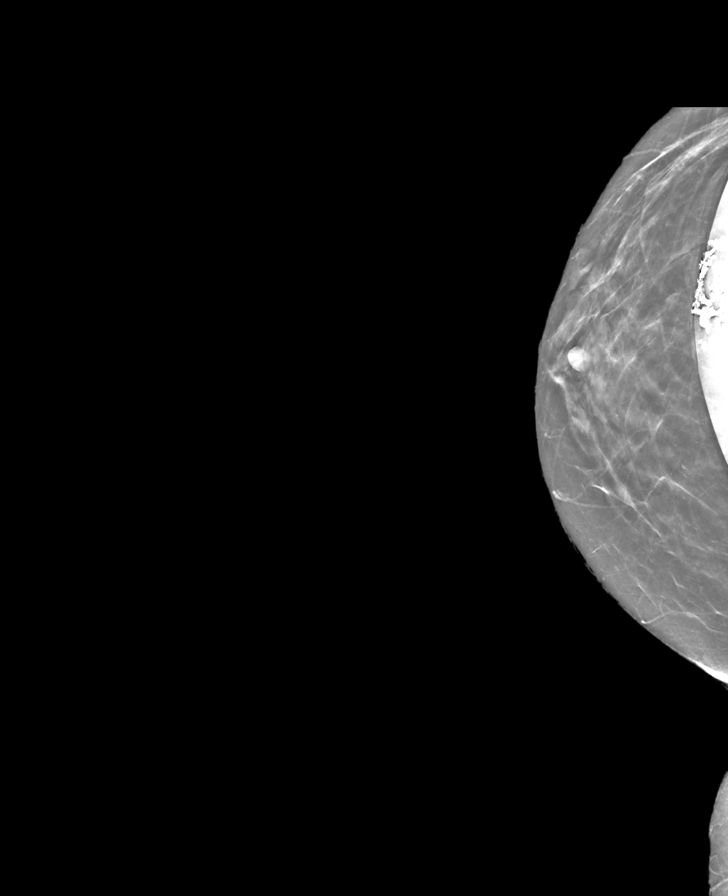

[L MLO synth-2D]
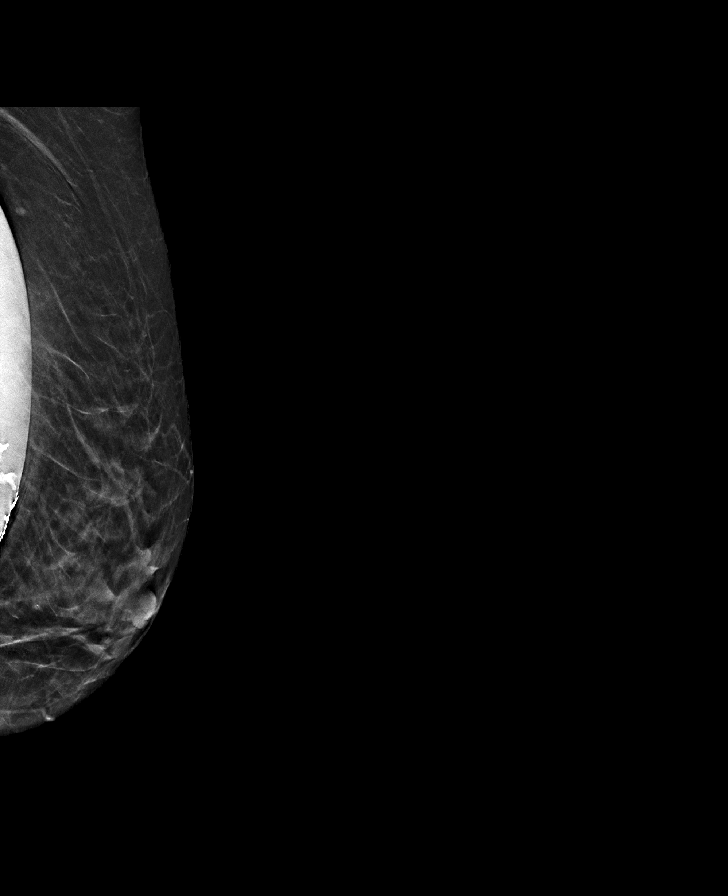

[R MLO synth-2D]
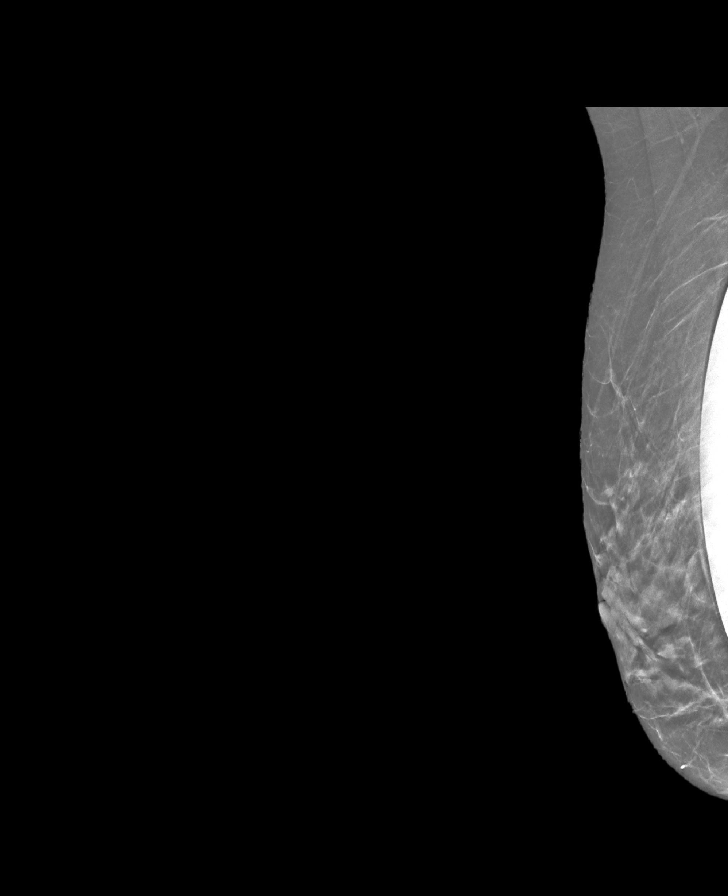

[8 of 28 positions shown; findings below may reference images not displayed]

ACR Breast Density Category b: There are scattered areas of
fibroglandular density.
FINDINGS: The patient has retropectoral implants. There are no findings
suspicious for malignancy.
IMPRESSION: No mammographic evidence of malignancy. A result letter of this
screening mammogram will be mailed directly to the patient.

RECOMMENDATION:
Screening mammogram in one year. (Code:[WS])

BI-RADS CATEGORY  1:  Negative.

## 2022-03-29 MED ORDER — EPINEPHRINE 0.3 MG/0.3ML IJ SOAJ: 0.3000 mg | INTRAMUSCULAR | 1 refills | Status: AC | PRN

## 2022-03-29 NOTE — Therapy (Signed)
OUTPATIENT PHYSICAL THERAPY TREATMENT NOTE   Patient Name: Laura Fields MRN: [id number] DOB:05/10/1961, 61 y.o., female Today's Date: 03/29/2022   PT End of Session - 03/29/22 0728     Visit Number 25    Number of Visits 37    Date for PT Re-Evaluation 05/04/22    PT Start Time 0715    PT Stop Time 0758    PT Time Calculation (min) 43 min    Activity Tolerance --    Behavior During Therapy [facility] for tasks assessed/performed             Past Medical History:  Diagnosis Date   Allergy    Anxiety    Arthritis    Asthma    Depression    Fibromyalgia    Intrinsic atopic dermatitis 12/01/2020   Osteoporosis    Raynaud's disease    Sciatica    Past Surgical History:  Procedure Laterality Date   ABDOMINAL HYSTERECTOMY     ACHILLES TENDON REPAIR Right 07/02/2021   AUGMENTATION MAMMAPLASTY Bilateral 1999   Saline/ under muscle   back nerve     block   COLONOSCOPY  09/23/2013   [facility]   colonoscopy with endo  11/30/2021   CYST REMOVAL NECK Left 12/23/2021   GASTRIC BYPASS     KNEE ARTHROSCOPY     mass removable     on thumb   STOMACH SURGERY     Patient Active Problem List   Diagnosis Date Noted   Nerve pain 03/25/2022   Chronic pain syndrome 03/25/2022   Complex regional pain syndrome type 1 of right lower extremity 03/25/2022   Severe persistent asthma without complication 38/18/2993   Seasonal allergic rhinitis due to pollen 12/29/2021   Hand cramps 11/26/2021   Left hand weakness 10/11/2021   Numbness and tingling in left hand 09/02/2021   Gastroesophageal reflux disease 07/28/2021   Other atopic dermatitis 12/01/2020   Pruritic rash 02/12/2020   Allergic conjunctivitis of both eyes 02/12/2020   REFERRING PROVIDER: Garrel Ridgel, DPM   REFERRING DIAG: M76.61 (ICD-10-CM) - Achilles tendinitis of right lower extremity             Z98.890 (ICD-10-CM) - Status post foot surgery  THERAPY DIAG:  Pain in right ankle and joints of right foot   Muscle  weakness (generalized)   Difficulty in walking, not elsewhere classified   ONSET DATE: Surgery 07/02/2021   SUBJECTIVE:    SUBJECTIVE STATEMENT: Pt returns after being away for 2 weeks; did a lot of walking.  She reports that her Lt knee feels better.  Per pt, her Physical Medicine  MD changed her medicines.  She states her surgeon told her she has CRPS (RLE) and has ordered MRI.   PERTINENT HISTORY: -gastroc recession Achilles tenolysis (tendon repair) spur resection (07/02/2021) -Rheumatoid syndrome per pt, Osteoporosis, Fibromyalgia, Raynaud's disease, DDD lumbar spine and sciatica, and L knee and LE surgery has rods in distal LE LE    PAIN:  Are you having pain? Yes, "a little" 5/10  Location: Post Rt ankle / calf   PRECAUTIONS: Other: achilles tendon repair and gastroc recession surgery, osteoporosis, raynaud's disease.  Pt ambulating with cane.   WEIGHT BEARING RESTRICTIONS WBAT   FALLS:  Has patient fallen in last 6 months? Yes, 3 falls.  Pt states the top of her foot gave out.  Last fall less than 1 month ago.     LIVING ENVIRONMENT: Lives with: lives alone Lives in: House/apartment Stairs: 1  story home with 3 steps and bilat rails  Has following equipment at home:  CAM walker boot and 3 pronged cane   OCCUPATION: Pt is on long term disability   PLOF: Independent Pt reports she used a boot for 2.5 years prior to surgery.  Pt ambulated without a cane.  Pt able to ambulate and perform functional mobility with less pain and increased ease.   PATIENT GOALS  Pt wants to be able to travel.  Improve ambulation and walk without cane     OBJECTIVE:  * Findings taken at [facility] unless otherwise noted.     PATIENT SURVEYS:  FOTO 25 with a goal of 29 at visit #17 03/09/22:  73    COGNITION:           Overall cognitive status: Within functional limits for tasks assessed                          OBSERVATION:             Pt has a small incision on L sided cervical which is closed.   Incisions in distal R LE are well healed     PALPATION: TTP at R achilles > R calf   LE ROM:   AROM/PROM Right 02/01/2022  Right 03/09/2022  Hip flexion       Hip extension       Hip abduction       Hip adduction       Hip internal rotation       Hip external rotation       Knee flexion       Knee extension       Ankle dorsiflexion 0/10  5/8   Ankle plantarflexion 56     Ankle inversion 39  37 AROM   Ankle eversion 10/14  16 AROM    (Blank rows = not tested)   LE STRENGTH:   MMT Right 02/01/2022 Right 03/09/22  Hip flexion      Hip extension      Hip abduction      Hip adduction      Hip internal rotation      Hip external rotation      Knee flexion      Knee extension      Ankle dorsiflexion Tol min reistance 4-/5 with pain   Ankle plantarflexion Tol min resistance with pain  not tested  Ankle inversion 4/5  4-/5  Ankle eversion 4-/5 4/5    (Blank rows = not tested)       GAIT: Assistive device utilized:  3 pronged cane Comments: Pt favors R LE with increased stance time on L LE.  Pt has decreased Wb'ing thru R LE. Pt has decreased DF and foot clearance with gait.  Pt feels "pulling" in ankle with gait.         TODAY'S TREATMENT: Pt seen for aquatic therapy today.  Treatment took place in water 3.25-4 ft in depth at the [facility] pool. Temp of water was 91.  Pt entered/exited the pool via stairs independently with bilat rail.  -Multiple laps (with UE on yellow noodle) of forward and backward walking, and side stepping.  Focus on rolling through foot/ toes, even stance time and weight shift. -Braiding -Box walking (4 steps each direction)  -Holding wall: forward R kicks with LAQ -Holding onto wall: squats x 10 with hip abdct/add;  Heel / toe raises x 10: -seated on  yellow noodle and holding wall: squats (to acclimate to balance on noodle);  cycling (varied speeds) and hip abdct/add -seated on 4th step:  5 STS without UE support - bilat calf  stretch with heels off of step (limited tolerance) -holding wall:  Rt soleus stretch x 15s   Manual:  I strip of standard KT tape applied over incision in Rt calf down to calcaneus with 25% stretch; perpendicular strips applied over incision in calf and incision on calcaneus with 50% stretch for extra decompression of tissue.     Pt requires buoyancy for support and to offload joints with strengthening exercises. Viscosity of the water is needed for resistance of strengthening; water current perturbations provides challenge to standing balance unsupported, requiring increased core activation.  PATIENT EDUCATION:  Education details:  Person educated: Patient Education method: Explanation Education comprehension: verbalized understanding     HOME EXERCISE PROGRAM: [email or web address]   ASSESSMENT:   CLINICAL IMPRESSION: Pt able to complete additional exercises today without increase in pain.  Continued limited tolerance for standing calf stretch on RLE.  No reported increase in spasming or pain in RLE (except for gastroc stretch) during session.  Pt should benefit from skilled PT services including aquatic therapy to address goals, improve ROM and strength, and to assist in improving overall function.  progressing very gradually towards remaining goals.      OBJECTIVE IMPAIRMENTS Abnormal gait, decreased activity tolerance, decreased endurance, decreased mobility, difficulty walking, decreased ROM, decreased strength, hypomobility, impaired flexibility, and pain.    ACTIVITY LIMITATIONS cleaning, community activity, meal prep, and ambulation .    PERSONAL FACTORS 3+ comorbidities: Rheumatoid syndrome per pt, Osteoporosis, Fibromyalgia, Raynaud's disease, DDD lumbar spine and sciatica,  are also affecting patient's functional outcome.      REHAB POTENTIAL: Good   CLINICAL DECISION MAKING: Evolving/moderate complexity   EVALUATION COMPLEXITY: Moderate     GOALS:     SHORT TERM GOALS: Target  date: 03/01/2022   Pt will be independent with HEP.  Baseline: Goal status: MET Target date: 01/14/2022 2.  Pt will improve FOTO to > = 52 to demo improved functional mobility Baseline: 47 -03/09/22 Goal status: In progress  Target date: 03/01/2022 3.  Pt will tolerate gait x 50 ft without AD with pain < = 3/10 to improve household mobility.   Baseline:  Goal status: NOT MET Target date:  03/01/2022   4.  Pt will ambulate with improved Wb'ing and increased stance time on R LE and no > than a minimal limp   Baseline:  Goal status: Partially met  Target date:  03/01/2022       LONG TERM GOALS: Target date: 05/04/2022    Pt will improve R ankle strength to 4/5 to tolerate gait and mobility Baseline:  Goal status: IN PROGRESS   2.  Pt will be able to ambulate community distance without significant pain or difficulty.  Baseline:  Goal status: NOT MET   3.  Pt will be able to ascend and descend stairs with a reciprocal gait with rail without significant pain.  Baseline:  Goal status: Partially met    4.  Pt will ambulate with = Wb'ing in bilat LE's and without limping for improved community ambulation.  Baseline:  Goal status: In PROGRESS          PLAN: PT FREQUENCY: 2x/week   PT DURATION: 8 weeks   PLANNED INTERVENTIONS: Therapeutic exercises, Therapeutic activity, Neuromuscular re-education, Balance training, Gait training, Patient/Family education, Stair training, DME instructions, Aquatic Therapy,  Dry Needling, Electrical stimulation, Cryotherapy, Moist heat, Taping, and Manual therapy   PLAN FOR NEXT SESSION: Cont with aquatic therapy and then a land visit.  Cont with gait training, ankle ROM, and ankle strengthening.   Kerin Perna, PTA 03/29/22 9:46 AM

## 2022-03-29 NOTE — Telephone Encounter (Signed)
L/m for patient to contact me to advise approval through her medical plan [employer] and submit to [facility] along with copay card info to be emailed to patient

## 2022-03-29 NOTE — Telephone Encounter (Signed)
Patient advised of approval, copay and submit to [facility]. Patient advised she has not gotten her epipen due to some issue and I advised will send to clinical team in [place] to address

## 2022-03-29 NOTE — Addendum Note (Signed)
Addended by: Berna Bue on: 03/29/2022 11:34 AM   Modules accepted: Orders

## 2022-03-29 NOTE — Telephone Encounter (Signed)
Sent in epipen to last pharmacy on file

## 2022-03-30 ENCOUNTER — Telehealth: Payer: Self-pay

## 2022-03-30 ENCOUNTER — Other Ambulatory Visit: Payer: Self-pay

## 2022-03-30 ENCOUNTER — Ambulatory Visit (HOSPITAL_BASED_OUTPATIENT_CLINIC_OR_DEPARTMENT_OTHER): Payer: 59 | Admitting: Physical Therapy

## 2022-03-30 MED ORDER — EPINEPHRINE 0.3 MG/0.3ML IJ SOAJ: INTRAMUSCULAR | 1 refills | Status: AC

## 2022-03-30 NOTE — Telephone Encounter (Signed)
Name brand of epi pen 0.3mg  is not covered by patient's formulary. I called the patient's prescription plan and I spoke to syed at [facility] and he said the generic brand of epipen 0.3mg  is covered by Pt.'s plan. Ndc number for pharmacy to use is [phone]. Resent the epipen0.3mg   generic [facility] to [facility] will call patient to let her know if she is having any problems receiving her epipen 0.3mg  to please call us.

## 2022-03-30 NOTE — Telephone Encounter (Signed)
PA has been submitted for epi pen 0.3mg  waiting on a response to see if it has been approved or denied. Patient is aware

## 2022-03-31 ENCOUNTER — Telehealth: Payer: Self-pay | Admitting: *Deleted

## 2022-03-31 NOTE — Telephone Encounter (Signed)
[employer] prior authorization approval [id number], effective date: 03/30/22 thru 06/30/22. Approved 03/30/22 Called /notified Melissa at [facility] precert,

## 2022-04-02 ENCOUNTER — Ambulatory Visit (HOSPITAL_BASED_OUTPATIENT_CLINIC_OR_DEPARTMENT_OTHER)
Admission: RE | Admit: 2022-04-02 | Discharge: 2022-04-02 | Disposition: A | Discharge status: Home / Self Care | Payer: 59 | Source: Ambulatory Visit | Attending: Podiatry | Admitting: Podiatry

## 2022-04-02 DIAGNOSIS — S86011S Strain of right Achilles tendon, sequela: Secondary | ICD-10-CM | POA: Insufficient documentation

## 2022-04-02 IMAGING — MR MR ANKLE*R* W/O CM
4 of 6 series · 18 of 40 positions shown · non-contrast
Comparison: None Available.

CLINICAL DATA: Ankle pain for 3.5 years

EXAM:
MRI OF THE RIGHT ANKLE WITHOUT CONTRAST
TECHNIQUE: Multiplanar, multisequence MR imaging of the ankle was performed. No
intravenous contrast was administered.

[Series 3: T2 fat-sat · axial · 3.0mm · 0.50mm/px · z∈[-106,+25]mm · 4 of 40 slices shown (1 of 2)]
[im 1/40]
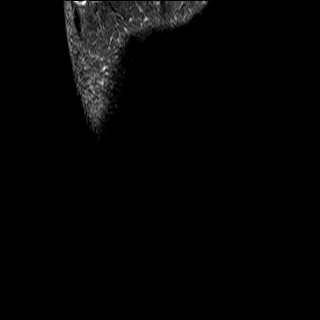
[im 6/40]
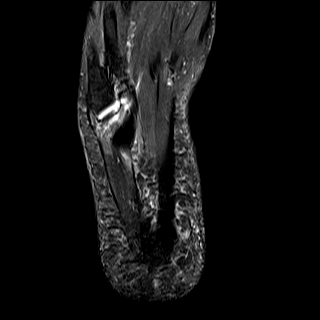
[im 23/40]
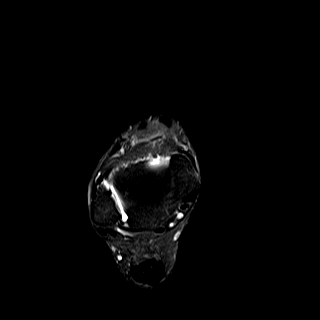
[im 34/40]
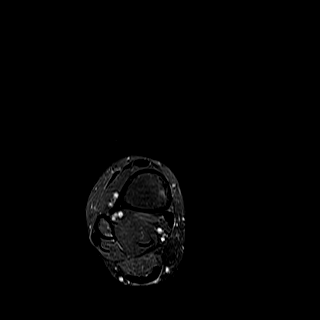

[Series 4: PD fat-sat · axial · 3.0mm · 0.31mm/px · z∈[-106,+49]mm · 8 of 40 slices shown]
[im 1/40]
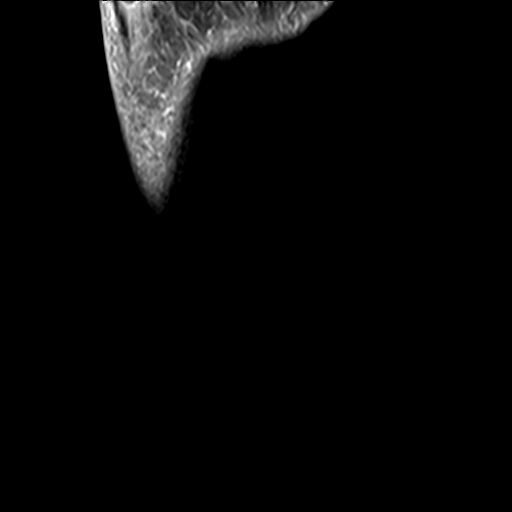
[im 6/40]
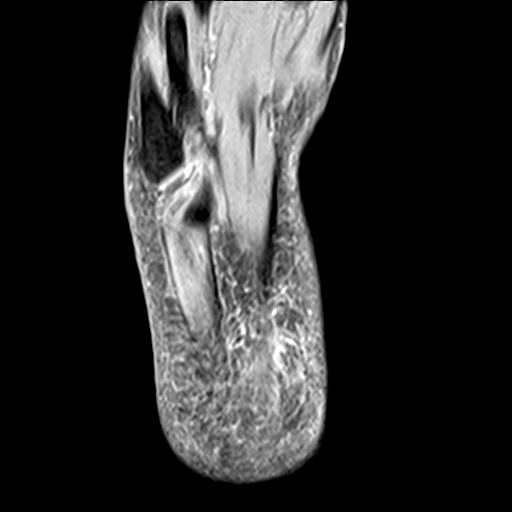
[im 12/40]
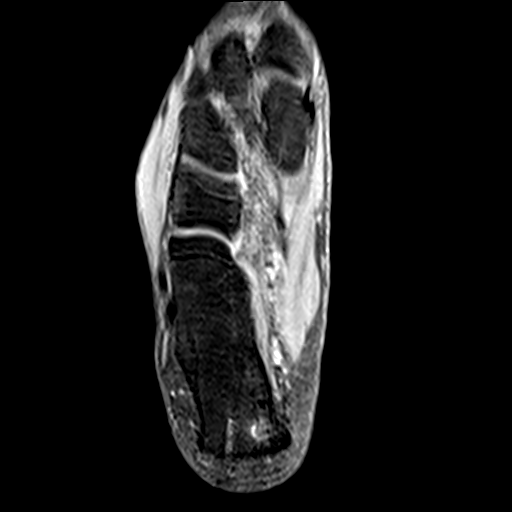
[im 17/40]
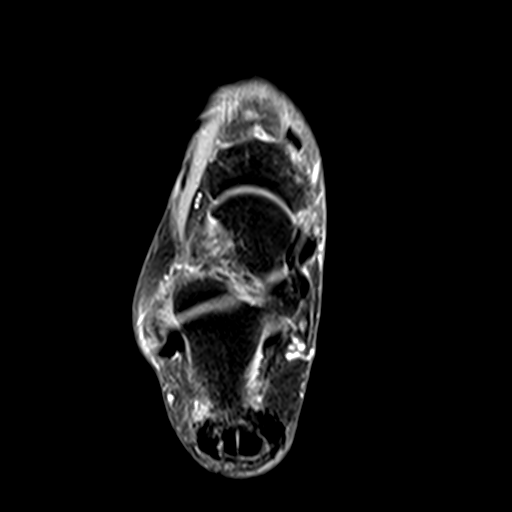
[im 23/40]
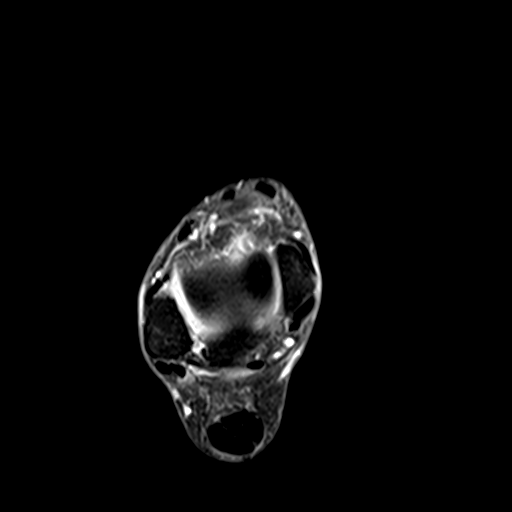
[im 28/40]
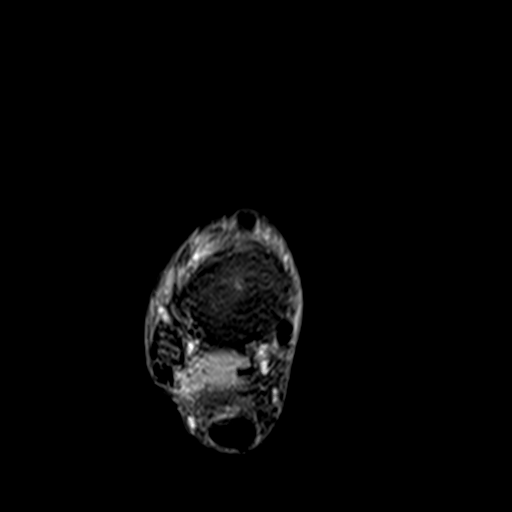
[im 34/40]
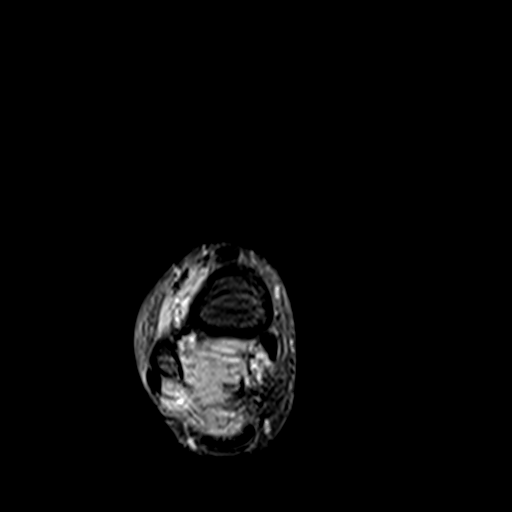
[im 40/40]
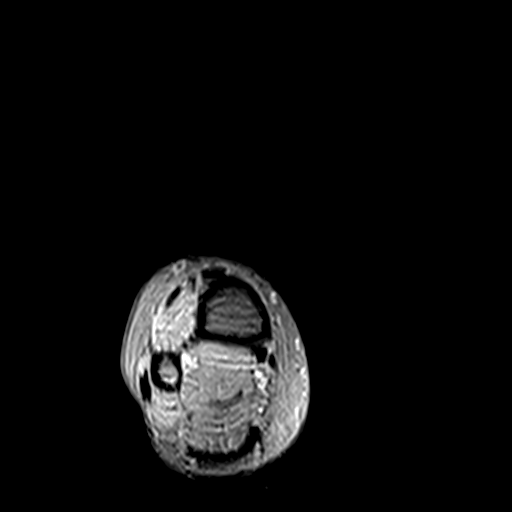

[Series 5: T2 fat-sat · coronal · 3.0mm · 0.29mm/px · 3 of 36 slices shown (2 of 2)]
[im 6/36]
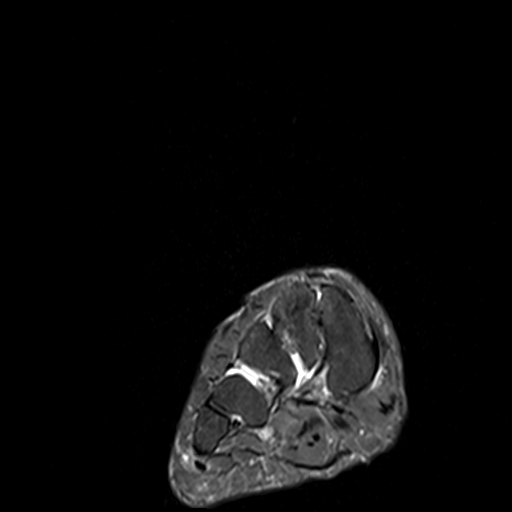
[im 21/36]
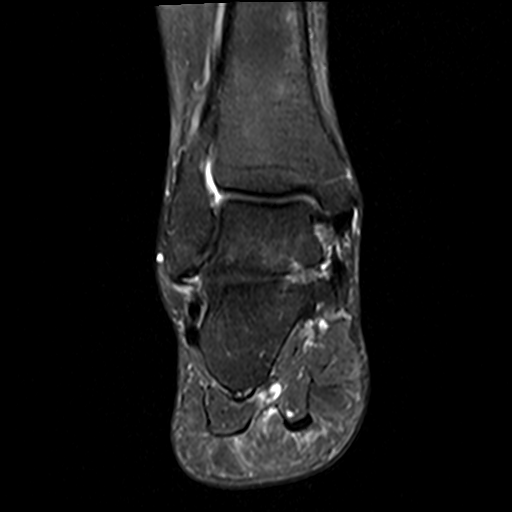
[im 31/36]
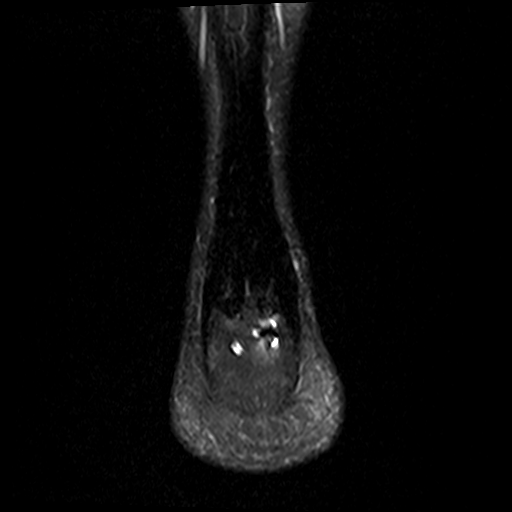

[Series 8: T1 · sagittal · 4.0mm · 0.21mm/px · 3 of 18 slices shown]
[im 1/18]
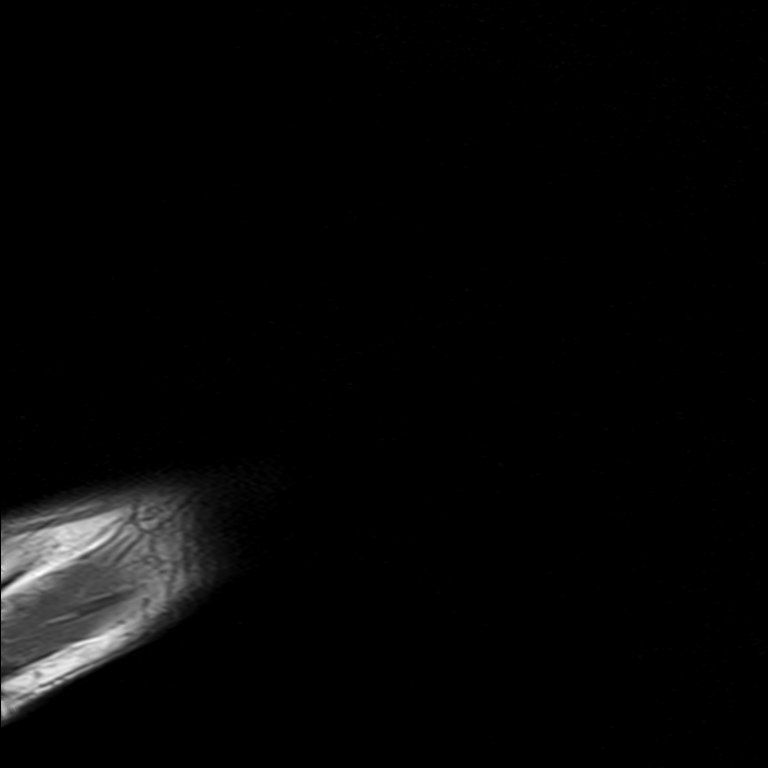
[im 12/18]
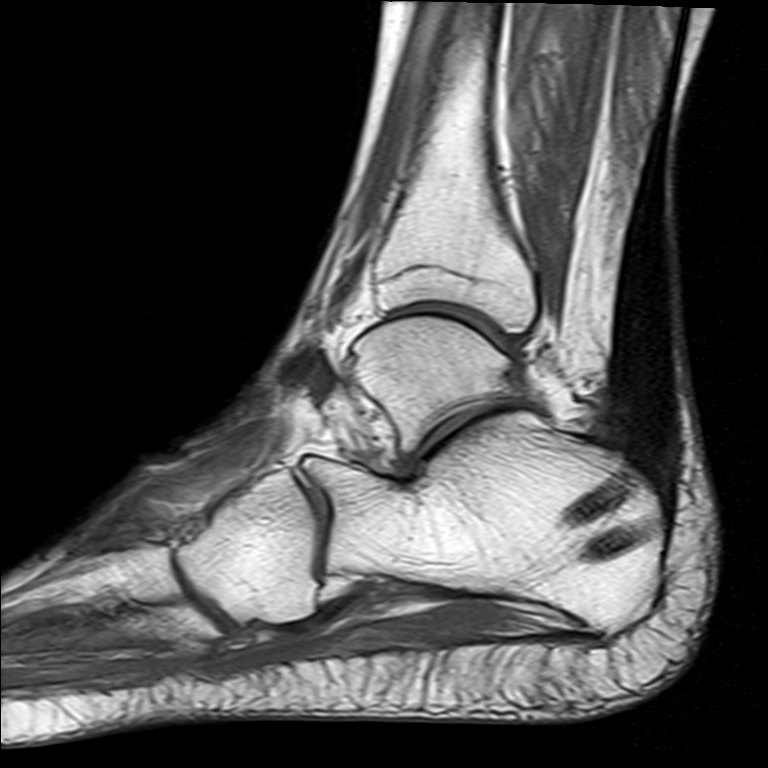
[im 18/18]
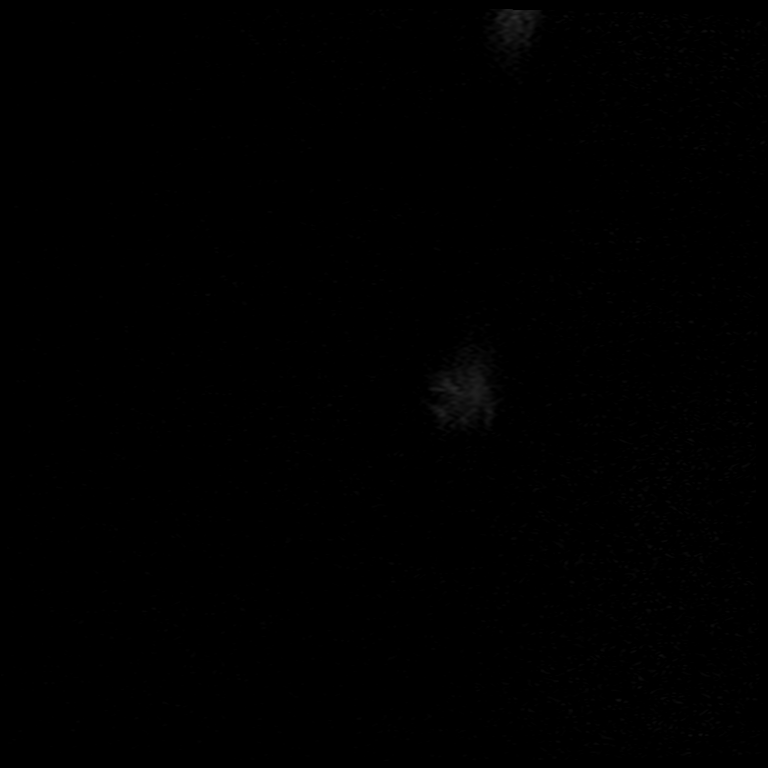

[18 of 40 positions shown; findings below may reference images not displayed]

FINDINGS: TENDONS

Peroneal: Peroneal longus tendon intact. Peroneal brevis intact.

Posteromedial: Posterior tibial tendon intact. Flexor hallucis
longus tendon intact. Flexor digitorum longus tendon intact.

Anterior: Tibialis anterior tendon intact. Extensor hallucis longus
tendon intact Extensor digitorum longus tendon intact.

Achilles: Severe low signal thickening of the distal Achilles tendon
consistent with postsurgical changes. Achilles tendon is intact.

Plantar Fascia: Mild thickening and increased signal of the medial
band of the plantar fascia (image 15/series 5) as can be seen with
mild plantar fasciitis.

LIGAMENTS

Lateral: Anterior talofibular ligament intact. Calcaneofibular
ligament intact. Posterior talofibular ligament intact. Anterior and
posterior tibiofibular ligaments intact.

Medial: Deltoid ligament intact. Spring ligament intact.

CARTILAGE

Ankle Joint: No joint effusion. Normal ankle mortise. No chondral
defect.

Subtalar Joints/Sinus Tarsi: Normal subtalar joints. No subtalar
joint effusion. Normal sinus tarsi.

Bones: No aggressive osseous lesion. No fracture or dislocation.

Soft Tissue: No fluid collection or hematoma. Muscles are normal
without edema or atrophy. Tarsal tunnel is normal.
IMPRESSION: 1. Postsurgical changes of the Achilles tendon which is otherwise
intact. No recurrent Achilles tear.
2. Mild plantar fasciitis of the medial band of the plantar fascia.

## 2022-04-05 ENCOUNTER — Telehealth: Payer: Self-pay | Admitting: *Deleted

## 2022-04-05 ENCOUNTER — Ambulatory Visit (HOSPITAL_BASED_OUTPATIENT_CLINIC_OR_DEPARTMENT_OTHER): Payer: 59 | Admitting: Physical Therapy

## 2022-04-05 ENCOUNTER — Encounter (HOSPITAL_BASED_OUTPATIENT_CLINIC_OR_DEPARTMENT_OTHER): Payer: Self-pay | Admitting: Physical Therapy

## 2022-04-05 DIAGNOSIS — M25571 Pain in right ankle and joints of right foot: Secondary | ICD-10-CM

## 2022-04-05 DIAGNOSIS — R262 Difficulty in walking, not elsewhere classified: Secondary | ICD-10-CM

## 2022-04-05 DIAGNOSIS — M6281 Muscle weakness (generalized): Secondary | ICD-10-CM

## 2022-04-05 MED ORDER — GABAPENTIN 100 MG PO CAPS: 100.0000 mg | ORAL_CAPSULE | Freq: Three times a day (TID) | ORAL | 3 refills | Status: DC

## 2022-04-05 NOTE — Therapy (Signed)
OUTPATIENT PHYSICAL THERAPY TREATMENT NOTE   Patient Name: Laura Fields MRN: [id number] DOB:05-14-1961, 61 y.o., female Today's Date: 04/05/2022   PT End of Session - 04/05/22 0714     Visit Number 26    Number of Visits 37    Date for PT Re-Evaluation 05/04/22    PT Start Time 0712    PT Stop Time 0756    PT Time Calculation (min) 44 min    Behavior During Therapy [facility] for tasks assessed/performed             Past Medical History:  Diagnosis Date   Allergy    Anxiety    Arthritis    Asthma    Depression    Fibromyalgia    Intrinsic atopic dermatitis 12/01/2020   Osteoporosis    Raynaud's disease    Sciatica    Past Surgical History:  Procedure Laterality Date   ABDOMINAL HYSTERECTOMY     ACHILLES TENDON REPAIR Right 07/02/2021   AUGMENTATION MAMMAPLASTY Bilateral 1999   Saline/ under muscle   back nerve     block   COLONOSCOPY  09/23/2013   [facility] Endoscopy   colonoscopy with endo  11/30/2021   CYST REMOVAL NECK Left 12/23/2021   GASTRIC BYPASS     KNEE ARTHROSCOPY     mass removable     on thumb   STOMACH SURGERY     Patient Active Problem List   Diagnosis Date Noted   Nerve pain 03/25/2022   Chronic pain syndrome 03/25/2022   Complex regional pain syndrome type 1 of right lower extremity 03/25/2022   Severe persistent asthma without complication 33/29/5188   Seasonal allergic rhinitis due to pollen 12/29/2021   Hand cramps 11/26/2021   Left hand weakness 10/11/2021   Numbness and tingling in left hand 09/02/2021   Gastroesophageal reflux disease 07/28/2021   Other atopic dermatitis 12/01/2020   Pruritic rash 02/12/2020   Allergic conjunctivitis of both eyes 02/12/2020   REFERRING PROVIDER: Garrel Ridgel, DPM   REFERRING DIAG: M76.61 (ICD-10-CM) - Achilles tendinitis of right lower extremity             Z98.890 (ICD-10-CM) - Status post foot surgery  THERAPY DIAG:  Pain in right ankle and joints of right foot   Muscle weakness (generalized)    Difficulty in walking, not elsewhere classified   ONSET DATE: Surgery 07/02/2021   SUBJECTIVE:    SUBJECTIVE STATEMENT: Pt reports she continues to do ankle/LE exercises.  Her pain has been better controlled since her medication was changed.    PERTINENT HISTORY: -gastroc recession Achilles tenolysis (tendon repair) spur resection (07/02/2021) -Rheumatoid syndrome per pt, Osteoporosis, Fibromyalgia, Raynaud's disease, DDD lumbar spine and sciatica, and L knee and LE surgery has rods in distal LE LE    PAIN:  Are you having pain? Yes 4/10  Location: Post Rt ankle / calf   PRECAUTIONS: Other: achilles tendon repair and gastroc recession surgery, osteoporosis, raynaud's disease.  Pt ambulating with cane.   WEIGHT BEARING RESTRICTIONS WBAT   FALLS:  Has patient fallen in last 6 months? Yes, 3 falls.  Pt states the top of her foot gave out.  Last fall less than 1 month ago.     LIVING ENVIRONMENT: Lives with: lives alone Lives in: House/apartment Stairs: 1 story home with 3 steps and bilat rails  Has following equipment at home:  CAM walker boot and 3 pronged cane   OCCUPATION: Pt is on long term disability   PLOF: Independent  Pt reports she used a boot for 2.5 years prior to surgery.  Pt ambulated without a cane.  Pt able to ambulate and perform functional mobility with less pain and increased ease.   PATIENT GOALS  Pt wants to be able to travel.  Improve ambulation and walk without cane     OBJECTIVE:  * Findings taken at [facility] unless otherwise noted.     PATIENT SURVEYS:  FOTO 25 with a goal of 55 at visit #17 03/09/22:  31    COGNITION:           Overall cognitive status: Within functional limits for tasks assessed                          OBSERVATION:             Pt has a small incision on L sided cervical which is closed.  Incisions in distal R LE are well healed     PALPATION: TTP at R achilles > R calf   LE ROM:   AROM/PROM Right 02/01/2022  Right 03/09/2022   Hip flexion       Hip extension       Hip abduction       Hip adduction       Hip internal rotation       Hip external rotation       Knee flexion       Knee extension       Ankle dorsiflexion 0/10  5/8   Ankle plantarflexion 56     Ankle inversion 39  37 AROM   Ankle eversion 10/14  16 AROM    (Blank rows = not tested)   LE STRENGTH:   MMT Right 02/01/2022 Right 03/09/22  Hip flexion      Hip extension      Hip abduction      Hip adduction      Hip internal rotation      Hip external rotation      Knee flexion      Knee extension      Ankle dorsiflexion Tol min reistance 4-/5 with pain   Ankle plantarflexion Tol min resistance with pain  not tested  Ankle inversion 4/5  4-/5  Ankle eversion 4-/5 4/5    (Blank rows = not tested)       GAIT: Assistive device utilized:  3 pronged cane Comments: Pt favors R LE with increased stance time on L LE.  Pt has decreased Wb'ing thru R LE. Pt has decreased DF and foot clearance with gait.  Pt feels "pulling" in ankle with gait.         TODAY'S TREATMENT: Pt seen for aquatic therapy today.  Treatment took place in water 3.25-4 ft in depth at the [facility] pool. Temp of water was 91.  Pt entered/exited the pool via stairs independently with bilat rail.  -Multiple laps (with UE on yellow noodle) of forward and backward walking, and side stepping.  Focus on rolling through foot/ toes, even stance time and weight shift. -Braiding -seated on yellow noodle and holding wall:  cycling (varied speeds), hip abdct/add, cc ski - SLS with horiz head motions (R/L) - runners R calf stretch; soleus stretch R -seated on 4th step:  10 STS without UE support - in 49ft water: heel / toe walking L/R with UE on wall -holding yellow noodle: walking forward kicks;  squat to/from hip abdct/addct - holding wall:  split squats x 10 each side with mod cues for technique  Self care:  pt instructed in IASTM to Rt calf and Achilles area with  tool and how to apply ktape to post calf / Achilles.  Pt verbalized understanding with demo given.    Pt requires buoyancy for support and to offload joints with strengthening exercises. Viscosity of the water is needed for resistance of strengthening; water current perturbations provides challenge to standing balance unsupported, requiring increased core activation.  PATIENT EDUCATION:  Education details:  Person educated: Patient Education method: Explanation Education comprehension: verbalized understanding     HOME EXERCISE PROGRAM: [email or web address]   ASSESSMENT:   CLINICAL IMPRESSION: Pt demonstrating improved exercise tolerance compared to previous sessions.  Pt reports elimination of R ankle pain during exercise in water; reports pain 2 hrs after of completion of session (6/10).  Discussed self care including IASTM/ tape application and demo provided.  Pt encouraged to buy roll of tape to continue getting benefits of taping outside of therapy sessions.  Pt should benefit from skilled PT services including aquatic therapy to address goals, improve ROM and strength, and to assist in improving overall function.  progressing very gradually towards remaining goals.      OBJECTIVE IMPAIRMENTS Abnormal gait, decreased activity tolerance, decreased endurance, decreased mobility, difficulty walking, decreased ROM, decreased strength, hypomobility, impaired flexibility, and pain.    ACTIVITY LIMITATIONS cleaning, community activity, meal prep, and ambulation .    PERSONAL FACTORS 3+ comorbidities: Rheumatoid syndrome per pt, Osteoporosis, Fibromyalgia, Raynaud's disease, DDD lumbar spine and sciatica,  are also affecting patient's functional outcome.      REHAB POTENTIAL: Good   CLINICAL DECISION MAKING: Evolving/moderate complexity   EVALUATION COMPLEXITY: Moderate     GOALS:     SHORT TERM GOALS: Target date: 03/01/2022   Pt will be independent with HEP.  Baseline: Goal status:  MET Target date: 01/14/2022 2.  Pt will improve FOTO to > = 52 to demo improved functional mobility Baseline: 47 -03/09/22 Goal status: In progress  Target date: 03/01/2022 3.  Pt will tolerate gait x 50 ft without AD with pain < = 3/10 to improve household mobility.   Baseline:  Goal status: NOT MET Target date:  03/01/2022   4.  Pt will ambulate with improved Wb'ing and increased stance time on R LE and no > than a minimal limp   Baseline:  Goal status: Partially met  Target date:  03/01/2022       LONG TERM GOALS: Target date: 05/04/2022    Pt will improve R ankle strength to 4/5 to tolerate gait and mobility Baseline:  Goal status: IN PROGRESS   2.  Pt will be able to ambulate community distance without significant pain or difficulty.  Baseline:  Goal status: IN PROGRESS   3.  Pt will be able to ascend and descend stairs with a reciprocal gait with rail without significant pain.  Baseline:  Goal status: Partially met    4.  Pt will ambulate with = Wb'ing in bilat LE's and without limping for improved community ambulation.  Baseline:  Goal status: In PROGRESS          PLAN: PT FREQUENCY: 2x/week   PT DURATION: 8 weeks   PLANNED INTERVENTIONS: Therapeutic exercises, Therapeutic activity, Neuromuscular re-education, Balance training, Gait training, Patient/Family education, Stair training, DME instructions, Aquatic Therapy, Dry Needling, Electrical stimulation, Cryotherapy, Moist heat, Taping, and Manual therapy   PLAN FOR NEXT SESSION: Cont with aquatic therapy and then  a land visit.  Cont with gait training, ankle ROM, and ankle strengthening.   Kerin Perna, PTA 04/05/22 10:00 AM

## 2022-04-05 NOTE — Telephone Encounter (Signed)
Patient is also calling for a refill of her pain medicine sent to pharmacy on file.

## 2022-04-05 NOTE — Telephone Encounter (Signed)
Patient is calling to request a sooner appointment(scheduled for 07/20) for MRI results, thinks doctor may want to see her before that date.

## 2022-04-06 ENCOUNTER — Ambulatory Visit (HOSPITAL_BASED_OUTPATIENT_CLINIC_OR_DEPARTMENT_OTHER): Payer: 59 | Admitting: Physical Therapy

## 2022-04-06 MED ORDER — OXYCODONE-ACETAMINOPHEN 10-325 MG PO TABS: 1.0000 | ORAL_TABLET | ORAL | 0 refills | Status: DC | PRN

## 2022-04-06 NOTE — Telephone Encounter (Signed)
Patient requesting pain medicine refill-oxycodone-ace,10/325 mg.

## 2022-04-07 ENCOUNTER — Ambulatory Visit (INDEPENDENT_AMBULATORY_CARE_PROVIDER_SITE_OTHER): Payer: 59

## 2022-04-07 DIAGNOSIS — J309 Allergic rhinitis, unspecified: Secondary | ICD-10-CM | POA: Diagnosis not present

## 2022-04-11 ENCOUNTER — Telehealth: Payer: Self-pay | Admitting: *Deleted

## 2022-04-11 NOTE — Telephone Encounter (Signed)
Error message

## 2022-04-12 ENCOUNTER — Ambulatory Visit (HOSPITAL_BASED_OUTPATIENT_CLINIC_OR_DEPARTMENT_OTHER): Payer: 59 | Admitting: Physical Therapy

## 2022-04-12 ENCOUNTER — Ambulatory Visit (INDEPENDENT_AMBULATORY_CARE_PROVIDER_SITE_OTHER): Payer: 59

## 2022-04-12 ENCOUNTER — Encounter (HOSPITAL_BASED_OUTPATIENT_CLINIC_OR_DEPARTMENT_OTHER): Payer: Self-pay | Admitting: Physical Therapy

## 2022-04-12 DIAGNOSIS — J309 Allergic rhinitis, unspecified: Secondary | ICD-10-CM

## 2022-04-12 DIAGNOSIS — M6281 Muscle weakness (generalized): Secondary | ICD-10-CM

## 2022-04-12 DIAGNOSIS — M25571 Pain in right ankle and joints of right foot: Secondary | ICD-10-CM

## 2022-04-12 DIAGNOSIS — R262 Difficulty in walking, not elsewhere classified: Secondary | ICD-10-CM

## 2022-04-12 NOTE — Therapy (Signed)
OUTPATIENT PHYSICAL THERAPY TREATMENT NOTE   Patient Name: Laura Fields MRN: [id number] DOB:12/26/1960, 61 y.o., female Today's Date: 04/12/2022   PT End of Session - 04/12/22 0730     Visit Number 27    Number of Visits 37    Date for PT Re-Evaluation 05/04/22    PT Start Time 0713    PT Stop Time 0758    PT Time Calculation (min) 45 min             Past Medical History:  Diagnosis Date   Allergy    Anxiety    Arthritis    Asthma    Depression    Fibromyalgia    Intrinsic atopic dermatitis 12/01/2020   Osteoporosis    Raynaud's disease    Sciatica    Past Surgical History:  Procedure Laterality Date   ABDOMINAL HYSTERECTOMY     ACHILLES TENDON REPAIR Right 07/02/2021   AUGMENTATION MAMMAPLASTY Bilateral 1999   Saline/ under muscle   back nerve     block   COLONOSCOPY  09/23/2013   [facility] Endoscopy   colonoscopy with endo  11/30/2021   CYST REMOVAL NECK Left 12/23/2021   GASTRIC BYPASS     KNEE ARTHROSCOPY     mass removable     on thumb   STOMACH SURGERY     Patient Active Problem List   Diagnosis Date Noted   Nerve pain 03/25/2022   Chronic pain syndrome 03/25/2022   Complex regional pain syndrome type 1 of right lower extremity 03/25/2022   Severe persistent asthma without complication 37/90/2409   Seasonal allergic rhinitis due to pollen 12/29/2021   Hand cramps 11/26/2021   Left hand weakness 10/11/2021   Numbness and tingling in left hand 09/02/2021   Gastroesophageal reflux disease 07/28/2021   Other atopic dermatitis 12/01/2020   Pruritic rash 02/12/2020   Allergic conjunctivitis of both eyes 02/12/2020   REFERRING PROVIDER: Garrel Ridgel, DPM   REFERRING DIAG: M76.61 (ICD-10-CM) - Achilles tendinitis of right lower extremity             Z98.890 (ICD-10-CM) - Status post foot surgery  THERAPY DIAG:  Pain in right ankle and joints of right foot   Muscle weakness (generalized)   Difficulty in walking, not elsewhere classified   ONSET  DATE: Surgery 07/02/2021   SUBJECTIVE:    SUBJECTIVE STATEMENT: "I feel awful today."  Pt reports the weather is causing her more pain today.  She states she had xrays of her Lt knee recently.   PERTINENT HISTORY: -gastroc recession Achilles tenolysis (tendon repair) spur resection (07/02/2021) -Rheumatoid syndrome per pt, Osteoporosis, Fibromyalgia, Raynaud's disease, DDD lumbar spine and sciatica, and L knee and LE surgery has rods in distal LE LE    PAIN:  Are you having pain? Yes NPRS: 8/10 Location: generalized   PRECAUTIONS: Other: achilles tendon repair and gastroc recession surgery, osteoporosis, raynaud's disease.  Pt ambulating with cane.   WEIGHT BEARING RESTRICTIONS WBAT   FALLS:  Has patient fallen in last 6 months? Yes, 3 falls.  Pt states the top of her foot gave out.  Last fall less than 1 month ago.     LIVING ENVIRONMENT: Lives with: lives alone Lives in: House/apartment Stairs: 1 story home with 3 steps and bilat rails  Has following equipment at home:  CAM walker boot and 3 pronged cane   OCCUPATION: Pt is on long term disability   PLOF: Independent Pt reports she used a boot for 2.5 years  prior to surgery.  Pt ambulated without a cane.  Pt able to ambulate and perform functional mobility with less pain and increased ease.   PATIENT GOALS  Pt wants to be able to travel.  Improve ambulation and walk without cane     OBJECTIVE:  * Findings taken at [facility] unless otherwise noted.     PATIENT SURVEYS:  FOTO 25 with a goal of 71 at visit #17 03/09/22:  16    COGNITION:           Overall cognitive status: Within functional limits for tasks assessed                          OBSERVATION:             Pt has a small incision on L sided cervical which is closed.  Incisions in distal R LE are well healed     PALPATION: TTP at R achilles > R calf   LE ROM:   AROM/PROM Right 02/01/2022 Right 03/09/2022 Right    Hip flexion       Hip extension       Hip  abduction       Hip adduction       Hip internal rotation       Hip external rotation       Knee flexion       Knee extension       Ankle dorsiflexion 0/10 5/8    Ankle plantarflexion 56     Ankle inversion 39 37 AROM    Ankle eversion 10/14 16 AROM     (Blank rows = not tested)   LE STRENGTH:   MMT Right 02/01/2022 Right 03/09/22  Hip flexion      Hip extension      Hip abduction      Hip adduction      Hip internal rotation      Hip external rotation      Knee flexion      Knee extension      Ankle dorsiflexion Tol min reistance 4-/5 with pain   Ankle plantarflexion Tol min resistance with pain  not tested  Ankle inversion 4/5  4-/5  Ankle eversion 4-/5 4/5    (Blank rows = not tested)       GAIT: Assistive device utilized:  3 pronged cane Comments: Pt favors R LE with increased stance time on L LE.  Pt has decreased Wb'ing thru R LE. Pt has decreased DF and foot clearance with gait.  Pt feels "pulling" in ankle with gait.         TODAY'S TREATMENT: Pt seen for aquatic therapy today.  Treatment took place in water 3.25-4 ft in depth at the [facility] pool. Temp of water was 91.  Pt entered/exited the pool via stairs independently with bilat rail.  -Multiple laps (without UE support) of forward and backward walking, and side stepping.  Focus on rolling through foot/ toes, even stance time and weight shift. -Braiding - forward walking kicks, holding rainbow hand buoys under water -forward/ backward marching  -seated on yellow noodle and holding wall:  cycling (varied speeds), hip abdct/add -seated on 4th step:  5 STS without UE support - heel/toe raises in water over 4 ft; mini squats  - runners R calf stretch; soleus stretch R (while in water of 104)   Self care:  pt instructed in kinesiology tape Rt calf and Achilles area.  Pt verbalized understanding with demo given; returned demo with piece of her own tape.    Pt requires buoyancy for support  and to offload joints with strengthening exercises. Viscosity of the water is needed for resistance of strengthening; water current perturbations provides challenge to standing balance unsupported, requiring increased core activation.  PATIENT EDUCATION:  Education details:  Person educated: Patient Education method: Explanation Education comprehension: verbalized understanding     HOME EXERCISE PROGRAM: [email or web address]   ASSESSMENT:   CLINICAL IMPRESSION: Pt with decreased exercise tolerance today; weather likely attributed to elevated pain level.  She moved in very guarded manner throughout session. She reported reduction in tightness in Rt lower leg after completion of session.  Pt should benefit from skilled PT services including aquatic therapy to address goals, improve ROM and strength, and to assist in improving overall function.  Pt is progressing very gradually towards remaining goals. May be a good candidate for Pool transition program once d/c'd.      OBJECTIVE IMPAIRMENTS Abnormal gait, decreased activity tolerance, decreased endurance, decreased mobility, difficulty walking, decreased ROM, decreased strength, hypomobility, impaired flexibility, and pain.    ACTIVITY LIMITATIONS cleaning, community activity, meal prep, and ambulation .    PERSONAL FACTORS 3+ comorbidities: Rheumatoid syndrome per pt, Osteoporosis, Fibromyalgia, Raynaud's disease, DDD lumbar spine and sciatica,  are also affecting patient's functional outcome.      REHAB POTENTIAL: Good   CLINICAL DECISION MAKING: Evolving/moderate complexity   EVALUATION COMPLEXITY: Moderate     GOALS:     SHORT TERM GOALS: Target date: 03/01/2022   Pt will be independent with HEP.  Baseline: Goal status: MET Target date: 01/14/2022 2.  Pt will improve FOTO to > = 52 to demo improved functional mobility Baseline: 47 -03/09/22 Goal status: In progress  Target date: 03/01/2022 3.  Pt will tolerate gait x 50 ft without AD  with pain < = 3/10 to improve household mobility.   Baseline:  Goal status: NOT MET Target date:  03/01/2022   4.  Pt will ambulate with improved Wb'ing and increased stance time on R LE and no > than a minimal limp   Baseline:  Goal status: Partially met  Target date:  03/01/2022       LONG TERM GOALS: Target date: 05/04/2022    Pt will improve R ankle strength to 4/5 to tolerate gait and mobility Baseline:  Goal status: IN PROGRESS   2.  Pt will be able to ambulate community distance without significant pain or difficulty.  Baseline:  Goal status: IN PROGRESS   3.  Pt will be able to ascend and descend stairs with a reciprocal gait with rail without significant pain.  Baseline:  Goal status: Partially met    4.  Pt will ambulate with = Wb'ing in bilat LE's and without limping for improved community ambulation.  Baseline:  Goal status: In PROGRESS          PLAN: PT FREQUENCY: 2x/week   PT DURATION: 8 weeks   PLANNED INTERVENTIONS: Therapeutic exercises, Therapeutic activity, Neuromuscular re-education, Balance training, Gait training, Patient/Family education, Stair training, DME instructions, Aquatic Therapy, Dry Needling, Electrical stimulation, Cryotherapy, Moist heat, Taping, and Manual therapy   PLAN FOR NEXT SESSION: Cont with gait training, ankle ROM, and ankle strengthening.  Assess goals, including LE strength. Begin d/c planning. Check land HEP and progress.   Kerin Perna, PTA 04/12/22 12:33 PM

## 2022-04-13 ENCOUNTER — Ambulatory Visit (HOSPITAL_BASED_OUTPATIENT_CLINIC_OR_DEPARTMENT_OTHER): Payer: 59 | Admitting: Physical Therapy

## 2022-04-16 ENCOUNTER — Other Ambulatory Visit: Payer: Self-pay | Admitting: Physical Medicine and Rehabilitation

## 2022-04-18 ENCOUNTER — Ambulatory Visit (INDEPENDENT_AMBULATORY_CARE_PROVIDER_SITE_OTHER): Payer: 59

## 2022-04-18 ENCOUNTER — Telehealth: Payer: Self-pay | Admitting: *Deleted

## 2022-04-18 ENCOUNTER — Encounter (HOSPITAL_BASED_OUTPATIENT_CLINIC_OR_DEPARTMENT_OTHER): Payer: Self-pay

## 2022-04-18 ENCOUNTER — Ambulatory Visit (HOSPITAL_BASED_OUTPATIENT_CLINIC_OR_DEPARTMENT_OTHER): Payer: 59 | Admitting: Physical Therapy

## 2022-04-18 DIAGNOSIS — S86011S Strain of right Achilles tendon, sequela: Secondary | ICD-10-CM

## 2022-04-18 DIAGNOSIS — J309 Allergic rhinitis, unspecified: Secondary | ICD-10-CM | POA: Diagnosis not present

## 2022-04-18 DIAGNOSIS — Z9889 Other specified postprocedural states: Secondary | ICD-10-CM

## 2022-04-18 NOTE — Telephone Encounter (Addendum)
Patient is calling because she is having excessive swelling, unable to put  weight on foot, is elevating icing.  Returned call back to patient for more details, no answer,left vmessage for a call back.  Please schedule patient for sooner appointment.  Dr Al Corpus is booked and patient does not wish to see another physician,   Please advise.

## 2022-04-20 ENCOUNTER — Ambulatory Visit (HOSPITAL_BASED_OUTPATIENT_CLINIC_OR_DEPARTMENT_OTHER): Payer: 59 | Admitting: Physical Therapy

## 2022-04-20 ENCOUNTER — Encounter (HOSPITAL_BASED_OUTPATIENT_CLINIC_OR_DEPARTMENT_OTHER): Payer: Self-pay | Admitting: Physical Therapy

## 2022-04-20 DIAGNOSIS — M6281 Muscle weakness (generalized): Secondary | ICD-10-CM

## 2022-04-20 DIAGNOSIS — M25571 Pain in right ankle and joints of right foot: Secondary | ICD-10-CM

## 2022-04-20 DIAGNOSIS — R262 Difficulty in walking, not elsewhere classified: Secondary | ICD-10-CM

## 2022-04-20 NOTE — Therapy (Signed)
OUTPATIENT PHYSICAL THERAPY TREATMENT NOTE   Patient Name: Laura Fields MRN: [id number] DOB:Jan 30, 1961, 60 y.o., female Today's Date: 04/20/2022   PT End of Session - 04/20/22 0758     Visit Number 28    Number of Visits 37    Date for PT Re-Evaluation 05/04/22    PT Start Time 0758    PT Stop Time 0842    PT Time Calculation (min) 44 min    Behavior During Therapy [facility] for tasks assessed/performed             Past Medical History:  Diagnosis Date   Allergy    Anxiety    Arthritis    Asthma    Depression    Fibromyalgia    Intrinsic atopic dermatitis 12/01/2020   Osteoporosis    Raynaud's disease    Sciatica    Past Surgical History:  Procedure Laterality Date   ABDOMINAL HYSTERECTOMY     ACHILLES TENDON REPAIR Right 07/02/2021   AUGMENTATION MAMMAPLASTY Bilateral 1999   Saline/ under muscle   back nerve     block   COLONOSCOPY  09/23/2013   [facility] Endoscopy   colonoscopy with endo  11/30/2021   CYST REMOVAL NECK Left 12/23/2021   GASTRIC BYPASS     KNEE ARTHROSCOPY     mass removable     on thumb   STOMACH SURGERY     Patient Active Problem List   Diagnosis Date Noted   Nerve pain 03/25/2022   Chronic pain syndrome 03/25/2022   Complex regional pain syndrome type 1 of right lower extremity 03/25/2022   Severe persistent asthma without complication 44/92/0100   Seasonal allergic rhinitis due to pollen 12/29/2021   Hand cramps 11/26/2021   Left hand weakness 10/11/2021   Numbness and tingling in left hand 09/02/2021   Gastroesophageal reflux disease 07/28/2021   Other atopic dermatitis 12/01/2020   Pruritic rash 02/12/2020   Allergic conjunctivitis of both eyes 02/12/2020   REFERRING PROVIDER: Garrel Ridgel, DPM   REFERRING DIAG: M76.61 (ICD-10-CM) - Achilles tendinitis of right lower extremity             Z98.890 (ICD-10-CM) - Status post foot surgery  THERAPY DIAG:  Pain in right ankle and joints of right foot   Muscle weakness (generalized)    Difficulty in walking, not elsewhere classified   ONSET DATE: Surgery 07/02/2021   SUBJECTIVE:    SUBJECTIVE STATEMENT: Pt reports she has had increased pain and swelling since last Friday.  She has called her Dr and moved her appt up to see him.  She has tried taping her ankle with little relief.  She has worn boot some.  She arrives without boot, with SPC.   PERTINENT HISTORY: -gastroc recession Achilles tenolysis (tendon repair) spur resection (07/02/2021) -Rheumatoid syndrome per pt, Osteoporosis, Fibromyalgia, Raynaud's disease, DDD lumbar spine and sciatica, and L knee and LE surgery has rods in distal LE LE    PAIN:  Are you having pain? Yes NPRS: 8-9/10 Location: generalized   PRECAUTIONS: Other: achilles tendon repair and gastroc recession surgery, osteoporosis, raynaud's disease.  Pt ambulating with cane.   WEIGHT BEARING RESTRICTIONS WBAT   FALLS:  Has patient fallen in last 6 months? Yes, 3 falls.  Pt states the top of her foot gave out.  Last fall less than 1 month ago.     LIVING ENVIRONMENT: Lives with: lives alone Lives in: House/apartment Stairs: 1 story home with 3 steps and bilat rails  Has following  equipment at home:  CAM walker boot and 3 pronged cane   OCCUPATION: Pt is on long term disability   PLOF: Independent Pt reports she used a boot for 2.5 years prior to surgery.  Pt ambulated without a cane.  Pt able to ambulate and perform functional mobility with less pain and increased ease.   PATIENT GOALS  Pt wants to be able to travel.  Improve ambulation and walk without cane     OBJECTIVE:  * Findings taken at [facility] unless otherwise noted.     PATIENT SURVEYS:  FOTO 25 with a goal of 110 at visit #17 03/09/22:  17    COGNITION:           Overall cognitive status: Within functional limits for tasks assessed                          OBSERVATION:             Pt has a small incision on L sided cervical which is closed.  Incisions in distal R LE are well  healed     PALPATION: TTP at R achilles > R calf   LE ROM:   AROM/PROM Right 02/01/2022 Right 03/09/2022 Right  04/20/2022  Hip flexion       Hip extension       Hip abduction       Hip adduction       Hip internal rotation       Hip external rotation       Knee flexion       Knee extension       Ankle dorsiflexion 0/10 5/8  3 AROM  Ankle plantarflexion 56     Ankle inversion 39 37 AROM  26 AROM  Ankle eversion 10/14 16 AROM  15 AROM   (Blank rows = not tested)   LE STRENGTH:   MMT Right 02/01/2022 Right 03/09/22  Hip flexion      Hip extension      Hip abduction      Hip adduction      Hip internal rotation      Hip external rotation      Knee flexion      Knee extension      Ankle dorsiflexion Tol min reistance 4-/5 with pain   Ankle plantarflexion Tol min resistance with pain  not tested  Ankle inversion 4/5  4-/5  Ankle eversion 4-/5 4/5    (Blank rows = not tested)       GAIT: Assistive device utilized:  3 pronged cane Comments: Pt favors R LE with increased stance time on L LE.  Pt has decreased Wb'ing thru R LE. Pt has decreased DF and foot clearance with gait.  Pt feels "pulling" in ankle with gait.         TODAY'S TREATMENT: Pt seen for aquatic therapy today.  Treatment took place in water 3.25-4 ft in depth at the [facility] pool. Temp of water was 91.  Pt entered/exited the pool via stairs independently with bilat rail.  Multiple laps (without UE support) of forward walking.  Focus on rolling through foot/ toes, even stance time and weight shift. seated on yellow noodle and holding wall:  cycling (varied speeds), hip abdct/add, cc ski holding yellow noodle : Side stepping, Backward walking, high knee forward marching seated on 4th step:  5 STS without UE support Seated on 4th step: heel/ toe raises with inversion/  eversion (moving feet L/R), LAQ with increased DF for stretch to ankle Forward step downs LLE, retro step ups x 5 with  heavy UE support on rails, focus on ROM to knee and ankle runners R calf stretch; soleus stretch R, repeated forward step down LLE x 2 (while in water of 104) - after pt dried off: applied [place] tape to R posterior ankle / achilles with 25% stretch to support area, decompress, increase proprioception. Pt requires buoyancy for support and to offload joints with strengthening exercises. Viscosity of the water is needed for resistance of strengthening; water current perturbations provides challenge to standing balance unsupported, requiring increased core activation.  PATIENT EDUCATION:  Education details: d/c planning - pt to look into facility to transition to completing HEP independently Person educated: Patient Education method: Explanation Education comprehension: verbalized understanding     HOME EXERCISE PROGRAM: [email or web address]   ASSESSMENT:   CLINICAL IMPRESSION: Pt continues with decreased exercise tolerance today.  She moved in slow, guarded manner throughout session, due to elevated pain level.  Exercises mostly performed in 4+ ft of water. . She reported reduction in tightness in Rt lower leg after completion of session. She is observed with more normalized gait without use of SPC at end of session. Began discussing d/c planning; pt to check out her local pool/ gym before next visit.   Pt is progressing very gradually towards remaining goals. May be a good candidate for Pool transition program once d/c'd.      OBJECTIVE IMPAIRMENTS Abnormal gait, decreased activity tolerance, decreased endurance, decreased mobility, difficulty walking, decreased ROM, decreased strength, hypomobility, impaired flexibility, and pain.    ACTIVITY LIMITATIONS cleaning, community activity, meal prep, and ambulation .    PERSONAL FACTORS 3+ comorbidities: Rheumatoid syndrome per pt, Osteoporosis, Fibromyalgia, Raynaud's disease, DDD lumbar spine and sciatica,  are also affecting patient's functional  outcome.      REHAB POTENTIAL: Good   CLINICAL DECISION MAKING: Evolving/moderate complexity   EVALUATION COMPLEXITY: Moderate     GOALS:     SHORT TERM GOALS: Target date: 03/01/2022   Pt will be independent with HEP.  Baseline: Goal status: MET Target date: 01/14/2022 2.  Pt will improve FOTO to > = 52 to demo improved functional mobility Baseline: 47 -03/09/22 Goal status: In progress  Target date: 03/01/2022 3.  Pt will tolerate gait x 50 ft without AD with pain < = 3/10 to improve household mobility.   Baseline:  Goal status: NOT MET Target date:  03/01/2022   4.  Pt will ambulate with improved Wb'ing and increased stance time on R LE and no > than a minimal limp   Baseline:  Goal status: Partially met  Target date:  03/01/2022       LONG TERM GOALS: Target date: 05/04/2022    Pt will improve R ankle strength to 4/5 to tolerate gait and mobility Baseline:  Goal status: IN PROGRESS   2.  Pt will be able to ambulate community distance without significant pain or difficulty.  Baseline:  Goal status: IN PROGRESS   3.  Pt will be able to ascend and descend stairs with a reciprocal gait with rail without significant pain.  Baseline:  Goal status: Partially met    4.  Pt will ambulate with = Wb'ing in bilat LE's and without limping for improved community ambulation.  Baseline:  Goal status: In PROGRESS          PLAN: PT FREQUENCY: 2x/week   PT DURATION: 8  weeks   PLANNED INTERVENTIONS: Therapeutic exercises, Therapeutic activity, Neuromuscular re-education, Balance training, Gait training, Patient/Family education, Stair training, DME instructions, Aquatic Therapy, Dry Needling, Electrical stimulation, Cryotherapy, Moist heat, Taping, and Manual therapy   PLAN FOR NEXT SESSION: Cont with gait training, ankle ROM, and ankle strengthening.  Assess goals, including LE strength. continue d/c planning. Check land HEP and progress.   Kerin Perna,  PTA 04/20/22 10:29 AM

## 2022-04-21 ENCOUNTER — Ambulatory Visit (INDEPENDENT_AMBULATORY_CARE_PROVIDER_SITE_OTHER): Payer: 59

## 2022-04-21 DIAGNOSIS — J4550 Severe persistent asthma, uncomplicated: Secondary | ICD-10-CM

## 2022-04-27 ENCOUNTER — Encounter (HOSPITAL_BASED_OUTPATIENT_CLINIC_OR_DEPARTMENT_OTHER): Payer: Self-pay | Admitting: Physical Therapy

## 2022-04-27 ENCOUNTER — Ambulatory Visit (INDEPENDENT_AMBULATORY_CARE_PROVIDER_SITE_OTHER): Payer: 59

## 2022-04-27 ENCOUNTER — Ambulatory Visit (HOSPITAL_BASED_OUTPATIENT_CLINIC_OR_DEPARTMENT_OTHER): Payer: 59 | Attending: Podiatry | Admitting: Physical Therapy

## 2022-04-27 DIAGNOSIS — M6281 Muscle weakness (generalized): Secondary | ICD-10-CM | POA: Insufficient documentation

## 2022-04-27 DIAGNOSIS — M25571 Pain in right ankle and joints of right foot: Secondary | ICD-10-CM | POA: Diagnosis present

## 2022-04-27 DIAGNOSIS — J309 Allergic rhinitis, unspecified: Secondary | ICD-10-CM

## 2022-04-27 DIAGNOSIS — R262 Difficulty in walking, not elsewhere classified: Secondary | ICD-10-CM | POA: Insufficient documentation

## 2022-04-27 NOTE — Therapy (Addendum)
OUTPATIENT PHYSICAL THERAPY TREATMENT NOTE AND DISCHARGE   Patient Name: Laura Fields MRN: [id number] DOB:[birth year], 61 y.o., female Today's Date: 04/27/2022   PT End of Session - 04/27/22 0737     Visit Number 29    Number of Visits 37    Date for PT Re-Evaluation 05/04/22    PT Start Time 0730    PT Stop Time 0810    PT Time Calculation (min) 40 min    Activity Tolerance Patient limited by pain    Behavior During Therapy [facility] for tasks assessed/performed             Past Medical History:  Diagnosis Date   Allergy    Anxiety    Arthritis    Asthma    Depression    Fibromyalgia    Intrinsic atopic dermatitis 12/01/2020   Osteoporosis    Raynaud's disease    Sciatica    Past Surgical History:  Procedure Laterality Date   ABDOMINAL HYSTERECTOMY     ACHILLES TENDON REPAIR Right 07/02/2021   AUGMENTATION MAMMAPLASTY Bilateral 1999   Saline/ under muscle   back nerve     block   COLONOSCOPY  09/23/2013   [facility]   colonoscopy with endo  11/30/2021   CYST REMOVAL NECK Left 12/23/2021   GASTRIC BYPASS     KNEE ARTHROSCOPY     mass removable     on thumb   STOMACH SURGERY     Patient Active Problem List   Diagnosis Date Noted   Nerve pain 03/25/2022   Chronic pain syndrome 03/25/2022   Complex regional pain syndrome type 1 of right lower extremity 03/25/2022   Severe persistent asthma without complication 73/41/9379   Seasonal allergic rhinitis due to pollen 12/29/2021   Hand cramps 11/26/2021   Left hand weakness 10/11/2021   Numbness and tingling in left hand 09/02/2021   Gastroesophageal reflux disease 07/28/2021   Other atopic dermatitis 12/01/2020   Pruritic rash 02/12/2020   Allergic conjunctivitis of both eyes 02/12/2020   REFERRING PROVIDER: Garrel Ridgel, DPM   REFERRING DIAG: M76.61 (ICD-10-CM) - Achilles tendinitis of right lower extremity             Z98.890 (ICD-10-CM) - Status post foot surgery  THERAPY DIAG:  Pain in right ankle  and joints of right foot   Muscle weakness (generalized)   Difficulty in walking, not elsewhere classified   ONSET DATE: Surgery 07/02/2021   SUBJECTIVE:    SUBJECTIVE STATEMENT: Pt reports she has been mostly in bed since Friday.  "It's been swollen. It's been spasming and locking up more.  It's been difficult".  She reports she is still doing her exercises, even though they are painful.  She voices frustration with ongoing mobility issues and pain. She is awaiting Rock tape that she ordered.  She returns to her surgeon tomorrow.    PERTINENT HISTORY: -gastroc recession Achilles tenolysis (tendon repair) spur resection (07/02/2021) -Rheumatoid syndrome per pt, Osteoporosis, Fibromyalgia, Raynaud's disease, DDD lumbar spine and sciatica, and L knee and LE surgery has rods in distal LE LE    PAIN:  Are you having pain? Yes NPRS: 7/10 Location: generalized, mostly posterior R ankle   PRECAUTIONS: Other: achilles tendon repair and gastroc recession surgery, osteoporosis, raynaud's disease.  Pt ambulating with cane.   WEIGHT BEARING RESTRICTIONS WBAT   FALLS:  Has patient fallen in last 6 months? Yes, 3 falls.  Pt states the top of her foot gave out.  Last fall  less than 1 month ago.     LIVING ENVIRONMENT: Lives with: lives alone Lives in: House/apartment Stairs: 1 story home with 3 steps and bilat rails  Has following equipment at home:  CAM walker boot and 3 pronged cane   OCCUPATION: Pt is on long term disability   PLOF: Independent Pt reports she used a boot for 2.5 years prior to surgery.  Pt ambulated without a cane.  Pt able to ambulate and perform functional mobility with less pain and increased ease.   PATIENT GOALS  Pt wants to be able to travel.  Improve ambulation and walk without cane     OBJECTIVE:  * Findings taken at [facility] unless otherwise noted.     PATIENT SURVEYS:  FOTO 25 with a goal of 59 at visit #17 03/09/22:  25    COGNITION:           Overall cognitive  status: Within functional limits for tasks assessed                          OBSERVATION:             Pt has a small incision on L sided cervical which is closed.  Incisions in distal R LE are well healed     PALPATION: TTP at R achilles > R calf   LE ROM:   AROM/PROM Right 02/01/2022 Right 03/09/2022 Right  04/20/2022  Hip flexion       Hip extension       Hip abduction       Hip adduction       Hip internal rotation       Hip external rotation       Knee flexion       Knee extension       Ankle dorsiflexion 0/10 5/8  3 AROM  Ankle plantarflexion 56     Ankle inversion 39 37 AROM  26 AROM  Ankle eversion 10/14 16 AROM  15 AROM   (Blank rows = not tested)   LE STRENGTH:   MMT Right 02/01/2022 Right 03/09/22  Hip flexion      Hip extension      Hip abduction      Hip adduction      Hip internal rotation      Hip external rotation      Knee flexion      Knee extension      Ankle dorsiflexion Tol min reistance 4-/5 with pain   Ankle plantarflexion Tol min resistance with pain  not tested  Ankle inversion 4/5  4-/5  Ankle eversion 4-/5 4/5    (Blank rows = not tested)       GAIT: Assistive device utilized:  3 pronged cane Comments: Pt favors R LE with increased stance time on L LE.  Pt has decreased Wb'ing thru R LE. Pt has decreased DF and foot clearance with gait.  Pt feels "pulling" in ankle with gait.         TODAY'S TREATMENT: Pt seen for aquatic therapy today.  Treatment took place in water 3.25-4 ft in depth at the [employer] [facility] pool. Temp of water was 91.  Pt entered/exited the pool via stairs independently with bilat rail.  Multiple laps (without UE support) of forward walking.  Focus on rolling through foot/ toes, even stance time and weight shift. seated on yellow noodle and holding wall:  cycling (varied speeds), hip abdct/add, cc ski  seated on 4th step:  5 STS without UE support Seated on 4th step: heel/ toe raises with inversion/  eversion (moving feet L/R) Forward lunges with foot on 2nd step for knee/ankle ROM x 5 each leg Returned to backward walking (limited to 1/2 width due to spasm) and side stepping 1 lap Holding wall: Tap behinds (similar to curtsy), Hip openers (hip abdct/knee flexion), forward kicks (LAQ to DF), hamstring curls, alternating hip abdct Forward/backward walking with increased speed Side squat  - after pt dried off: applied Reg Rock tape to R posterior ankle / achilles with 25% stretch to support area, decompress, increase proprioception. Pt requires buoyancy for support and to offload joints with strengthening exercises. Viscosity of the water is needed for resistance of strengthening; water current perturbations provides challenge to standing balance unsupported, requiring increased core activation.  PATIENT EDUCATION:  Education details: d/c planning - pt to look into facility to transition to completing HEP independently Person educated: Patient Education method: Explanation Education comprehension: verbalized understanding     HOME EXERCISE PROGRAM: [email or web address]   ASSESSMENT:   CLINICAL IMPRESSION: Pt continues to move in slow, guarded manner especially at beginning of session, due to elevated pain level and frequent spasming.  Exercises mostly performed in 4+ ft of water. She reported reduction in tightness in Rt lower leg after completion of session and was able to increase speed slightly towards end of session. May be a good candidate for Pool transition program once d/c'd.      OBJECTIVE IMPAIRMENTS Abnormal gait, decreased activity tolerance, decreased endurance, decreased mobility, difficulty walking, decreased ROM, decreased strength, hypomobility, impaired flexibility, and pain.    ACTIVITY LIMITATIONS cleaning, community activity, meal prep, and ambulation .    PERSONAL FACTORS 3+ comorbidities: Rheumatoid syndrome per pt, Osteoporosis, Fibromyalgia, Raynaud's disease, DDD lumbar  spine and sciatica,  are also affecting patient's functional outcome.      REHAB POTENTIAL: Good   CLINICAL DECISION MAKING: Evolving/moderate complexity   EVALUATION COMPLEXITY: Moderate     GOALS:     SHORT TERM GOALS: Target date: 03/01/2022   Pt will be independent with HEP.  Baseline: Goal status: MET Target date: 01/14/2022 2.  Pt will improve FOTO to > = 52 to demo improved functional mobility Baseline: 47 -03/09/22 Goal status: In progress  Target date: 03/01/2022 3.  Pt will tolerate gait x 50 ft without AD with pain < = 3/10 to improve household mobility.   Baseline:  Goal status: NOT MET Target date:  03/01/2022   4.  Pt will ambulate with improved Wb'ing and increased stance time on R LE and no > than a minimal limp   Baseline:  Goal status: Partially met  Target date:  03/01/2022       LONG TERM GOALS: Target date: 05/04/2022    Pt will improve R ankle strength to 4/5 to tolerate gait and mobility Baseline:  Goal status: IN PROGRESS   2.  Pt will be able to ambulate community distance without significant pain or difficulty.  Baseline:  Goal status: IN PROGRESS   3.  Pt will be able to ascend and descend stairs with a reciprocal gait with rail without significant pain.  Baseline: able to complete, but has increase in pain Goal status: Partially met    4.  Pt will ambulate with = Wb'ing in bilat LE's and without limping for improved community ambulation.  Baseline:  Goal status: In PROGRESS          PLAN: PT  FREQUENCY: 2x/week   PT DURATION: 8 weeks   PLANNED INTERVENTIONS: Therapeutic exercises, Therapeutic activity, Neuromuscular re-education, Balance training, Gait training, Patient/Family education, Stair training, DME instructions, Aquatic Therapy, Dry Needling, Electrical stimulation, Cryotherapy, Moist heat, Taping, and Manual therapy   PLAN FOR NEXT SESSION:  Assess goals, including LE strength. d/c planning. Check land HEP and progress.    PHYSICAL THERAPY DISCHARGE SUMMARY  Visits from Start of Care: 29  Current functional level related to goals / functional outcomes: Improving mobility, strength and gait   Remaining deficits: See above   Education / Equipment: HEP for land and aquatics   Patient agrees to discharge. Patient goals were partially met. Patient is being discharged due to not returning since the last visit.  Isabelle Course, PT,DPT03/12/249:38 AM  Kerin Perna, PTA 04/27/22 8:07 AM

## 2022-04-28 ENCOUNTER — Ambulatory Visit (INDEPENDENT_AMBULATORY_CARE_PROVIDER_SITE_OTHER): Payer: 59 | Admitting: Podiatry

## 2022-04-28 ENCOUNTER — Encounter: Payer: Self-pay | Admitting: Podiatry

## 2022-04-28 DIAGNOSIS — G90521 Complex regional pain syndrome I of right lower limb: Secondary | ICD-10-CM | POA: Diagnosis not present

## 2022-04-28 DIAGNOSIS — S86011S Strain of right Achilles tendon, sequela: Secondary | ICD-10-CM

## 2022-04-28 DIAGNOSIS — Z9889 Other specified postprocedural states: Secondary | ICD-10-CM

## 2022-04-28 MED ORDER — OXYCODONE-ACETAMINOPHEN 10-325 MG PO TABS: 1.0000 | ORAL_TABLET | ORAL | 0 refills | Status: DC | PRN

## 2022-04-28 MED ORDER — GABAPENTIN 300 MG PO CAPS: 300.0000 mg | ORAL_CAPSULE | Freq: Three times a day (TID) | ORAL | 3 refills | Status: DC

## 2022-04-28 NOTE — Progress Notes (Addendum)
She presents today for follow-up of her MRI states that this foot and leg is just making me crazy is just not getting any better she says I feel like going backwards rather than forwards.  She continues to see her doctor at physical medicine who is recommended that she continue her Lyrica and discontinue the gabapentin.  She states that the gabapentin really seem to be helping me once I have been off of it.  She states that she has not been seen by neurosurgery or neurology.  States that it just hurts to touch hurts to touch the skin it is getting weaker back in my boot after all these months and walking with a cane.  She also has a history of fibromyalgia and history of degenerative disc disease in her back diagnosed many years ago and has had multiple injections.  Objective: Vital signs are stable she is alert and oriented x3.  Presents today with KT tape and her foot is exquisitely sensitive with severe allodynia currently it is not swollen but she produces a picture from her cell phone which does demonstrate what appears to be her leg that is swollen.  MRI demonstrates only thickening of the Achilles tendon at its distalmost aspect no inflammation most likely scar and no interstitial tears present.  Does relate some Planter fasciitis though.  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  Plan: At this point I am putting her back on her gabapentin 300 mg 3 times a day and I am going to request that she be seen by Dr. Lorrine Kin from [facility] to be considered for a spinal stimulator.

## 2022-05-02 ENCOUNTER — Ambulatory Visit (INDEPENDENT_AMBULATORY_CARE_PROVIDER_SITE_OTHER): Payer: 59

## 2022-05-02 ENCOUNTER — Ambulatory Visit (HOSPITAL_BASED_OUTPATIENT_CLINIC_OR_DEPARTMENT_OTHER): Payer: 59 | Admitting: Physical Therapy

## 2022-05-02 DIAGNOSIS — J309 Allergic rhinitis, unspecified: Secondary | ICD-10-CM

## 2022-05-04 ENCOUNTER — Ambulatory Visit (HOSPITAL_BASED_OUTPATIENT_CLINIC_OR_DEPARTMENT_OTHER): Payer: 59 | Admitting: Physical Therapy

## 2022-05-04 ENCOUNTER — Encounter (HOSPITAL_BASED_OUTPATIENT_CLINIC_OR_DEPARTMENT_OTHER): Payer: Self-pay | Admitting: Physical Therapy

## 2022-05-09 ENCOUNTER — Telehealth: Payer: Self-pay | Admitting: Podiatry

## 2022-05-09 DIAGNOSIS — G90521 Complex regional pain syndrome I of right lower limb: Secondary | ICD-10-CM

## 2022-05-09 NOTE — Telephone Encounter (Signed)
[facility] states that they received a referral but is not in network with pts insurance [employer].   Please advise

## 2022-05-10 ENCOUNTER — Ambulatory Visit (HOSPITAL_BASED_OUTPATIENT_CLINIC_OR_DEPARTMENT_OTHER): Payer: 59 | Admitting: Physical Therapy

## 2022-05-10 NOTE — Telephone Encounter (Signed)
Error message

## 2022-05-11 ENCOUNTER — Telehealth: Payer: Self-pay | Admitting: Podiatry

## 2022-05-11 ENCOUNTER — Other Ambulatory Visit: Payer: Self-pay | Admitting: Podiatry

## 2022-05-11 ENCOUNTER — Ambulatory Visit (INDEPENDENT_AMBULATORY_CARE_PROVIDER_SITE_OTHER): Payer: 59

## 2022-05-11 DIAGNOSIS — J309 Allergic rhinitis, unspecified: Secondary | ICD-10-CM | POA: Diagnosis not present

## 2022-05-11 MED ORDER — OXYCODONE-ACETAMINOPHEN 10-325 MG PO TABS: 1.0000 | ORAL_TABLET | Freq: Three times a day (TID) | ORAL | 0 refills | Status: AC | PRN

## 2022-05-11 NOTE — Telephone Encounter (Signed)
Pt called stating she has yet to hear back from pain management and the neurosurgeon.   She is also requesting a refill on oxyCODONE-acetaminophen (PERCOCET) 10-325 MG tablet  Please advise.

## 2022-05-11 NOTE — Addendum Note (Signed)
Addended by: Lottie Rater E on: 05/11/2022 05:10 PM   Modules accepted: Orders

## 2022-05-11 NOTE — Telephone Encounter (Signed)
Pain meds refill and patient notified

## 2022-05-12 ENCOUNTER — Ambulatory Visit: Payer: 59 | Admitting: Podiatry

## 2022-05-13 ENCOUNTER — Ambulatory Visit (HOSPITAL_BASED_OUTPATIENT_CLINIC_OR_DEPARTMENT_OTHER): Payer: 59 | Admitting: Physical Therapy

## 2022-05-17 ENCOUNTER — Ambulatory Visit (INDEPENDENT_AMBULATORY_CARE_PROVIDER_SITE_OTHER): Payer: 59

## 2022-05-17 DIAGNOSIS — J309 Allergic rhinitis, unspecified: Secondary | ICD-10-CM | POA: Diagnosis not present

## 2022-05-19 ENCOUNTER — Ambulatory Visit (INDEPENDENT_AMBULATORY_CARE_PROVIDER_SITE_OTHER): Payer: 59

## 2022-05-19 ENCOUNTER — Other Ambulatory Visit: Payer: Self-pay | Admitting: Podiatry

## 2022-05-19 DIAGNOSIS — J4550 Severe persistent asthma, uncomplicated: Secondary | ICD-10-CM | POA: Diagnosis not present

## 2022-05-19 MED ORDER — OXYCODONE-ACETAMINOPHEN 10-325 MG PO TABS: 1.0000 | ORAL_TABLET | Freq: Three times a day (TID) | ORAL | 0 refills | Status: AC | PRN

## 2022-05-26 ENCOUNTER — Ambulatory Visit (INDEPENDENT_AMBULATORY_CARE_PROVIDER_SITE_OTHER): Payer: Managed Care, Other (non HMO) | Admitting: Family Medicine

## 2022-05-26 ENCOUNTER — Telehealth: Payer: Self-pay

## 2022-05-26 ENCOUNTER — Other Ambulatory Visit: Payer: Self-pay | Admitting: Podiatry

## 2022-05-26 ENCOUNTER — Other Ambulatory Visit: Payer: Self-pay | Admitting: Family Medicine

## 2022-05-26 ENCOUNTER — Encounter: Payer: Self-pay | Admitting: Family Medicine

## 2022-05-26 DIAGNOSIS — J3089 Other allergic rhinitis: Secondary | ICD-10-CM | POA: Diagnosis not present

## 2022-05-26 DIAGNOSIS — L2089 Other atopic dermatitis: Secondary | ICD-10-CM | POA: Diagnosis not present

## 2022-05-26 DIAGNOSIS — J4550 Severe persistent asthma, uncomplicated: Secondary | ICD-10-CM | POA: Diagnosis not present

## 2022-05-26 DIAGNOSIS — H1013 Acute atopic conjunctivitis, bilateral: Secondary | ICD-10-CM

## 2022-05-26 DIAGNOSIS — K219 Gastro-esophageal reflux disease without esophagitis: Secondary | ICD-10-CM

## 2022-05-26 DIAGNOSIS — J302 Other seasonal allergic rhinitis: Secondary | ICD-10-CM | POA: Insufficient documentation

## 2022-05-26 MED ORDER — OXYCODONE-ACETAMINOPHEN 10-325 MG PO TABS: 1.0000 | ORAL_TABLET | Freq: Three times a day (TID) | ORAL | 0 refills | Status: AC | PRN

## 2022-05-26 MED ORDER — BUDESONIDE 0.5 MG/2ML IN SUSP: 0.5000 mg | Freq: Two times a day (BID) | RESPIRATORY_TRACT | 2 refills | Status: AC

## 2022-05-26 MED ORDER — LEVOCETIRIZINE DIHYDROCHLORIDE 5 MG PO TABS
5.0000 mg | ORAL_TABLET | Freq: Every evening | ORAL | 5 refills | Status: DC
Start: 2022-05-26 — End: 2022-09-14

## 2022-05-26 MED ORDER — TRIAMCINOLONE ACETONIDE 0.1 % EX OINT
1.0000 | TOPICAL_OINTMENT | Freq: Two times a day (BID) | CUTANEOUS | 0 refills | Status: DC
Start: 2022-05-26 — End: 2022-09-30

## 2022-05-26 MED ORDER — ALBUTEROL SULFATE HFA 108 (90 BASE) MCG/ACT IN AERS: INHALATION_SPRAY | RESPIRATORY_TRACT | 1 refills | Status: DC

## 2022-05-26 MED ORDER — BREZTRI AEROSPHERE 160-9-4.8 MCG/ACT IN AERO
2.0000 | INHALATION_SPRAY | Freq: Two times a day (BID) | RESPIRATORY_TRACT | 3 refills | Status: DC
Start: 2022-05-26 — End: 2022-06-08

## 2022-05-26 MED ORDER — AZELASTINE HCL 0.1 % NA SOLN: 2.0000 | Freq: Two times a day (BID) | NASAL | 5 refills | Status: DC | PRN

## 2022-05-26 NOTE — Patient Instructions (Addendum)
Asthma Continue Breztri 2 puffs twice a day with a spacer to prevent cough or wheeze Continue montelukast 10 mg once a day to prevent cough or wheeze Continue albuterol 2 puffs every 4 hours as needed for cough or wheeze OR Instead use albuterol 0.083% solution via nebulizer one unit vial every 4 hours as needed for cough or wheeze Continue Tezspire injections for asthma control  Allergic rhinitis Continue avoidance measures directed toward grass pollen and weed pollen as listed below Continue allergen immunotherapy per protocol and have access to an epinephrine autoinjector set Continue Xyzal 5 mg once a day as needed for a runny nose or itch. This will replace carbinoxamine.  Remember to rotate to a different antihistamine about every 3 months. Some examples of over the counter antihistamines include Zyrtec (cetirizine), Xyzal (levocetirizine), Allegra (fexofenadine), and Claritin (loratidine).  Continue azelastine 2 sprays in each nostril twice a day as needed for a runny nose Continue flunisolide- 2 sprays in each nostril 2 to 3 times a day.  In the right nostril, point the applicator out toward the right ear. In the left nostril, point the applicator out toward the left ear Consider saline nasal rinses as needed for nasal symptoms. Use this before any medicated nasal sprays for best result  Allergic conjunctivitis Continue epinastine eye drops 1 drop in each eye twice a day as needed for red, itchy eyes.  Atopic dermatitis Continue a daily moisturizing routine Continue desonide 0.05% ointment to red, itchy areas twice a day as needed. Do not use this medication longer than 3 weeks in a row.  Begin triamcinolone 0.1% ointment to red and itchy areas below your face up to twice a day as needed.  Do not use this medication for longer than 2 weeks in a row.  Reflux Continue dietary lifestyle modifications as listed below Continue to follow up with your gastroenterologist for evaluation and  treatment of GI issues  Call the clinic if this treatment plan is not working well for you  Follow up in the clinic 2 months or sooner if needed.  Reducing Pollen Exposure The American Academy of Allergy, Asthma and Immunology suggests the following steps to reduce your exposure to pollen during allergy seasons. Do not hang sheets or clothing out to dry; pollen may collect on these items. Do not mow lawns or spend time around freshly cut grass; mowing stirs up pollen. Keep windows closed at night.  Keep car windows closed while driving. Minimize morning activities outdoors, a time when pollen counts are usually at their highest. Stay indoors as much as possible when pollen counts or humidity is high and on windy days when pollen tends to remain in the air longer. Use air conditioning when possible.  Many air conditioners have filters that trap the pollen spores. Use a HEPA room air filter to remove pollen form the indoor air you breathe.   Lifestyle Changes for Controlling GERD When you have GERD, stomach acid feels as if it's backing up toward your mouth. Whether or not you take medication to control your GERD, your symptoms can often be improved with lifestyle changes.   Raise Your Head Reflux is more likely to strike when you're lying down flat, because stomach fluid can flow backward more easily. Raising the head of your bed 4-6 inches can help. To do this: Slide blocks or books under the legs at the head of your bed. Or, place a wedge under the mattress. Many foam stores can make a suitable wedge  for you. The wedge should run from your waist to the top of your head. Don't just prop your head on several pillows. This increases pressure on your stomach. It can make GERD worse.  Watch Your Eating Habits Certain foods may increase the acid in your stomach or relax the lower esophageal sphincter, making GERD more likely. It's best to avoid the following: Coffee, tea, and carbonated  drinks (with and without caffeine) Fatty, fried, or spicy food Mint, chocolate, onions, and tomatoes Any other foods that seem to irritate your stomach or cause you pain  Relieve the Pressure Eat smaller meals, even if you have to eat more often. Don't lie down right after you eat. Wait a few hours for your stomach to empty. Avoid tight belts and tight-fitting clothes. Lose excess weight.  Tobacco and Alcohol Avoid smoking tobacco and drinking alcohol. They can make GERD symptoms worse.

## 2022-05-26 NOTE — Telephone Encounter (Signed)
No further information has been given at this time. 

## 2022-05-26 NOTE — Progress Notes (Addendum)
RE: Laura Fields MRN: [id number] DOB: September 26, 1961 Date of Telemedicine Visit: 05/26/2022  Referring provider: Olive Bass,* Primary care provider: Olive Bass, FNP  Chief Complaint: Follow-up (Tele visit. Pt states she have been coughing and a lot. Refills all meds )   Telemedicine Follow Up Visit via Telephone: I connected with Laura Fields for a follow up on 05/26/22 by telephone and verified that I am speaking with the correct person using two identifiers.   I discussed the limitations, risks, security and privacy concerns of performing an evaluation and management service by telephone and the availability of in person appointments. I also discussed with the patient that there may be a patient responsible charge related to this service. The patient expressed understanding and agreed to proceed.  Patient is at home  Provider is at the office Visit start time: 1106 Visit end time: 1133 Insurance consent/check in by: [place] Medical consent and medical assistant/nurse: Eulah Citizen   History of Present Illness: She is a 61 y.o. female, who is being followed for asthma, allergic rhinitis on allergen immunotherapy, allergic conjunctivitis, atopic dermatitis, and reflux.. Her previous allergy office visit was on 03/15/2022 with Dr. Selena Batten.  At today's visit, she reports her asthma has been moderately well controlled with occasional wheeze and occasional cough producing clear mucus that began about a week ago.  She denies shortness of breath with activity and rest.  She denies fever, sweats, chills, or sick contacts. She continues montelukast 10 mg once a day, Breztri 2 puffs twice a day with a spacer, and rarely uses albuterol.  She has not needed to use Pulmicort for asthma flare since her last visit to this clinic.  She continues Tezspire injections once every 4 weeks with no large or local reactions.  She reports a significant decrease in her asthma symptoms while continuing to  receive  Tezspire injections.  Allergic rhinitis is reported as moderately well controlled with nasal congestion and postnasal drainage occurring in the morning and in the evening for which she spends about 10 to 15 minutes blowing her nose with relief of symptoms.  She continues Xyzal, azelastine, and flunisolide daily.  She continues allergen immunotherapy with no larger local reactions.  She reports a significant decrease in her symptoms of allergic rhinitis while continuing on allergen immunotherapy.  Allergic conjunctivitis is reported as well controlled with occasional allergy eyedrops.  Atopic dermatitis is reported as moderately well controlled with breakouts occurring on her legs in a flare in remission pattern.  She continues a daily moisturizing routine as well as desonide with mild relief of symptoms.  Reflux is reported as well controlled with no symptoms including vomiting or heartburn pantoprazole daily.  Her current medications are listed in the chart.   Assessment and Plan: Laura Fields is a 61 y.o. female with: Patient Instructions  Asthma Continue Breztri 2 puffs twice a day with a spacer to prevent cough or wheeze Continue montelukast 10 mg once a day to prevent cough or wheeze Continue albuterol 2 puffs every 4 hours as needed for cough or wheeze OR Instead use albuterol 0.083% solution via nebulizer one unit vial every 4 hours as needed for cough or wheeze Continue Tezspire injections for asthma control  Allergic rhinitis Continue avoidance measures directed toward grass pollen and weed pollen as listed below Continue allergen immunotherapy per protocol and have access to an epinephrine autoinjector set Continue Xyzal 5 mg once a day as needed for a runny nose or itch. This will replace carbinoxamine.  Remember to rotate to a different antihistamine about every 3 months. Some examples of over the counter antihistamines include Zyrtec (cetirizine), Xyzal (levocetirizine), Allegra  (fexofenadine), and Claritin (loratidine).  Continue azelastine 2 sprays in each nostril twice a day as needed for a runny nose Continue flunisolide- 2 sprays in each nostril 2 to 3 times a day.  In the right nostril, point the applicator out toward the right ear. In the left nostril, point the applicator out toward the left ear Consider saline nasal rinses as needed for nasal symptoms. Use this before any medicated nasal sprays for best result  Allergic conjunctivitis Continue epinastine eye drops 1 drop in each eye twice a day as needed for red, itchy eyes.  Atopic dermatitis Continue a daily moisturizing routine Continue desonide 0.05% ointment to red, itchy areas twice a day as needed. Do not use this medication longer than 3 weeks in a row.  Begin triamcinolone 0.1% ointment to red and itchy areas below your face up to twice a day as needed.  Do not use this medication for longer than 2 weeks in a row.  Reflux Continue dietary lifestyle modifications as listed below Continue to follow up with your gastroenterologist for evaluation and treatment of GI issues  Call the clinic if this treatment plan is not working well for you  Follow up in the clinic 2 months or sooner if needed.   Return in about 2 months (around 07/26/2022), or if symptoms worsen or fail to improve.  Meds ordered this encounter  Medications   triamcinolone ointment (KENALOG) 0.1 %    Sig: Apply 1 Application topically 2 (two) times daily.    Dispense:  30 g    Refill:  0   albuterol (VENTOLIN HFA) 108 (90 Base) MCG/ACT inhaler    Sig: 2 puff into the lungs every 4 hrs as needed for wheeze or shortness of breath.    Dispense:  18 each    Refill:  1    Courtesy refill until next appointment.   azelastine (ASTELIN) 0.1 % nasal spray    Sig: Place 2 sprays into both nostrils 2 (two) times daily as needed for rhinitis.    Dispense:  30 mL    Refill:  5   Budeson-Glycopyrrol-Formoterol (BREZTRI AEROSPHERE)  160-9-4.8 MCG/ACT AERO    Sig: Inhale 2 puffs into the lungs in the morning and at bedtime. with spacer and rinse mouth afterwards.    Dispense:  10.7 g    Refill:  3   budesonide (PULMICORT) 0.5 MG/2ML nebulizer solution    Sig: Take 2 mLs (0.5 mg total) by nebulization in the morning and at bedtime. For the next 2 weeks and use during asthma flares.    Dispense:  120 mL    Refill:  2   levocetirizine (XYZAL) 5 MG tablet    Sig: Take 1 tablet (5 mg total) by mouth every evening.    Dispense:  34 tablet    Refill:  5   Lab Orders  No laboratory test(s) ordered today    Diagnostics: None.  Medication List:  Current Outpatient Medications  Medication Sig Dispense Refill   albuterol (PROVENTIL) (2.5 MG/3ML) 0.083% nebulizer solution Take 3 mLs (2.5 mg total) by nebulization every 4 (four) hours as needed for wheezing or shortness of breath (coughing fits). 75 mL 1   AMBULATORY NON FORMULARY MEDICATION Apply 1 application topically at bedtime. Medication Name: Compounded Hydroquinone 8%, Tretinoin 0.025%, Kojic Acid 1%, Niacinamide 4%, Fluocinolone 0.025%  Cream ([employer]) 30 g 0   benzonatate (TESSALON) 100 MG capsule Take 2 capsules (200 mg total) by mouth 3 (three) times daily as needed for cough. 60 capsule 0   cyclobenzaprine (FLEXERIL) 10 MG tablet Take 1 tablet (10 mg total) by mouth at bedtime as needed for muscle spasms. Can take at night AS NEEDED FOR muscle spasms 30 tablet 5   desonide (DESOWEN) 0.05 % ointment APPLY 1 APPLICATION TOPICALLY 2 TIMES DAILY AS NEEDED TO RED ITCHY AREAS TO THE FACE. 45 g 0   DULoxetine (CYMBALTA) 60 MG capsule Take 1 capsule (60 mg total) by mouth daily. 90 capsule 1   Epinastine HCl 0.05 % ophthalmic solution Apply to eye as needed.     EPINEPHrine (EPIPEN 2-PAK) 0.3 mg/0.3 mL IJ SOAJ injection Inject 0.3 mg into the muscle as needed for anaphylaxis. 1 each 1   EPINEPHrine 0.3 mg/0.3 mL IJ SOAJ injection Use as directed as severe allergic  reactions 2 each 1   famotidine (PEPCID) 20 MG tablet TAKE 1 TABLET BY MOUTH TWICE A DAY 180 tablet 0   flunisolide (NASALIDE) 25 MCG/ACT (0.025%) SOLN 2 SPRAYS PER NOSTRIL 2-3 TIMES PER DAY AS NEEDED FOR STUFFY NOSE. 75 mL 0   gabapentin (NEURONTIN) 300 MG capsule Take 1 capsule (300 mg total) by mouth 3 (three) times daily. 180 capsule 3   hydrOXYzine (ATARAX/VISTARIL) 25 MG tablet Take 1 tablet (25 mg total) by mouth 3 (three) times daily as needed for itching. 60 tablet 0   meloxicam (MOBIC) 15 MG tablet Take 15 mg by mouth daily.     ondansetron (ZOFRAN) 4 MG tablet Take 1 tablet (4 mg total) by mouth every 8 (eight) hours as needed. 20 tablet 0   pantoprazole (PROTONIX) 40 MG tablet TAKE 1 TABLET BY MOUTH EVERY DAY 90 tablet 2   phenazopyridine (PYRIDIUM) 100 MG tablet Take 1 tablet (100 mg total) by mouth 3 (three) times daily as needed for pain. 6 tablet 0   polyethylene glycol powder (GLYCOLAX/MIRALAX) 17 GM/SCOOP powder Take 17g daily 255 g 3   pregabalin (LYRICA) 150 MG capsule Take 1 capsule (150 mg total) by mouth at bedtime. X 4 days then 150 mg BID x 4 days then TID- for nerve pain/CRPS 90 capsule 5   tiZANidine (ZANAFLEX) 4 MG tablet Take 0.5-1 tablets (2-4 mg total) by mouth 2 (two) times daily. For muscle spasms- use scheduled 60 tablet 5   traZODone (DESYREL) 50 MG tablet TAKE 1-2 TABLETS BY MOUTH AT BEDTIME. 180 tablet 2   triamcinolone ointment (KENALOG) 0.1 % Apply 1 Application topically 2 (two) times daily. 30 g 0   albuterol (VENTOLIN HFA) 108 (90 Base) MCG/ACT inhaler 2 puff into the lungs every 4 hrs as needed for wheeze or shortness of breath. 18 each 1   azelastine (ASTELIN) 0.1 % nasal spray Place 2 sprays into both nostrils 2 (two) times daily as needed for rhinitis. 30 mL 5   baclofen (LIORESAL) 10 MG tablet Take 10 mg by mouth 3 (three) times daily. (Patient not taking: Reported on 05/26/2022)     Budeson-Glycopyrrol-Formoterol (BREZTRI AEROSPHERE) 160-9-4.8 MCG/ACT  AERO Inhale 2 puffs into the lungs in the morning and at bedtime. with spacer and rinse mouth afterwards. 10.7 g 3   budesonide (PULMICORT) 0.5 MG/2ML nebulizer solution Take 2 mLs (0.5 mg total) by nebulization in the morning and at bedtime. For the next 2 weeks and use during asthma flares. 120 mL 2   cyclobenzaprine (  FLEXERIL) 5 MG tablet Take 5-10 mg by mouth at bedtime. (Patient not taking: Reported on 05/26/2022)     levocetirizine (XYZAL) 5 MG tablet Take 1 tablet (5 mg total) by mouth every evening. 34 tablet 5   montelukast (SINGULAIR) 10 MG tablet TAKE 1 TABLET BY MOUTH EVERYDAY AT BEDTIME (Patient not taking: Reported on 05/26/2022) 90 tablet 1   oxyCODONE-acetaminophen (PERCOCET) 10-325 MG tablet Take 1 tablet by mouth every 4 (four) hours as needed for pain. (Patient not taking: Reported on 05/26/2022) 30 tablet 0   oxyCODONE-acetaminophen (PERCOCET) 10-325 MG tablet Take 1 tablet by mouth every 8 (eight) hours as needed for up to 7 days for pain. (Patient not taking: Reported on 05/26/2022) 21 tablet 0   oxyCODONE-acetaminophen (PERCOCET) 10-325 MG tablet Take 1 tablet by mouth every 8 (eight) hours as needed for up to 7 days for pain. 21 tablet 0   pregabalin (LYRICA) 150 MG capsule Take 150 mg by mouth 2 (two) times daily. (Patient not taking: Reported on 05/26/2022)     Current Facility-Administered Medications  Medication Dose Route Frequency Provider Last Rate Last Admin   tezepelumab-ekko (TEZSPIRE) 210 MG/1. syringe 210 mg  210 mg Subcutaneous Q28 days Ellamae Sia, DO   210 mg at 05/19/22 1108   Allergies: Allergies  Allergen Reactions   Rocephin [Ceftriaxone Sodium In Dextrose] Hives   I reviewed her past medical history, social history, family history, and environmental history and no significant changes have been reported from previous visit on 03/15/2022.   Objective: Physical Exam Not obtained as encounter was done via telephone.   Previous notes and tests were  reviewed.  I discussed the assessment and treatment plan with the patient. The patient was provided an opportunity to ask questions and all were answered. The patient agreed with the plan and demonstrated an understanding of the instructions.   The patient was advised to call back or seek an in-person evaluation if the symptoms worsen or if the condition fails to improve as anticipated.  I provided 29 minutes of non-face-to-face time during this encounter.  It was my pleasure to participate in Laura Fields's care today. Please feel free to contact me with any questions or concerns.   Sincerely,  Thermon Leyland, FNP

## 2022-05-27 ENCOUNTER — Ambulatory Visit (INDEPENDENT_AMBULATORY_CARE_PROVIDER_SITE_OTHER): Payer: Managed Care, Other (non HMO) | Admitting: *Deleted

## 2022-05-27 DIAGNOSIS — J309 Allergic rhinitis, unspecified: Secondary | ICD-10-CM | POA: Diagnosis not present

## 2022-05-27 NOTE — Telephone Encounter (Signed)
Can we please do a PA? Flovent is a single agent inhaler and completely out of the question as it is inappropriate. If not approved with PA can you please print out patient assistance for [employer]- AZ&ME? Thank you

## 2022-05-31 ENCOUNTER — Ambulatory Visit (INDEPENDENT_AMBULATORY_CARE_PROVIDER_SITE_OTHER): Payer: Managed Care, Other (non HMO)

## 2022-05-31 DIAGNOSIS — J309 Allergic rhinitis, unspecified: Secondary | ICD-10-CM | POA: Diagnosis not present

## 2022-06-02 ENCOUNTER — Other Ambulatory Visit: Payer: Self-pay | Admitting: Podiatry

## 2022-06-02 MED ORDER — OXYCODONE-ACETAMINOPHEN 10-325 MG PO TABS: 1.0000 | ORAL_TABLET | Freq: Three times a day (TID) | ORAL | 0 refills | Status: DC | PRN

## 2022-06-06 ENCOUNTER — Telehealth: Payer: Self-pay

## 2022-06-06 NOTE — Telephone Encounter (Signed)
Can you please ask this patient which UC she went to and get a release of information form going? I can not see this visit in our EMR. Thank you. Please have her call us if she needs [place] for anything or has any questions. Thank you

## 2022-06-06 NOTE — Telephone Encounter (Signed)
Pt called back and will have uc send [place] over the notes from visit

## 2022-06-06 NOTE — Telephone Encounter (Signed)
Tried to call pt but was hung up on will try again later

## 2022-06-06 NOTE — Telephone Encounter (Signed)
Pt had a asthma flare and had to go to urgent care for a breathing treatment. Pt is feeling better today but still horse. She just wanted to let us know.

## 2022-06-07 ENCOUNTER — Encounter: Payer: Self-pay | Admitting: Physician Assistant

## 2022-06-07 ENCOUNTER — Other Ambulatory Visit: Payer: Self-pay | Admitting: Physician Assistant

## 2022-06-07 ENCOUNTER — Ambulatory Visit: Payer: Self-pay

## 2022-06-07 ENCOUNTER — Ambulatory Visit (INDEPENDENT_AMBULATORY_CARE_PROVIDER_SITE_OTHER): Payer: Commercial Managed Care - HMO

## 2022-06-07 ENCOUNTER — Ambulatory Visit (INDEPENDENT_AMBULATORY_CARE_PROVIDER_SITE_OTHER): Payer: Commercial Managed Care - HMO | Admitting: Physician Assistant

## 2022-06-07 DIAGNOSIS — M79605 Pain in left leg: Secondary | ICD-10-CM

## 2022-06-07 DIAGNOSIS — G8929 Other chronic pain: Secondary | ICD-10-CM | POA: Diagnosis not present

## 2022-06-07 DIAGNOSIS — M25561 Pain in right knee: Secondary | ICD-10-CM

## 2022-06-07 DIAGNOSIS — M25562 Pain in left knee: Secondary | ICD-10-CM | POA: Diagnosis not present

## 2022-06-07 DIAGNOSIS — M79661 Pain in right lower leg: Secondary | ICD-10-CM

## 2022-06-07 MED ORDER — OXYCODONE-ACETAMINOPHEN 10-325 MG PO TABS: 1.0000 | ORAL_TABLET | ORAL | 0 refills | Status: DC | PRN

## 2022-06-07 NOTE — Telephone Encounter (Signed)
Patient called this morning to see if we received the notes from [facility]. Told her we did not at that time. Patient is stating she is still really hoarse after her asthma flare up on Sunday. Advised her to do warm salt water gargles and warm honey water to soothe her throat. Please advise
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
## 2022-06-08 ENCOUNTER — Other Ambulatory Visit: Payer: Self-pay | Admitting: Family Medicine

## 2022-06-08 MED ORDER — BREZTRI AEROSPHERE 160-9-4.8 MCG/ACT IN AERO: 2.0000 | INHALATION_SPRAY | Freq: Two times a day (BID) | RESPIRATORY_TRACT | 5 refills | Status: AC

## 2022-06-08 NOTE — Telephone Encounter (Signed)
Attempted to do PA thru covermymeds Key: [id number] Got this message:  [employer] Rx Prior Authorization Department is unable to review this request at this time. Our records indicate that this member is not eligible for prior authorizations or that the review of prior authorizations is not delegated to [employer] Rx Prior Authorization Department. Please review the member's insurance demographics and submit to the appropriate party. Thank you in advance.  Resent prescription with coupon card information. Sending same card to patient mail.

## 2022-06-08 NOTE — Addendum Note (Signed)
Addended by: Rolland Bimler D on: 06/08/2022 05:34 PM   Modules accepted: Orders

## 2022-06-08 NOTE — Telephone Encounter (Signed)
I have the notes. Thank you

## 2022-06-09 NOTE — Telephone Encounter (Signed)
Called the pharmacy to see the status of the Breztri refill. Pharmacist said that without the approval of the insurance and with the coupon the medication would cost $500. The pharmacist says that these are the alternative that the insurance will cover. umeclidinium-vilanterol (ANORO ELLIPTA) 62.5-25 MCG/ACT AEPB Fluticasone Furoate (ARNUITY ELLIPTA) 100 MCG/ACT AEPB  PA did not go through for [employer].

## 2022-06-09 NOTE — Telephone Encounter (Signed)
What do we need to do to get this medication approved.

## 2022-06-09 NOTE — Telephone Encounter (Signed)
Thank you. I will put a sample of Breztri in the [facility] office for her while waiting for AZ&ME. Thank you for all your help!

## 2022-06-09 NOTE — Telephone Encounter (Signed)
The alternatives are not even appropriate. Can you please contact the insurance company instead of the pharmacy and let them know she needs a triple therapy inhaler? Thank you

## 2022-06-10 ENCOUNTER — Telehealth: Payer: Self-pay | Admitting: Physician Assistant

## 2022-06-10 DIAGNOSIS — G8929 Other chronic pain: Secondary | ICD-10-CM

## 2022-06-10 NOTE — Telephone Encounter (Signed)
Are you okay with MRI being ordered for the patient

## 2022-06-10 NOTE — Telephone Encounter (Signed)
Pt called requesting PA Chales Abrahams to send referral for MRI. Please call pt at [phone].

## 2022-06-13 ENCOUNTER — Telehealth: Payer: Self-pay | Admitting: Podiatry

## 2022-06-13 NOTE — Telephone Encounter (Signed)
Patient called and stated that she had a form was emailed from her [employer] a few weeks ago and wanted to know has it been sent. Patient stated the form is due 06/15/2022.

## 2022-06-13 NOTE — Telephone Encounter (Signed)
Contacted patient and she states that MRI is for her left knee. This has been ordered as requested

## 2022-06-14 ENCOUNTER — Ambulatory Visit (INDEPENDENT_AMBULATORY_CARE_PROVIDER_SITE_OTHER): Payer: Commercial Managed Care - HMO

## 2022-06-14 DIAGNOSIS — J309 Allergic rhinitis, unspecified: Secondary | ICD-10-CM | POA: Diagnosis not present

## 2022-06-14 NOTE — Telephone Encounter (Signed)
Faxed paperwork to [employer] on 06/13/2022 to Mrs. Laura Fields. I also faxed it to [email or web address]

## 2022-06-16 ENCOUNTER — Ambulatory Visit (INDEPENDENT_AMBULATORY_CARE_PROVIDER_SITE_OTHER): Payer: Commercial Managed Care - HMO

## 2022-06-16 ENCOUNTER — Telehealth: Payer: Self-pay

## 2022-06-16 DIAGNOSIS — J4550 Severe persistent asthma, uncomplicated: Secondary | ICD-10-CM | POA: Diagnosis not present

## 2022-06-16 NOTE — Telephone Encounter (Signed)
Faxed az and me paperwork to the program waiting on a response

## 2022-06-18 ENCOUNTER — Other Ambulatory Visit: Payer: Commercial Managed Care - HMO

## 2022-06-20 ENCOUNTER — Ambulatory Visit (INDEPENDENT_AMBULATORY_CARE_PROVIDER_SITE_OTHER): Payer: Commercial Managed Care - HMO

## 2022-06-20 DIAGNOSIS — J309 Allergic rhinitis, unspecified: Secondary | ICD-10-CM

## 2022-06-21 ENCOUNTER — Encounter: Payer: Self-pay | Admitting: Physician Assistant

## 2022-06-21 ENCOUNTER — Ambulatory Visit (INDEPENDENT_AMBULATORY_CARE_PROVIDER_SITE_OTHER): Payer: Commercial Managed Care - HMO | Admitting: Physician Assistant

## 2022-06-21 DIAGNOSIS — M25562 Pain in left knee: Secondary | ICD-10-CM

## 2022-06-21 MED ORDER — OXYCODONE-ACETAMINOPHEN 5-325 MG PO TABS: 1.0000 | ORAL_TABLET | Freq: Three times a day (TID) | ORAL | 0 refills | Status: DC | PRN

## 2022-06-21 NOTE — Progress Notes (Signed)
Office Visit Note   Patient: Laura Fields           Date of Birth: 08-23-1961           MRN: [id number] Visit Date: 06/21/2022              Requested by: Olive Bass, FNP [address] [place],  [place] [address] PCP: Olive Bass, FNP  Chief Complaint  Patient presents with   Left Knee - Follow-up      HPI: Patient follows up for her left knee pain today.  She is 2 weeks status falling onto her knee during a bar incident.  She had severe pain at her last visit.  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  She comes in today wearing a brace saying that the swelling is down but that she still has severe pain.  X-rays taken at her last visit demonstrate findings consistent with a previous tibial tubercle transfer but no acute injuries.  She did call requesting an MRI this has been ordered and is under nurse review  Assessment & Plan: Visit Diagnoses: Left knee pain  Plan: Patient is exam is very difficult as she hurts with every palpation and maneuver.  This is probably secondary to her CRPS.  Cannot isolate the worst pain.  We will get the MRI continue conservative treatment.  She is requesting more pain medicine.  I told her I cannot give her 10 mg Percocets I can only do 5 mg she is happy with this.  Follow-up with Dr. Cleophas Dunker once it is complete  Follow-Up Instructions: No follow-ups on file.   Ortho Exam  Patient is alert, oriented, no adenopathy, well-dressed, normal affect, normal respiratory effort. Examination of her left knee she has no effusion no redness compartments of the calf are soft and nontender.  No warmth.  She is acutely tender to light palpation over the medial lateral joint line.  She does not tolerate me doing an anterior draw for testing for varus and valgus stability.  Imaging: No results found. No images are attached to the encounter.  Labs: No results found for: "HGBA1C", "ESRSEDRATE", "CRP", "LABURIC", "REPTSTATUS",  "GRAMSTAIN", "CULT", "LABORGA"   Lab Results  Component Value Date   ALBUMIN 3.9 06/11/2021    No results found for: "MG" No results found for: "VD25OH"  No results found for: "PREALBUMIN"    Latest Ref Rng & Units 06/11/2021    1:44 PM 09/01/2020    4:19 PM  CBC EXTENDED  WBC 4.0 - 10.5 K/uL 8.4  6.2   RBC 3.87 - 5.11 MIL/uL 4.31  4.24   Hemoglobin 12.0 - 15.0 g/dL 35.7  01.7   HCT 79.3 - 46.0 % 40.9  38.5   Platelets 150 - 400 K/uL 408  446   NEUT# 1.7 - 7.7 K/uL 7.5  3.8   Lymph# 0.7 - 4.0 K/uL 0.7  1.8      There is no height or weight on file to calculate BMI.  Orders:  No orders of the defined types were placed in this encounter.  Meds ordered this encounter  Medications   oxyCODONE-acetaminophen (PERCOCET/ROXICET) 5-325 MG tablet    Sig: Take 1 tablet by mouth every 8 (eight) hours as needed for severe pain.    Dispense:  30 tablet    Refill:  0     Procedures: No procedures performed  Clinical Data: No additional findings.  ROS:  All other systems negative, except  as noted in the HPI. Review of Systems  Objective: Vital Signs: There were no vitals taken for this visit.  Specialty Comments:  No specialty comments available.  PMFS History: Patient Active Problem List   Diagnosis Date Noted   Seasonal and perennial allergic rhinitis 05/26/2022   Nerve pain 03/25/2022   Chronic pain syndrome 03/25/2022   Complex regional pain syndrome type 1 of right lower extremity 03/25/2022   Severe persistent asthma without complication 12/29/2021   Seasonal allergic rhinitis due to pollen 12/29/2021   Hand cramps 11/26/2021   Left hand weakness 10/11/2021   Numbness and tingling in left hand 09/02/2021   Gastroesophageal reflux disease 07/28/2021   Other atopic dermatitis 12/01/2020   Pruritic rash 02/12/2020   Allergic conjunctivitis of both eyes 02/12/2020   Past Medical History:  Diagnosis Date   Allergy    Anxiety    Arthritis    Asthma     Depression    Fibromyalgia    Intrinsic atopic dermatitis 12/01/2020   Osteoporosis    Raynaud's disease    Sciatica     Family History  Problem Relation Age of Onset   Hypertension Mother    Stomach cancer Other    Rectal cancer Other    Esophageal cancer Other    Colon polyps Other    Colon cancer Other    Allergic rhinitis Neg Hx    Angioedema Neg Hx    Asthma Neg Hx    Eczema Neg Hx    Immunodeficiency Neg Hx    Urticaria Neg Hx     Past Surgical History:  Procedure Laterality Date   ABDOMINAL HYSTERECTOMY     ACHILLES TENDON REPAIR Right 07/02/2021   AUGMENTATION MAMMAPLASTY Bilateral 1999   Saline/ under muscle   back nerve     block   COLONOSCOPY  09/23/2013   [facility]   colonoscopy with endo  11/30/2021   CYST REMOVAL NECK Left 12/23/2021   GASTRIC BYPASS     KNEE ARTHROSCOPY     mass removable     on thumb   STOMACH SURGERY     Social History   Occupational History   Not on file  Tobacco Use   Smoking status: Never    Passive exposure: Yes   Smokeless tobacco: Never  Vaping Use   Vaping Use: Never used  Substance and Sexual Activity   Alcohol use: Yes    Comment: monthly or less   Drug use: Never   Sexual activity: Yes

## 2022-06-22 ENCOUNTER — Other Ambulatory Visit: Payer: Self-pay | Admitting: Physician Assistant

## 2022-06-22 ENCOUNTER — Telehealth: Payer: Self-pay | Admitting: Physician Assistant

## 2022-06-22 MED ORDER — OXYCODONE-ACETAMINOPHEN 5-325 MG PO TABS: 1.0000 | ORAL_TABLET | Freq: Three times a day (TID) | ORAL | 0 refills | Status: AC | PRN

## 2022-06-22 NOTE — Telephone Encounter (Signed)
Pt was called and advised.

## 2022-06-22 NOTE — Telephone Encounter (Signed)
Tried calling pharmacy is closed for lunch until 2

## 2022-06-22 NOTE — Telephone Encounter (Signed)
Called and confirm. Pharmacy is out. Please send to pt's requested pharmacy

## 2022-06-22 NOTE — Telephone Encounter (Signed)
Pt called and states that the pharm does not have any narcotics can we change script to [facility] on [address]   Cb [phone]

## 2022-06-24 NOTE — Telephone Encounter (Signed)
Received [employer] missing information notification fax: patient's insurance information was not completed/missing from application.  Faxed patient's insurance information to [employer].

## 2022-06-28 ENCOUNTER — Telehealth: Payer: Self-pay

## 2022-06-28 ENCOUNTER — Telehealth: Payer: Self-pay | Admitting: Family Medicine

## 2022-06-28 ENCOUNTER — Telehealth: Payer: Self-pay | Admitting: Physician Assistant

## 2022-06-28 NOTE — Telephone Encounter (Signed)
Patient called and said that every times she goes out of town she comes back with a cough with mucus and asthma bad, and can not talk [facility]// [phone]

## 2022-06-28 NOTE — Telephone Encounter (Signed)
Thank you very much 

## 2022-06-28 NOTE — Telephone Encounter (Signed)
Pt went out of town 2 weeks ago and had a asthma flare. She went out of town this weekend for the holiday and now has congestion and voice change and coughing up thick phlegm. She is doing all meds antihistamine and nasal spray. No fever and no bady aches. Stuffy between eyes and her head its so much pressure its causing a headache.

## 2022-06-28 NOTE — Telephone Encounter (Signed)
Pt is scheduled wth Dr Marlynn Perking tomorrow at [facility]

## 2022-06-28 NOTE — Telephone Encounter (Signed)
Can you please have someone see her if there are any open appointments? Thank you

## 2022-06-28 NOTE — Telephone Encounter (Signed)
Patient called asked if she can get a call back concerning her MRI. Patient said it was being reviewed by a nurse. The number to contact patient is [phone]

## 2022-06-29 ENCOUNTER — Encounter: Payer: Self-pay | Admitting: Internal Medicine

## 2022-06-29 ENCOUNTER — Ambulatory Visit: Payer: Commercial Managed Care - HMO | Admitting: Internal Medicine

## 2022-06-29 VITALS — HR 100 | Temp 97.9°F | Resp 19

## 2022-06-29 DIAGNOSIS — J4541 Moderate persistent asthma with (acute) exacerbation: Secondary | ICD-10-CM | POA: Diagnosis not present

## 2022-06-29 MED ORDER — METHYLPREDNISOLONE ACETATE 40 MG/ML IJ SUSP
40.0000 mg | Freq: Once | INTRAMUSCULAR | Status: AC
  Administered 2022-06-29: 40 mg via INTRAMUSCULAR

## 2022-06-29 MED ORDER — AMOXICILLIN-POT CLAVULANATE 875-125 MG PO TABS: 1.0000 | ORAL_TABLET | Freq: Two times a day (BID) | ORAL | 0 refills | Status: AC

## 2022-06-29 MED ORDER — PREDNISONE 10 MG PO TABS: ORAL_TABLET | ORAL | 0 refills | Status: AC

## 2022-06-29 MED ORDER — BENZONATATE 100 MG PO CAPS: 100.0000 mg | ORAL_CAPSULE | Freq: Three times a day (TID) | ORAL | 0 refills | Status: AC | PRN

## 2022-06-29 NOTE — Telephone Encounter (Signed)
Spoke to the patient and she is aware that she will need to contact her orthopedic Dr. [name] for an increase in her medication. The patient was upset and hung the phone up on me.

## 2022-06-29 NOTE — Patient Instructions (Addendum)
Asthma: not well controlled with acute sinus infection  Medrol 40mg  IM given today in clinic  Starting tomorrow: Take prednisone 40mg  daily x 2 days, 30mg  daily x 2 days, 20mg  daily x 2 days and 10mg  daily x 2 days. Starting today: Augmentin 875/125mg  1 tab twice daily for 14 days  If not stable in 24 hours consider going to ED   Continue chronic management:  Continue Breztri 2 puffs twice a day with a spacer to prevent cough or wheeze Continue montelukast 10 mg once a day to prevent cough or wheeze Continue albuterol 2 puffs every 4 hours as needed for cough or wheeze OR Instead use albuterol 0.083% solution via nebulizer one unit vial every 4 hours as needed for cough or wheeze Continue Tezspire injections for asthma control  Allergic rhinitis Start Benzoate pearls three times a day as needed for cough  Continue avoidance measures directed toward grass pollen and weed pollen as listed below Continue allergen immunotherapy per protocol and have access to an epinephrine autoinjector set  (hold off on injection until you fully recover from acute illness)  Continue Xyzal 5 mg once a day as needed for a runny nose or itch. This will replace carbinoxamine.    Continue azelastine 2 sprays in each nostril twice a day as needed for a runny nose Continue flunisolide- 2 sprays in each nostril 2 to 3 times a day.  Consider saline nasal rinses as needed for nasal symptoms. Use this before any medicated nasal sprays for best result  Allergic conjunctivitis Continue epinastine eye drops 1 drop in each eye twice a day as needed for red, itchy eyes.  Reflux Continue dietary lifestyle modifications as listed below Continue to follow up with your gastroenterologist for evaluation and treatment of GI issues  Follow up when you return from   Thank you so much for letting me partake in your care today.  Don't hesitate to reach out if you have any additional concerns!  , MD   [facility] [place] [place]

## 2022-06-29 NOTE — Progress Notes (Signed)
Follow Up Note  RE: Laura Fields MRN: [id number] DOB: 09/21/1961 Date of Office Visit: 06/29/2022  Referring provider: Marrian Salvage,* Primary care provider: Marrian Salvage, FNP  Chief Complaint: No chief complaint on file.  History of Present Illness: I had the pleasure of seeing Laura Fields for a follow up visit at the [facility] [place] [facility] [place] on 07/01/2022. She is a 61 y.o. female, who is being followed for persistent asthma . Her previous allergy office visit was on 05/26/22 with Gareth Morgan, [place]. Today is a  sick visit  .  History obtained from patient, chart review.  Since last visit she reports that she has gone out of town twice now and developed URI.  First one resolved spontaneously without treatment, however since returning on from most recently road trip to [place] she has had increased cough, dyspnea, chest tightness. She has been using her albuterol nebs and reports compliance with breztri, montelukast and tezspire.  She is also on narcortics for chronic foot and knee pain.  She is upset during encounter due to running out of pain meds and along with asthma she is having pain flares.  She is leaving for a trip to [place] on Friday.    Assessment and Plan: [name] is a 61 y.o. female with: Moderate persistent asthma with (acute) exacerbation Plan: Patient Instructions  Asthma: not well controlled with acute sinus infection  Medrol 40mg  IM given today in clinic  Starting tomorrow: Take prednisone 40mg  daily x 2 days, 30mg  daily x 2 days, 20mg  daily x 2 days and 10mg  daily x 2 days. Starting today: Augmentin 875/125mg  1 tab twice daily for 14 days  If not stable in 24 hours consider going to ED   Continue chronic management:  Continue Breztri 2 puffs twice a day with a spacer to prevent cough or wheeze Continue montelukast 10 mg once a day to prevent cough or wheeze Continue albuterol 2 puffs every 4 hours as needed for cough or wheeze OR Instead use albuterol  0.083% solution via nebulizer one unit vial every 4 hours as needed for cough or wheeze Continue Tezspire injections for asthma control  Allergic rhinitis Start Benzoate pearls three times a day as needed for cough  Continue avoidance measures directed toward grass pollen and weed pollen as listed below Continue allergen immunotherapy per protocol and have access to an epinephrine autoinjector set  (hold off on injection until you fully recover from acute illness)  Continue Xyzal 5 mg once a day as needed for a runny nose or itch. This will replace carbinoxamine.    Continue azelastine 2 sprays in each nostril twice a day as needed for a runny nose Continue flunisolide- 2 sprays in each nostril 2 to 3 times a day.  Consider saline nasal rinses as needed for nasal symptoms. Use this before any medicated nasal sprays for best result  Allergic conjunctivitis Continue epinastine eye drops 1 drop in each eye twice a day as needed for red, itchy eyes.  Reflux Continue dietary lifestyle modifications as listed below Continue to follow up with your gastroenterologist for evaluation and treatment of GI issues  Follow up when you return from [place]   Thank you so much for letting me partake in your care today.  Don't hesitate to reach out if you have any additional concerns!  Roney Marion, MD  [facility] [place] [place]    No follow-ups on file.  Meds ordered this encounter  Medications  predniSONE (DELTASONE) 10 MG tablet    Sig: Take 40mg  daily x 2 days, 30mg  daily x 2 days, 20mg  daily x 2 days and 10mg  daily x 2 days.    Dispense:  20 tablet    Refill:  0   amoxicillin-clavulanate (AUGMENTIN) 875-125 MG tablet    Sig: Take 1 tablet by mouth 2 (two) times daily for 10 days.    Dispense:  20 tablet    Refill:  0   benzonatate (TESSALON PERLES) 100 MG capsule    Sig: Take 1 capsule (100 mg total) by mouth 3 (three) times daily as needed for up to 20 days for cough.     Dispense:  20 capsule    Refill:  0   methylPREDNISolone acetate (DEPO-MEDROL) injection 40 mg    Lab Orders  No laboratory test(s) ordered today   Diagnostics: None performed    Medication List:  Current Outpatient Medications  Medication Sig Dispense Refill   albuterol (PROVENTIL) (2.5 MG/3ML) 0.083% nebulizer solution Take 3 mLs (2.5 mg total) by nebulization every 4 (four) hours as needed for wheezing or shortness of breath (coughing fits). 75 mL 1   albuterol (VENTOLIN HFA) 108 (90 Base) MCG/ACT inhaler 2 puff into the lungs every 4 hrs as needed for wheeze or shortness of breath. 18 each 1   AMBULATORY NON FORMULARY MEDICATION Apply 1 application topically at bedtime. Medication Name: Compounded Hydroquinone 8%, Tretinoin 0.025%, Kojic Acid 1%, Niacinamide 4%, Fluocinolone 0.025% Cream ([employer]) 30 g 0   amoxicillin-clavulanate (AUGMENTIN) 875-125 MG tablet Take 1 tablet by mouth 2 (two) times daily for 10 days. 20 tablet 0   azelastine (ASTELIN) 0.1 % nasal spray Place 2 sprays into both nostrils 2 (two) times daily as needed for rhinitis. 30 mL 5   baclofen (LIORESAL) 10 MG tablet Take 10 mg by mouth 3 (three) times daily.     benzonatate (TESSALON PERLES) 100 MG capsule Take 1 capsule (100 mg total) by mouth 3 (three) times daily as needed for up to 20 days for cough. 20 capsule 0   Budeson-Glycopyrrol-Formoterol (BREZTRI AEROSPHERE) 160-9-4.8 MCG/ACT AERO Inhale 2 puffs into the lungs in the morning and at bedtime. 10.7 g 5   budesonide (PULMICORT) 0.5 MG/2ML nebulizer solution Take 2 mLs (0.5 mg total) by nebulization in the morning and at bedtime. For the next 2 weeks and use during asthma flares. 120 mL 2   cyclobenzaprine (FLEXERIL) 10 MG tablet Take 1 tablet (10 mg total) by mouth at bedtime as needed for muscle spasms. Can take at night AS NEEDED FOR muscle spasms 30 tablet 5   cyclobenzaprine (FLEXERIL) 5 MG tablet Take 5-10 mg by mouth at bedtime.     desonide  (DESOWEN) 0.05 % ointment APPLY 1 APPLICATION TOPICALLY 2 TIMES DAILY AS NEEDED TO RED ITCHY AREAS TO THE FACE. 45 g 0   DULoxetine (CYMBALTA) 60 MG capsule Take 1 capsule (60 mg total) by mouth daily. 90 capsule 1   Epinastine HCl 0.05 % ophthalmic solution Apply to eye as needed.     EPINEPHrine (EPIPEN 2-PAK) 0.3 mg/0.3 mL IJ SOAJ injection Inject 0.3 mg into the muscle as needed for anaphylaxis. 1 each 1   EPINEPHrine 0.3 mg/0.3 mL IJ SOAJ injection Use as directed as severe allergic reactions 2 each 1   famotidine (PEPCID) 20 MG tablet TAKE 1 TABLET BY MOUTH TWICE A DAY 180 tablet 0   flunisolide (NASALIDE) 25 MCG/ACT (0.025%) SOLN 2 SPRAYS PER NOSTRIL 2-3  TIMES PER DAY AS NEEDED FOR STUFFY NOSE. 75 mL 0   gabapentin (NEURONTIN) 300 MG capsule Take 1 capsule (300 mg total) by mouth 3 (three) times daily. 180 capsule 3   hydrOXYzine (ATARAX/VISTARIL) 25 MG tablet Take 1 tablet (25 mg total) by mouth 3 (three) times daily as needed for itching. 60 tablet 0   levocetirizine (XYZAL) 5 MG tablet Take 1 tablet (5 mg total) by mouth every evening. 34 tablet 5   meloxicam (MOBIC) 15 MG tablet Take 15 mg by mouth daily.     montelukast (SINGULAIR) 10 MG tablet TAKE 1 TABLET BY MOUTH EVERYDAY AT BEDTIME 90 tablet 1   ondansetron (ZOFRAN) 4 MG tablet Take 1 tablet (4 mg total) by mouth every 8 (eight) hours as needed. 20 tablet 0   oxyCODONE-acetaminophen (PERCOCET/ROXICET) 5-325 MG tablet Take 1 tablet by mouth every 8 (eight) hours as needed for severe pain. 20 tablet 0   pantoprazole (PROTONIX) 40 MG tablet TAKE 1 TABLET BY MOUTH EVERY DAY 90 tablet 2   phenazopyridine (PYRIDIUM) 100 MG tablet Take 1 tablet (100 mg total) by mouth 3 (three) times daily as needed for pain. 6 tablet 0   polyethylene glycol powder (GLYCOLAX/MIRALAX) 17 GM/SCOOP powder Take 17g daily 255 g 3   predniSONE (DELTASONE) 10 MG tablet Take 40mg  daily x 2 days, 30mg  daily x 2 days, 20mg  daily x 2 days and 10mg  daily x 2 days. 20  tablet 0   pregabalin (LYRICA) 150 MG capsule Take 150 mg by mouth 2 (two) times daily.     pregabalin (LYRICA) 150 MG capsule Take 1 capsule (150 mg total) by mouth at bedtime. X 4 days then 150 mg BID x 4 days then TID- for nerve pain/CRPS 90 capsule 5   tiZANidine (ZANAFLEX) 4 MG tablet Take 0.5-1 tablets (2-4 mg total) by mouth 2 (two) times daily. For muscle spasms- use scheduled 60 tablet 5   traZODone (DESYREL) 50 MG tablet TAKE 1-2 TABLETS BY MOUTH AT BEDTIME. 180 tablet 2   triamcinolone ointment (KENALOG) 0.1 % Apply 1 Application topically 2 (two) times daily. 30 g 0   Current Facility-Administered Medications  Medication Dose Route Frequency Provider Last Rate Last Admin   tezepelumab-ekko (TEZSPIRE) 210 MG/1. syringe 210 mg  210 mg Subcutaneous Q28 days , DO   210 mg at 06/16/22 1049   Allergies: Allergies  Allergen Reactions   Rocephin [Ceftriaxone Sodium In Dextrose] Hives   I reviewed her past medical history, social history, family history, and environmental history and no significant changes have been reported from her previous visit.  ROS: All others negative except as noted per HPI.   Objective: Pulse 100   Temp 97.9 F (36.6 C) (Temporal)   Resp 19   SpO2 100%  There is no height or weight on file to calculate BMI. General Appearance:  Alert, cooperative, tearful during encounter, rocking back and forth, appears stated age  Head:  Normocephalic, without obvious abnormality, atraumatic  Eyes:  Conjunctiva clear, EOM's intact  Nose: Nares normal,   Throat: Lips, tongue normal; teeth and gums normal,   Neck: Supple, symmetrical  Lungs:   end-expiratory wheezing, Respirations unlabored, intermittent dry coughing  Heart:  regular rate and rhythm and no murmur, Appears well perfused  Extremities: No edema  Skin: Skin color, texture, turgor normal, no rashes or lesions on visualized portions of skin   Neurologic: No gross deficits   Previous notes  and tests were reviewed. The  plan was reviewed with the patient/family, and all questions/concerned were addressed.  It was my pleasure to see Krysti today and participate in her care. Please feel free to contact me with any questions or concerns.  Sincerely,  Ferol Luz, MD  Allergy & Immunology  [facility] Office: [phone]

## 2022-06-29 NOTE — Telephone Encounter (Signed)
I called the patient. Laura Fields has denied her case. She said she would like to call [employer], herself, to hopefully get them to overturn their decision -- offered her the number we have for [employer], but she said she would just call the number on the back of her card.

## 2022-07-13 ENCOUNTER — Ambulatory Visit (INDEPENDENT_AMBULATORY_CARE_PROVIDER_SITE_OTHER): Payer: Commercial Managed Care - HMO

## 2022-07-13 DIAGNOSIS — J309 Allergic rhinitis, unspecified: Secondary | ICD-10-CM | POA: Diagnosis not present

## 2022-07-14 ENCOUNTER — Ambulatory Visit (INDEPENDENT_AMBULATORY_CARE_PROVIDER_SITE_OTHER): Payer: Commercial Managed Care - HMO

## 2022-07-14 ENCOUNTER — Telehealth: Payer: Self-pay

## 2022-07-14 DIAGNOSIS — J4550 Severe persistent asthma, uncomplicated: Secondary | ICD-10-CM

## 2022-07-14 NOTE — Telephone Encounter (Signed)
Laura Fields stopped by the office yesterday. She had to move her appointment on 07/20/2022 to the first available opening. Which is on 10/03/2022 with Dr. Dagoberto Ligas. Because she is having a back procedure on 07/20/2022. Laura Fields will run out of her prescribe medications  by Dr. Dagoberto Ligas on or before 07/20/2022.   If possible please send in "All" prescribed medications in for refill.   The refill rules where reviewed with the Laura Fields twice. Patient wants all her medications refills.   Thank you  Call back phone [phone].  (Dr. Dagoberto Ligas is out of the office today 9/21/223).

## 2022-07-19 ENCOUNTER — Telehealth: Payer: Self-pay | Admitting: *Deleted

## 2022-07-19 NOTE — Telephone Encounter (Signed)
Patient is requesting that she be sent to another facility,has seen Dr Davy Pique on Wednesday,is not a good fit for her, was having a problem with them dispensing her pain medicine. Please advise.

## 2022-07-20 ENCOUNTER — Encounter: Payer: Commercial Managed Care - HMO | Admitting: Physical Medicine and Rehabilitation

## 2022-07-21 NOTE — Telephone Encounter (Signed)
Spoke with patient and she is wanting the referral sent to [facility]/ pain management Specialist. She has called and was given their fax #[phone],phone#: [phone]

## 2022-07-25 NOTE — Telephone Encounter (Signed)
Faxing referral to [facility] management, confirmation received-07/25/22. Patient has been notified and given their #.

## 2022-07-26 ENCOUNTER — Other Ambulatory Visit: Payer: Self-pay | Admitting: Family Medicine

## 2022-07-27 ENCOUNTER — Encounter: Payer: Self-pay | Admitting: *Deleted

## 2022-07-27 NOTE — Telephone Encounter (Signed)
Representative with [facility] called to let us know that they do not accept the patient insurance - Nurse, mental health.   Please advise.

## 2022-07-27 NOTE — Addendum Note (Signed)
Addended by: Courtney Heys on: 07/27/2022 02:15 PM   Modules accepted: Orders

## 2022-07-28 ENCOUNTER — Telehealth: Payer: Self-pay | Admitting: *Deleted

## 2022-07-28 ENCOUNTER — Other Ambulatory Visit: Payer: Self-pay | Admitting: *Deleted

## 2022-07-28 NOTE — Addendum Note (Signed)
Addended byDeidre Ala, Kazuko Clemence L on: 07/28/2022 02:42 PM   Modules accepted: Orders

## 2022-07-28 NOTE — Telephone Encounter (Signed)
Patient is calling to ask which doctor would be better to see: Dr Suella Broad or Dr Nicholaus Bloom for pain and spine management. Please advise.

## 2022-07-28 NOTE — Telephone Encounter (Signed)
Patient requesting Dr Nelva Bush

## 2022-07-28 NOTE — Telephone Encounter (Signed)
Referral has been sent to [facility], Dr Suella Broad, per patient's request.

## 2022-07-29 NOTE — Telephone Encounter (Signed)
Referral sent, patient aware  

## 2022-08-02 ENCOUNTER — Ambulatory Visit (INDEPENDENT_AMBULATORY_CARE_PROVIDER_SITE_OTHER): Payer: Commercial Managed Care - HMO

## 2022-08-02 DIAGNOSIS — J309 Allergic rhinitis, unspecified: Secondary | ICD-10-CM | POA: Diagnosis not present

## 2022-08-11 ENCOUNTER — Ambulatory Visit (INDEPENDENT_AMBULATORY_CARE_PROVIDER_SITE_OTHER): Payer: Commercial Managed Care - HMO

## 2022-08-11 ENCOUNTER — Telehealth: Payer: Self-pay | Admitting: *Deleted

## 2022-08-11 DIAGNOSIS — J4550 Severe persistent asthma, uncomplicated: Secondary | ICD-10-CM

## 2022-08-11 NOTE — Telephone Encounter (Signed)
Patient is calling because she is unable to see Dr Nelva Bush, he denied,no reason specified per their office but was  referred to Dr Cyril Loosen their office for update, no answer, left voice message for call back. Patient said that she is still having foot and ankle pain.

## 2022-08-15 ENCOUNTER — Ambulatory Visit (INDEPENDENT_AMBULATORY_CARE_PROVIDER_SITE_OTHER): Payer: Commercial Managed Care - HMO

## 2022-08-15 DIAGNOSIS — J309 Allergic rhinitis, unspecified: Secondary | ICD-10-CM | POA: Diagnosis not present

## 2022-08-16 ENCOUNTER — Telehealth: Payer: Self-pay | Admitting: *Deleted

## 2022-08-16 NOTE — Telephone Encounter (Signed)
Patient is requesting a medical stating she needs assistance due to her conditions for social services,to help getting to and from appointments/meals. She is in process of changing her insurance and will contact with that information. Please advise.

## 2022-08-22 ENCOUNTER — Ambulatory Visit (INDEPENDENT_AMBULATORY_CARE_PROVIDER_SITE_OTHER): Payer: Commercial Managed Care - HMO

## 2022-08-22 DIAGNOSIS — J309 Allergic rhinitis, unspecified: Secondary | ICD-10-CM

## 2022-08-29 ENCOUNTER — Encounter
Payer: Commercial Managed Care - HMO | Attending: Physical Medicine and Rehabilitation | Admitting: Physical Medicine and Rehabilitation

## 2022-08-29 ENCOUNTER — Encounter: Payer: Self-pay | Admitting: Physical Medicine and Rehabilitation

## 2022-08-29 VITALS — BP 123/78 | HR 69 | Ht 64.0 in | Wt 171.6 lb

## 2022-08-29 DIAGNOSIS — F4310 Post-traumatic stress disorder, unspecified: Secondary | ICD-10-CM

## 2022-08-29 DIAGNOSIS — R202 Paresthesia of skin: Secondary | ICD-10-CM | POA: Diagnosis present

## 2022-08-29 DIAGNOSIS — M792 Neuralgia and neuritis, unspecified: Secondary | ICD-10-CM | POA: Diagnosis not present

## 2022-08-29 DIAGNOSIS — G894 Chronic pain syndrome: Secondary | ICD-10-CM | POA: Diagnosis present

## 2022-08-29 DIAGNOSIS — R200 Anesthesia of skin: Secondary | ICD-10-CM

## 2022-08-29 DIAGNOSIS — F5104 Psychophysiologic insomnia: Secondary | ICD-10-CM

## 2022-08-29 DIAGNOSIS — G90521 Complex regional pain syndrome I of right lower limb: Secondary | ICD-10-CM | POA: Diagnosis not present

## 2022-08-29 MED ORDER — TRAZODONE HCL 150 MG PO TABS: 150.0000 mg | ORAL_TABLET | Freq: Every day | ORAL | 5 refills | Status: DC

## 2022-08-29 MED ORDER — GABAPENTIN 300 MG PO CAPS: 300.0000 mg | ORAL_CAPSULE | Freq: Three times a day (TID) | ORAL | 5 refills | Status: DC

## 2022-08-29 MED ORDER — PRAZOSIN HCL 2 MG PO CAPS
2.0000 mg | ORAL_CAPSULE | Freq: Every day | ORAL | 5 refills | Status: DC
Start: 2022-08-29 — End: 2022-10-11

## 2022-08-29 MED ORDER — TIZANIDINE HCL 4 MG PO TABS: 2.0000 mg | ORAL_TABLET | Freq: Two times a day (BID) | ORAL | 5 refills | Status: AC
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
## 2022-08-29 NOTE — Patient Instructions (Signed)
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 And new L knee pain and swelling. Poor sleep.  Here for f/u on chronic pain. And new PTSD  Add prazosin 2 mg - for PTSD symptoms 2 mg nightly- #30 and 5 refills. Has a lot of nightmares and startle reflexes now.   2. 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   3. Increase gabapentin- 300 mg 3x/day x 1 week, then 600 mg 2x/day x 1 week, then can increase to 600 mg 3x/day- for nerve pain.   4. Make changes of meds with 1 day separating them- so M/W/F and should be able to make all the changes this week.   5. Will con't Tizanidine 2-4 mg up to 2x/day- for muscle spasms.   6. Wrote letter for pt- to see if can get her help at home for food, dirving to appts and housekeeping.    7. F/U in 3 months.

## 2022-08-30 ENCOUNTER — Ambulatory Visit (INDEPENDENT_AMBULATORY_CARE_PROVIDER_SITE_OTHER): Payer: Commercial Managed Care - HMO

## 2022-08-30 DIAGNOSIS — J309 Allergic rhinitis, unspecified: Secondary | ICD-10-CM | POA: Diagnosis not present

## 2022-09-01 ENCOUNTER — Telehealth: Payer: Self-pay

## 2022-09-02 ENCOUNTER — Telehealth: Payer: Self-pay

## 2022-09-02 NOTE — Telephone Encounter (Signed)
Patient stated that she needed the letter to state she needed the letter to include that she needed additional living space for her medical equipment so that she can store her medical equipment that she has her walker, scooter, etc. Per Dr. Berline Chough she will not be adding additional notes to letter due to her knowledge of the patient only having a walker and not any additional medical equipment.

## 2022-09-02 NOTE — Telephone Encounter (Signed)
Encounter created in error

## 2022-09-08 ENCOUNTER — Ambulatory Visit (INDEPENDENT_AMBULATORY_CARE_PROVIDER_SITE_OTHER): Payer: Commercial Managed Care - HMO

## 2022-09-08 DIAGNOSIS — J4550 Severe persistent asthma, uncomplicated: Secondary | ICD-10-CM | POA: Diagnosis not present

## 2022-09-14 ENCOUNTER — Encounter: Payer: Self-pay | Admitting: Internal Medicine

## 2022-09-14 ENCOUNTER — Ambulatory Visit (INDEPENDENT_AMBULATORY_CARE_PROVIDER_SITE_OTHER): Payer: Commercial Managed Care - HMO | Admitting: Internal Medicine

## 2022-09-14 VITALS — BP 134/84 | HR 126 | Temp 98.1°F | Resp 20

## 2022-09-14 DIAGNOSIS — J4551 Severe persistent asthma with (acute) exacerbation: Secondary | ICD-10-CM

## 2022-09-14 DIAGNOSIS — B999 Unspecified infectious disease: Secondary | ICD-10-CM

## 2022-09-14 DIAGNOSIS — K219 Gastro-esophageal reflux disease without esophagitis: Secondary | ICD-10-CM | POA: Diagnosis not present

## 2022-09-14 DIAGNOSIS — J4550 Severe persistent asthma, uncomplicated: Secondary | ICD-10-CM | POA: Diagnosis not present

## 2022-09-14 DIAGNOSIS — J302 Other seasonal allergic rhinitis: Secondary | ICD-10-CM

## 2022-09-14 DIAGNOSIS — J3089 Other allergic rhinitis: Secondary | ICD-10-CM | POA: Diagnosis not present

## 2022-09-14 MED ORDER — METHYLPREDNISOLONE ACETATE 40 MG/ML IJ SUSP
40.0000 mg | Freq: Once | INTRAMUSCULAR | Status: AC
  Administered 2022-09-14: 40 mg via INTRAMUSCULAR

## 2022-09-14 MED ORDER — CARBINOXAMINE MALEATE 4 MG PO TABS
4.0000 mg | ORAL_TABLET | Freq: Three times a day (TID) | ORAL | 2 refills | Status: DC
Start: 2022-09-14 — End: 2022-12-19

## 2022-09-14 MED ORDER — DOXYCYCLINE MONOHYDRATE 100 MG PO TABS: 100.0000 mg | ORAL_TABLET | Freq: Two times a day (BID) | ORAL | 0 refills | Status: AC

## 2022-09-14 NOTE — Patient Instructions (Addendum)
Asthma: not well controlled with acute exacerbation  Medrol 40mg  IM given today in clinic Doxycycline 100mg  twice a day for 7 days  Starting tomorrow: Take prednisone 40mg  daily x 2 days, 30mg  daily x 2 days, 20mg  daily x 2 days and 10mg  daily x 2 days.   Continue chronic management:  Continue Breztri 2 puffs twice a day with a spacer to prevent cough or wheeze Continue montelukast 10 mg once a day to prevent cough or wheeze Continue albuterol 2 puffs every 4 hours as needed for cough or wheeze OR Instead use albuterol 0.083% solution via nebulizer one unit vial every 4 hours as needed for cough or wheeze Continue Tezspire injections for asthma control  Allergic rhinitis Start Carbinoxamine 4mg  up to 3 times a day for drainage (this replaces xyzal) Continue  Benzoate pearls three times a day as needed for cough  Continue avoidance measures directed toward grass pollen and weed pollen as listed below Continue allergen immunotherapy per protocol and have access to an epinephrine autoinjector set  (hold off on injection until you fully recover from acute illness)  Continue azelastine 2 sprays in each nostril twice a day as needed for a runny nose Continue flunisolide- 2 sprays in each nostril 2 to 3 times a day.  Consider saline nasal rinses as needed for nasal symptoms. Use this before any medicated nasal sprays for best result  Allergic conjunctivitis Continue epinastine eye drops 1 drop in each eye twice a day as needed for red, itchy eyes.  Reflux Continue dietary lifestyle modifications as listed below Continue to follow up with your gastroenterologist for evaluation and treatment of GI issues  Follow up: 2 weeks to discuss changes in your asthma management   Thank you so much for letting me partake in your care today.  Don't hesitate to reach out if you have any additional concerns!  , MD  [facility] [place], [place]

## 2022-09-14 NOTE — Progress Notes (Signed)
Follow Up Note  RE: Laura Fields MRN: [id number] DOB: Jan 02, 1961 Date of Office Visit: 09/14/2022  Referring provider: Marrian Salvage,* Primary care provider: Marrian Salvage, [place]  Chief Complaint: Asthma (Asthma flare up since last week. ) and Cough  History of Present Illness: I had the pleasure of seeing Laura Fields for a follow up visit at the [facility] on 09/14/2022. She is a 61 y.o. female, who is being followed for persistent asthma, allergic rhinitis on AIT. Her previous allergy office visit was on 06/29/22 with Dr. Edison Pace. Today is a  sick visit for asthma flare  .  History obtained from patient, chart review.  Patient presented today for allergy injections however she was noted to be coughing in the waiting room and she was brought back to be examined.  She reports 5 days of increased rhinorrhea, cough, dyspnea, malaise.  This is similar to her symptoms which she presented in September.  She did report that the prednisone, Depo-Medrol and Augmentin fully alleviated her symptoms in September.  She reports having episodes/exacerbations every 2 to 4 months.  She has been on Tezspire for a year and has not missed any injections.  She reports compliance with Breztri 2 puffs twice daily, montelukast 10 mg daily.  She has been using her albuterol multiple times a day.  Assessment and Plan: Laura Fields is a 61 y.o. female with: Severe persistent asthma with acute exacerbation  Seasonal and perennial allergic rhinitis  Recurrent infections  Gastroesophageal reflux disease without esophagitis Plan: Patient Instructions  Asthma: not well controlled with acute exacerbation  Medrol 40mg  IM given today in clinic Doxycycline 100mg  twice a day for 7 days  Starting tomorrow: Take prednisone 40mg  daily x 2 days, 30mg  daily x 2 days, 20mg  daily x 2 days and 10mg  daily x 2 days.   Continue chronic management:  Continue Breztri 2 puffs twice a day with a  spacer to prevent cough or wheeze Continue montelukast 10 mg once a day to prevent cough or wheeze Continue albuterol 2 puffs every 4 hours as needed for cough or wheeze OR Instead use albuterol 0.083% solution via nebulizer one unit vial every 4 hours as needed for cough or wheeze Continue Tezspire injections for asthma control  Allergic rhinitis Start Carbinoxamine 4mg  up to 3 times a day for drainage (this replaces xyzal) Continue  Benzoate pearls three times a day as needed for cough  Continue avoidance measures directed toward grass pollen and weed pollen as listed below Continue allergen immunotherapy per protocol and have access to an epinephrine autoinjector set  (hold off on injection until you fully recover from acute illness)  Continue azelastine 2 sprays in each nostril twice a day as needed for a runny nose Continue flunisolide- 2 sprays in each nostril 2 to 3 times a day.  Consider saline nasal rinses as needed for nasal symptoms. Use this before any medicated nasal sprays for best result  Allergic conjunctivitis Continue epinastine eye drops 1 drop in each eye twice a day as needed for red, itchy eyes.  Reflux Continue dietary lifestyle modifications as listed below Continue to follow up with your gastroenterologist for evaluation and treatment of GI issues  Follow up: 2 weeks to discuss changes in your asthma management   Thank you so much for letting me partake in your care today.  Don't hesitate to reach out if you have any additional concerns!  Roney Marion, MD  [facility] [place],  [place]   No follow-ups on file.  Meds ordered this encounter  Medications   Carbinoxamine Maleate 4 MG TABS    Sig: Take 1 tablet (4 mg total) by mouth in the morning, at noon, and at bedtime.    Dispense:  90 tablet    Refill:  2   doxycycline (ADOXA) 100 MG tablet    Sig: Take 1 tablet (100 mg total) by mouth 2 (two) times daily for 7 days.    Dispense:  14  tablet    Refill:  0   methylPREDNISolone acetate (DEPO-MEDROL) injection 40 mg    Lab Orders  No laboratory test(s) ordered today   Diagnostics: None done  Medication List:  Current Outpatient Medications  Medication Sig Dispense Refill   albuterol (PROVENTIL) (2.5 MG/3ML) 0.083% nebulizer solution Take 3 mLs (2.5 mg total) by nebulization every 4 (four) hours as needed for wheezing or shortness of breath (coughing fits). 75 mL 1   albuterol (VENTOLIN HFA) 108 (90 Base) MCG/ACT inhaler 2 PUFF INTO THE LUNGS EVERY 4 HRS AS NEEDED FOR WHEEZE OR SHORTNESS OF BREATH. 8.5 each 1   AMBULATORY NON FORMULARY MEDICATION Apply 1 application topically at bedtime. Medication Name: Compounded Hydroquinone 8%, Tretinoin 0.025%, Kojic Acid 1%, Niacinamide 4%, Fluocinolone 0.025% Cream ([employer]) 30 g 0   azelastine (ASTELIN) 0.1 % nasal spray Place 2 sprays into both nostrils 2 (two) times daily as needed for rhinitis. 30 mL 5   baclofen (LIORESAL) 10 MG tablet Take 10 mg by mouth 3 (three) times daily.     Budeson-Glycopyrrol-Formoterol (BREZTRI AEROSPHERE) 160-9-4.8 MCG/ACT AERO Inhale 2 puffs into the lungs in the morning and at bedtime. 10.7 g 5   budesonide (PULMICORT) 0.5 MG/2ML nebulizer solution Take 2 mLs (0.5 mg total) by nebulization in the morning and at bedtime. For the next 2 weeks and use during asthma flares. 120 mL 2   cyclobenzaprine (FLEXERIL) 10 MG tablet Take 1 tablet (10 mg total) by mouth at bedtime as needed for muscle spasms. Can take at night AS NEEDED FOR muscle spasms 30 tablet 5   desonide (DESOWEN) 0.05 % ointment APPLY 1 APPLICATION TOPICALLY 2 TIMES DAILY AS NEEDED TO RED ITCHY AREAS TO THE FACE. 45 g 0   doxycycline (ADOXA) 100 MG tablet Take 1 tablet (100 mg total) by mouth 2 (two) times daily for 7 days. 14 tablet 0   DULoxetine (CYMBALTA) 60 MG capsule Take 1 capsule (60 mg total) by mouth daily. 90 capsule 1   Epinastine HCl 0.05 % ophthalmic solution Apply to  eye as needed.     EPINEPHrine (EPIPEN 2-PAK) 0.3 mg/0.3 mL IJ SOAJ injection Inject 0.3 mg into the muscle as needed for anaphylaxis. 1 each 1   famotidine (PEPCID) 20 MG tablet TAKE 1 TABLET BY MOUTH TWICE A DAY 180 tablet 0   flunisolide (NASALIDE) 25 MCG/ACT (0.025%) SOLN 2 SPRAYS PER NOSTRIL 2-3 TIMES PER DAY AS NEEDED FOR STUFFY NOSE. 75 mL 0   gabapentin (NEURONTIN) 300 MG capsule Take 1 capsule (300 mg total) by mouth 3 (three) times daily. X 1 week, then increase to 2 pills 2x/day and then if need be after 1 more week, can take 6 tabs/day- 180 capsule 5   hydrOXYzine (ATARAX/VISTARIL) 25 MG tablet Take 1 tablet (25 mg total) by mouth 3 (three) times daily as needed for itching. 60 tablet 0   meloxicam (MOBIC) 15 MG tablet Take 15 mg by mouth daily.  montelukast (SINGULAIR) 10 MG tablet TAKE 1 TABLET BY MOUTH EVERYDAY AT BEDTIME 90 tablet 1   ondansetron (ZOFRAN) 4 MG tablet Take 1 tablet (4 mg total) by mouth every 8 (eight) hours as needed. 20 tablet 0   oxyCODONE-acetaminophen (PERCOCET/ROXICET) 5-325 MG tablet Take 1 tablet by mouth every 8 (eight) hours as needed for severe pain. 20 tablet 0   pantoprazole (PROTONIX) 40 MG tablet TAKE 1 TABLET BY MOUTH EVERY DAY 90 tablet 2   phenazopyridine (PYRIDIUM) 100 MG tablet Take 1 tablet (100 mg total) by mouth 3 (three) times daily as needed for pain. 6 tablet 0   polyethylene glycol powder (GLYCOLAX/MIRALAX) 17 GM/SCOOP powder Take 17g daily 255 g 3   prazosin (MINIPRESS) 2 MG capsule Take 1 capsule (2 mg total) by mouth at bedtime. For PTSD Symptoms- and sleep 30 capsule 5   predniSONE (DELTASONE) 10 MG tablet Take 40mg  daily x 2 days, 30mg  daily x 2 days, 20mg  daily x 2 days and 10mg  daily x 2 days. 20 tablet 0   pregabalin (LYRICA) 150 MG capsule Take 1 capsule (150 mg total) by mouth at bedtime. X 4 days then 150 mg BID x 4 days then TID- for nerve pain/CRPS 90 capsule 5   TEZSPIRE 210 MG/1. syringe Inject into the skin.      tiZANidine (ZANAFLEX) 4 MG tablet Take 0.5-1 tablets (2-4 mg total) by mouth 2 (two) times daily. For muscle spasms- use scheduled 60 tablet 5   traZODone (DESYREL) 150 MG tablet Take 1-2 tablets (150-300 mg total) by mouth at bedtime. 60 tablet 5   triamcinolone ointment (KENALOG) 0.1 % Apply 1 Application topically 2 (two) times daily. 30 g 0   Carbinoxamine Maleate 4 MG TABS Take 1 tablet (4 mg total) by mouth in the morning, at noon, and at bedtime. 90 tablet 2   cyclobenzaprine (FLEXERIL) 5 MG tablet Take 5-10 mg by mouth at bedtime. (Patient not taking: Reported on 09/14/2022)     EPINEPHrine 0.3 mg/0.3 mL IJ SOAJ injection Use as directed as severe allergic reactions (Patient not taking: Reported on 09/14/2022) 2 each 1   Current Facility-Administered Medications  Medication Dose Route Frequency Provider Last Rate Last Admin   tezepelumab-ekko (TEZSPIRE) 210 MG/1. syringe 210 mg  210 mg Subcutaneous Q28 days , DO   210 mg at 09/08/22 09/16/2022   Allergies: Allergies  Allergen Reactions   Rocephin [Ceftriaxone Sodium In Dextrose] Hives   I reviewed her past medical history, social history, family history, and environmental history and no significant changes have been reported from her previous visit.  ROS: All others negative except as noted per HPI.   Objective: BP 134/84   Pulse (!) 126   Temp 98.1 F (36.7 C) (Temporal)   Resp 20   SpO2 99%  There is no height or weight on file to calculate BMI. General Appearance:  Alert, cooperative, appears tired, appears stated age  Head:  Normocephalic, without obvious abnormality, atraumatic  Eyes:  Conjunctiva clear, EOM's intact  Nose: Nares normal,  copious green rhinorrhea, hypertrophic turbinates, no visible anterior polyps, and septum midline  Throat: Lips, tongue normal; teeth and gums normal, normal posterior oropharynx  Neck: Supple, symmetrical  Lungs:   Tachypnea and clear to auscultation bilaterally, Respirations  unlabored, intermittent dry coughing  Heart:  Tachycardic and no murmur, Appears well perfused  Extremities: No edema  Skin: Skin color, texture, turgor normal, no rashes or lesions on visualized portions of skin  Neurologic: No gross deficits   Previous notes and tests were reviewed. The plan was reviewed with the patient/family, and all questions/concerned were addressed.  It was my pleasure to see Agda today and participate in her care. Please feel free to contact me with any questions or concerns.  Sincerely,  Roney Marion, MD  Allergy & Immunology  [facility] [place] [facility] Office: [phone]

## 2022-09-19 ENCOUNTER — Ambulatory Visit (HOSPITAL_BASED_OUTPATIENT_CLINIC_OR_DEPARTMENT_OTHER)
Admission: RE | Admit: 2022-09-19 | Discharge: 2022-09-19 | Disposition: A | Discharge status: Home / Self Care | Payer: Commercial Managed Care - HMO | Source: Ambulatory Visit | Attending: Internal Medicine | Admitting: Internal Medicine

## 2022-09-19 ENCOUNTER — Ambulatory Visit (INDEPENDENT_AMBULATORY_CARE_PROVIDER_SITE_OTHER): Payer: Commercial Managed Care - HMO | Admitting: Internal Medicine

## 2022-09-19 ENCOUNTER — Encounter: Payer: Self-pay | Admitting: Internal Medicine

## 2022-09-19 VITALS — BP 134/88 | HR 115 | Temp 97.9°F | Resp 20

## 2022-09-19 DIAGNOSIS — J4550 Severe persistent asthma, uncomplicated: Secondary | ICD-10-CM | POA: Diagnosis not present

## 2022-09-19 DIAGNOSIS — J3089 Other allergic rhinitis: Secondary | ICD-10-CM

## 2022-09-19 DIAGNOSIS — J4551 Severe persistent asthma with (acute) exacerbation: Secondary | ICD-10-CM | POA: Insufficient documentation

## 2022-09-19 DIAGNOSIS — K219 Gastro-esophageal reflux disease without esophagitis: Secondary | ICD-10-CM

## 2022-09-19 DIAGNOSIS — H1045 Other chronic allergic conjunctivitis: Secondary | ICD-10-CM | POA: Diagnosis not present

## 2022-09-19 DIAGNOSIS — J302 Other seasonal allergic rhinitis: Secondary | ICD-10-CM

## 2022-09-19 DIAGNOSIS — B999 Unspecified infectious disease: Secondary | ICD-10-CM

## 2022-09-19 MED ORDER — FLUTICASONE PROPIONATE HFA 220 MCG/ACT IN AERO: 1.0000 | INHALATION_SPRAY | Freq: Two times a day (BID) | RESPIRATORY_TRACT | 6 refills | Status: AC

## 2022-09-19 MED ORDER — PREDNISONE 10 MG PO TABS: ORAL_TABLET | ORAL | 0 refills | Status: AC

## 2022-09-19 MED ORDER — DUPILUMAB 300 MG/2ML ~~LOC~~ SOSY
300.0000 mg | PREFILLED_SYRINGE | Freq: Once | SUBCUTANEOUS | Status: AC
  Administered 2022-09-19: 300 mg via SUBCUTANEOUS

## 2022-09-19 NOTE — Progress Notes (Signed)
Immunotherapy   Patient Details  Name: Laura Fields MRN: [id number] Date of Birth: Sep 20, 1961  09/19/2022  [name]   Pt started on dupixent 09/19/22, 600mg  loading dose given, pt will be on 300 mg every 2 weeks.   patient instructions given.   09/19/2022, 5:25 PM

## 2022-09-19 NOTE — Progress Notes (Signed)
Pt started on dupixent 09/19/22, 600mg  loading dose given, pt will be on 300 mg every 2 weeks.

## 2022-09-19 NOTE — Progress Notes (Signed)
Chest xray was normal.  I think we can hold off on further antibiotics for now.  Continue the current plan for now and we will look at getting dupixent approved for you.  Can some one let patient know?

## 2022-09-19 NOTE — Patient Instructions (Addendum)
Asthma: not well controlled with acute exacerbation  Spirometry looked okay today,  Get CXR today   Take prednisone 40mg  daily x 2 days, 30mg  daily x 2 days, 20mg  daily x 2 days and 10mg  daily x 2 days.   Continue chronic management:  Get screening labs for bronchiectasis: Immunoglobulins, Strep pneumoniae titers Start Flovent 2 puffs twice daily in addition to 2 puffs twice a day with a spacer to prevent cough or wheeze Continue montelukast 10 mg once a day to prevent cough or wheeze Continue albuterol 2 puffs every 4 hours as needed for cough or wheeze OR Instead use albuterol 0.083% solution via nebulizer one unit vial every 4 hours as needed for cough or wheeze Will get give a sample of Dupixent 600mg   Medical Accomodations letter written today   Allergic rhinitis Increase Carbinoxamine 4mg  up to 4 times a day for drainage (this replaces xyzal) Continue  Benzoate pearls three times a day as needed for cough  Continue avoidance measures directed toward grass pollen and weed pollen as listed below Continue allergen immunotherapy per protocol and have access to an epinephrine autoinjector set  (hold off on injection until you fully recover from acute illness)  Continue azelastine 2 sprays in each nostril twice a day as needed for a runny nose Continue flunisolide- 2 sprays in each nostril 2 to 3 times a day.  Consider saline nasal rinses as needed for nasal symptoms. Use this before any medicated nasal sprays for best result  Allergic conjunctivitis Continue epinastine eye drops 1 drop in each eye twice a day as needed for red, itchy eyes.  Reflux Continue dietary lifestyle modifications as listed below Continue to follow up with your gastroenterologist for evaluation and treatment of GI issues  Follow up: we will contact you with blood work and chest xray results, follow up in 2 weeks   Thank you so much for letting me partake in your care today.   Don't hesitate to reach out if you have any additional concerns!  , MD  [facility] [place], [place]

## 2022-09-19 NOTE — Progress Notes (Signed)
Follow Up Note  RE: Laura Fields MRN: [id number] DOB: 07-Nov-1960 Date of Office Visit: 09/19/2022  Referring provider: Olive Bass,* Primary care provider: Olive Bass, FNP  Chief Complaint: Asthma (Pt is present for a same day sick visit for her asthma flare up.)  History of Present Illness: I had the pleasure of seeing Laura Fields for a follow up visit at the [facility] on 09/19/2022. She is a 61 y.o. female, who is being followed for severe persistent asthma, allergic rhinoconjunctivitis on AIT and tezspire . Her previous allergy office visit was on 09/14/22 with Dr. Marlynn Perking. Today is a  acute visit  .  At last visit she presented for AIT injection however was noted to be coughing, wheezing in the waiting room.  She was walked in for an acute appointment and treated with Depomedrol 40mg  IM, doxycycline and prednisone taper.  She walked in today complaining of persistent symptoms.    History obtained from patient, chart review.  Wheezing slightly improved but she still has persistent cough.  She reports only sleeping 6 hours since last visit due to symptoms  She finished prednisone and still has 2 days of doxycyline let.  Fells like doxycyline is not as affective as previously prescribed augmentin for exacerbation in September.  Using albuterol multiple times a day.  Rhinnorhea has improved with carbinoxamine, but she feels like it wear off throughout the day (using 3 times per day). She is due to teszpire on the 14th.  She has not tried other biologics.    She does have significant second hand exposure in her duplex appartment due to poor ventilation and neighbor who is a heavy smoker.  Housing authority can give a voucher to allow her for other accommodations if medically necessary.    Assessment and Plan: Fumi is a 61 y.o. female with: Severe persistent asthma with acute exacerbation - Plan: DG Chest 2 View, Spirometry with  Graph  Seasonal and perennial allergic rhinitis  Other chronic allergic conjunctivitis of both eyes  Gastroesophageal reflux disease without esophagitis  Recurrent infections - Plan: Immunoglobulins, QN, A/E/G/M, Strep pneumoniae 23 Serotypes IgG Plan: Patient Instructions  Asthma: not well controlled with acute exacerbation  Spirometry looked okay today,  Get CXR today   Take prednisone 40mg  daily x 2 days, 30mg  daily x 2 days, 20mg  daily x 2 days and 10mg  daily x 2 days.   Continue chronic management:  Get screening labs for bronchiectasis: Immunoglobulins, Strep pneumoniae titers Start Flovent 67 2 puffs twice daily in addition to 2 puffs twice a day with a spacer to prevent cough or wheeze Continue montelukast 10 mg once a day to prevent cough or wheeze Continue albuterol 2 puffs every 4 hours as needed for cough or wheeze OR Instead use albuterol 0.083% solution via nebulizer one unit vial every 4 hours as needed for cough or wheeze Will get give a sample of Dupixent 600mg   Medical Accomodations letter written today   Allergic rhinitis Increase Carbinoxamine 4mg  up to 4 times a day for drainage (this replaces xyzal) Continue  Benzoate pearls three times a day as needed for cough  Continue avoidance measures directed toward grass pollen and weed pollen as listed below Continue allergen immunotherapy per protocol and have access to an epinephrine autoinjector set  (hold off on injection until you fully recover from acute illness)  Continue azelastine 2 sprays in each nostril twice a day as needed for a  runny nose Continue flunisolide- 2 sprays in each nostril 2 to 3 times a day.  Consider saline nasal rinses as needed for nasal symptoms. Use this before any medicated nasal sprays for best result  Allergic conjunctivitis Continue epinastine eye drops 1 drop in each eye twice a day as needed for red, itchy eyes.  Reflux Continue dietary lifestyle  modifications as listed below Continue to follow up with your gastroenterologist for evaluation and treatment of GI issues  Follow up: we will contact you with blood work and chest xray results, follow up in 2 weeks   Thank you so much for letting me partake in your care today.  Don't hesitate to reach out if you have any additional concerns!  Ferol Luz, MD  [facility] [place] [place]  No follow-ups on file.  Meds ordered this encounter  Medications   dupilumab (DUPIXENT) prefilled syringe 300 mg   predniSONE (DELTASONE) 10 MG tablet    Sig: Take prednisone 40mg  by mouth daily x 2 days, 30mg  by mouth daily x 2 days, 20mg  by mouth daily x 2 days and 10mg  by mouth  daily x 2 days.    Dispense:  20 tablet    Refill:  0   fluticasone (FLOVENT HFA) 220 MCG/ACT inhaler    Sig: Inhale 1 puff into the lungs in the morning and at bedtime.    Dispense:  1 each    Refill:  6    Lab Orders         Immunoglobulins, QN, A/E/G/M         Strep pneumoniae 23 Serotypes IgG     Diagnostics: Spirometry:  Tracings reviewed. Her effort: Good reproducible efforts. FVC: 2.00L FEV1: 1.69L, 79% predicted FEV1/FVC ratio: 85% Interpretation: Spirometry consistent with normal pattern.  Please see scanned spirometry results for details.   Results interpreted by myself during this encounter and discussed with patient/family.   Medication List:  Current Outpatient Medications  Medication Sig Dispense Refill   albuterol (PROVENTIL) (2.5 MG/3ML) 0.083% nebulizer solution Take 3 mLs (2.5 mg total) by nebulization every 4 (four) hours as needed for wheezing or shortness of breath (coughing fits). 75 mL 1   albuterol (VENTOLIN HFA) 108 (90 Base) MCG/ACT inhaler 2 PUFF INTO THE LUNGS EVERY 4 HRS AS NEEDED FOR WHEEZE OR SHORTNESS OF BREATH. 8.5 each 1   AMBULATORY NON FORMULARY MEDICATION Apply 1 application topically at bedtime. Medication Name: Compounded Hydroquinone 8%, Tretinoin  0.025%, Kojic Acid 1%, Niacinamide 4%, Fluocinolone 0.025% Cream ([employer]) 30 g 0   azelastine (ASTELIN) 0.1 % nasal spray Place 2 sprays into both nostrils 2 (two) times daily as needed for rhinitis. 30 mL 5   baclofen (LIORESAL) 10 MG tablet Take 10 mg by mouth 3 (three) times daily.     Budeson-Glycopyrrol-Formoterol (BREZTRI AEROSPHERE) 160-9-4.8 MCG/ACT AERO Inhale 2 puffs into the lungs in the morning and at bedtime. 10.7 g 5   budesonide (PULMICORT) 0.5 MG/2ML nebulizer solution Take 2 mLs (0.5 mg total) by nebulization in the morning and at bedtime. For the next 2 weeks and use during asthma flares. 120 mL 2   Carbinoxamine Maleate 4 MG TABS Take 1 tablet (4 mg total) by mouth in the morning, at noon, and at bedtime. 90 tablet 2   cyclobenzaprine (FLEXERIL) 10 MG tablet Take 1 tablet (10 mg total) by mouth at bedtime as needed for muscle spasms. Can take at night AS NEEDED FOR muscle spasms 30 tablet 5  desonide (DESOWEN) 0.05 % ointment APPLY 1 APPLICATION TOPICALLY 2 TIMES DAILY AS NEEDED TO RED ITCHY AREAS TO THE FACE. 45 g 0   doxycycline (ADOXA) 100 MG tablet Take 1 tablet (100 mg total) by mouth 2 (two) times daily for 7 days. 14 tablet 0   DULoxetine (CYMBALTA) 60 MG capsule Take 1 capsule (60 mg total) by mouth daily. 90 capsule 1   Epinastine HCl 0.05 % ophthalmic solution Apply to eye as needed.     EPINEPHrine (EPIPEN 2-PAK) 0.3 mg/0.3 mL IJ SOAJ injection Inject 0.3 mg into the muscle as needed for anaphylaxis. 1 each 1   famotidine (PEPCID) 20 MG tablet TAKE 1 TABLET BY MOUTH TWICE A DAY 180 tablet 0   flunisolide (NASALIDE) 25 MCG/ACT (0.025%) SOLN 2 SPRAYS PER NOSTRIL 2-3 TIMES PER DAY AS NEEDED FOR STUFFY NOSE. 75 mL 0   fluticasone (FLOVENT HFA) 220 MCG/ACT inhaler Inhale 1 puff into the lungs in the morning and at bedtime. 1 each 6   gabapentin (NEURONTIN) 300 MG capsule Take 1 capsule (300 mg total) by mouth 3 (three) times daily. X 1 week, then increase to 2 pills  2x/day and then if need be after 1 more week, can take 6 tabs/day- 180 capsule 5   hydrOXYzine (ATARAX/VISTARIL) 25 MG tablet Take 1 tablet (25 mg total) by mouth 3 (three) times daily as needed for itching. 60 tablet 0   meloxicam (MOBIC) 15 MG tablet Take 15 mg by mouth daily.     montelukast (SINGULAIR) 10 MG tablet TAKE 1 TABLET BY MOUTH EVERYDAY AT BEDTIME 90 tablet 1   ondansetron (ZOFRAN) 4 MG tablet Take 1 tablet (4 mg total) by mouth every 8 (eight) hours as needed. 20 tablet 0   oxyCODONE-acetaminophen (PERCOCET/ROXICET) 5-325 MG tablet Take 1 tablet by mouth every 8 (eight) hours as needed for severe pain. 20 tablet 0   pantoprazole (PROTONIX) 40 MG tablet TAKE 1 TABLET BY MOUTH EVERY DAY 90 tablet 2   phenazopyridine (PYRIDIUM) 100 MG tablet Take 1 tablet (100 mg total) by mouth 3 (three) times daily as needed for pain. 6 tablet 0   polyethylene glycol powder (GLYCOLAX/MIRALAX) 17 GM/SCOOP powder Take 17g daily 255 g 3   prazosin (MINIPRESS) 2 MG capsule Take 1 capsule (2 mg total) by mouth at bedtime. For PTSD Symptoms- and sleep 30 capsule 5   predniSONE (DELTASONE) 10 MG tablet Take prednisone 40mg  by mouth daily x 2 days, 30mg  by mouth daily x 2 days, 20mg  by mouth daily x 2 days and 10mg  by mouth  daily x 2 days. 20 tablet 0   pregabalin (LYRICA) 150 MG capsule Take 1 capsule (150 mg total) by mouth at bedtime. X 4 days then 150 mg BID x 4 days then TID- for nerve pain/CRPS 90 capsule 5   TEZSPIRE 210 MG/1.91ML syringe Inject into the skin.     cyclobenzaprine (FLEXERIL) 5 MG tablet Take 5-10 mg by mouth at bedtime. (Patient not taking: Reported on 09/14/2022)     EPINEPHrine 0.3 mg/0.3 mL IJ SOAJ injection Use as directed as severe allergic reactions (Patient not taking: Reported on 09/14/2022) 2 each 1   predniSONE (DELTASONE) 10 MG tablet Take 40mg  daily x 2 days, 30mg  daily x 2 days, 20mg  daily x 2 days and 10mg  daily x 2 days. (Patient not taking: Reported on 09/19/2022) 20  tablet 0   tiZANidine (ZANAFLEX) 4 MG tablet Take 0.5-1 tablets (2-4 mg total) by mouth 2 (two) times  daily. For muscle spasms- use scheduled 60 tablet 5   traZODone (DESYREL) 150 MG tablet Take 1-2 tablets (150-300 mg total) by mouth at bedtime. 60 tablet 5   triamcinolone ointment (KENALOG) 0.1 % Apply 1 Application topically 2 (two) times daily. 30 g 0   Current Facility-Administered Medications  Medication Dose Route Frequency Provider Last Rate Last Admin   tezepelumab-ekko (TEZSPIRE) 210 MG/1. syringe 210 mg  210 mg Subcutaneous Q28 days Ellamae Sia, DO   210 mg at 09/08/22 8416   Allergies: Allergies  Allergen Reactions   Rocephin [Ceftriaxone Sodium In Dextrose] Hives   I reviewed her past medical history, social history, family history, and environmental history and no significant changes have been reported from her previous visit.  ROS: All others negative except as noted per HPI.   Objective: BP 134/88   Pulse (!) 115   Temp 97.9 F (36.6 C) (Temporal)   Resp 20   SpO2 99%  There is no height or weight on file to calculate BMI. General Appearance:  Alert, cooperative, very anxious  appears stated age  Head:  Normocephalic, without obvious abnormality, atraumatic  Eyes:  Conjunctiva clear, EOM's intact  Nose: Nares normal,  scant yellow rhinnorhea, hypertrophic turbinates, no visible anterior polyps, and septum midline  Throat: Lips, tongue normal; teeth and gums normal, + cobblestoning  Neck: Supple, symmetrical  Lungs:   clear to auscultation bilaterally, Respirations unlabored, intermittent dry coughing  Heart:  regular rate and rhythm and no murmur, Appears well perfused  Extremities: No edema  Skin: Skin color, texture, turgor normal, no rashes or lesions on visualized portions of skin   Neurologic: No gross deficits   Previous notes and tests were reviewed. The plan was reviewed with the patient/family, and all questions/concerned were addressed.  It was  my pleasure to see Piccola today and participate in her care. Please feel free to contact me with any questions or concerns.  Sincerely,  Ferol Luz, MD  Allergy & Immunology  [facility] Office: [phone]

## 2022-09-20 ENCOUNTER — Telehealth: Payer: Self-pay | Admitting: *Deleted

## 2022-09-20 NOTE — Telephone Encounter (Signed)
Thanks

## 2022-09-20 NOTE — Addendum Note (Signed)
Addended by: Devoria Glassing on: 09/20/2022 11:31 AM   Modules accepted: Orders

## 2022-09-20 NOTE — Telephone Encounter (Signed)
Called patient and advised approval, copay card and submit to [facility] for Dupixent to replace Tezspire. Patient advised she is familiar with giving injs and will self admin same. Instructed patient on delivery, storage and dosing for same.

## 2022-09-21 ENCOUNTER — Other Ambulatory Visit: Payer: Self-pay | Admitting: Physical Medicine and Rehabilitation

## 2022-09-25 ENCOUNTER — Other Ambulatory Visit: Payer: Self-pay | Admitting: Internal Medicine

## 2022-09-28 ENCOUNTER — Telehealth: Payer: Self-pay

## 2022-09-28 DIAGNOSIS — B999 Unspecified infectious disease: Secondary | ICD-10-CM

## 2022-09-28 LAB — STREP PNEUMONIAE 23 SEROTYPES IGG
Pneumo Ab Type 1*: 0.1 ug/mL — ABNORMAL LOW (ref >1.3)
Pneumo Ab Type 12 (12F)*: 0.1 ug/mL — ABNORMAL LOW (ref >1.3)
Pneumo Ab Type 14*: <0.1 ug/mL — ABNORMAL LOW (ref >1.3)
Pneumo Ab Type 17 (17F)*: 0.6 ug/mL — ABNORMAL LOW (ref >1.3)
Pneumo Ab Type 19 (19F)*: 3.4 ug/mL (ref >1.3)
Pneumo Ab Type 2*: 0.2 ug/mL — ABNORMAL LOW (ref >1.3)
Pneumo Ab Type 20*: 0.4 ug/mL — ABNORMAL LOW (ref >1.3)
Pneumo Ab Type 22 (22F)*: 0.2 ug/mL — ABNORMAL LOW (ref >1.3)
Pneumo Ab Type 23 (23F)*: 0.2 ug/mL — ABNORMAL LOW (ref >1.3)
Pneumo Ab Type 26 (6B)*: 0.2 ug/mL — ABNORMAL LOW (ref >1.3)
Pneumo Ab Type 3*: 0.1 ug/mL — ABNORMAL LOW (ref >1.3)
Pneumo Ab Type 34 (10A)*: 0.5 ug/mL — ABNORMAL LOW (ref >1.3)
Pneumo Ab Type 4*: <0.1 ug/mL — ABNORMAL LOW (ref >1.3)
Pneumo Ab Type 43 (11A)*: 0.1 ug/mL — ABNORMAL LOW (ref >1.3)
Pneumo Ab Type 5*: 0.1 ug/mL — ABNORMAL LOW (ref >1.3)
Pneumo Ab Type 51 (7F)*: <0.1 ug/mL — ABNORMAL LOW (ref >1.3)
Pneumo Ab Type 54 (15B)*: 0.1 ug/mL — ABNORMAL LOW (ref >1.3)
Pneumo Ab Type 56 (18C)*: 0.2 ug/mL — ABNORMAL LOW (ref >1.3)
Pneumo Ab Type 57 (19A)*: 1.3 ug/mL — ABNORMAL LOW (ref >1.3)
Pneumo Ab Type 68 (9V)*: <0.1 ug/mL — ABNORMAL LOW (ref >1.3)
Pneumo Ab Type 70 (33F)*: 0.2 ug/mL — ABNORMAL LOW (ref >1.3)
Pneumo Ab Type 8*: 0.5 ug/mL — ABNORMAL LOW (ref >1.3)
Pneumo Ab Type 9 (9N)*: 0.1 ug/mL — ABNORMAL LOW (ref >1.3)

## 2022-09-28 LAB — IMMUNOGLOBULINS A/E/G/M, SERUM
IgA/Immunoglobulin A, Serum: 192 mg/dL (ref 87–352)
IgE (Immunoglobulin E), Serum: 58 IU/mL (ref 6–495)
IgG (Immunoglobin G), Serum: 1055 mg/dL (ref 586–1602)
IgM (Immunoglobulin M), Srm: 95 mg/dL (ref 26–217)

## 2022-09-28 NOTE — Progress Notes (Signed)
Vaccine titers to strep pneumoniae were very low.  She needs to receive the Pneumovax (PSV 23) and repeat titers in 6 weeks. If she doesn't have an appropriate response to repeat vaccination this could explain recurrent infections.   Total immunoglobulin levels were normal.  This is reassuring for not a more severe immunodeficiency.

## 2022-09-28 NOTE — Telephone Encounter (Signed)
Patient is needing a pneumovax 23 vaccine and blood draw 6 weeks later after receiving the pneumovax 23. Refer to result management under lab results. Ordering a future lab results and mailed to patient's home.

## 2022-09-28 NOTE — Telephone Encounter (Signed)
Informed pt of this and she will go get vaccine and let us know once she gets it done

## 2022-09-29 ENCOUNTER — Encounter: Payer: Self-pay | Admitting: Physical Medicine and Rehabilitation

## 2022-09-29 NOTE — Telephone Encounter (Signed)
Patient calling to let us know that she is presently staying in [place] address is [phone] [place] [address]. I did call [facility] in [place] they do carry the pneumovax 23 [facility] phone number is [phone]. Patient is aware and will go get the vaccine. Will mail the lab order to the above address. Patient is aware to get her blood drawn 6 weeks later after receiving the pneumovax 23.

## 2022-09-30 ENCOUNTER — Other Ambulatory Visit: Payer: Self-pay | Admitting: Family Medicine

## 2022-09-30 ENCOUNTER — Telehealth: Payer: Self-pay

## 2022-09-30 ENCOUNTER — Encounter: Payer: Self-pay | Admitting: Physical Medicine and Rehabilitation

## 2022-09-30 NOTE — Telephone Encounter (Signed)
I spoke to Mrs. Napoleon and told her to take 1200mg  gabapentin at bedtime and see how things go Fri-Sun.  If she stil has pain and isn't sleeping, she can increase prazosin to 4mg . If she still is having problems by Thursday, she can call back. If she stays on the increased dosing, she'll need new rx's as well.    Also, the patient was very upset because she was told this morning that Dr. was out and there was nobody who could address her issues until she returned. Do we have any idea who talked to the patient this morning? I would like to hear that person's side of the story. If someone told the patient this, it's unacceptable

## 2022-09-30 NOTE — Telephone Encounter (Signed)
Patient called in [place] and upset because she was advised that Dr.Lovorn is out on medical leave and her current medication is not working, she said it is not fair that she had to be out and she leaves her patients up in the open to suffer and she hung up.

## 2022-10-03 ENCOUNTER — Ambulatory Visit: Payer: Commercial Managed Care - HMO | Admitting: Physical Medicine and Rehabilitation

## 2022-10-06 ENCOUNTER — Ambulatory Visit: Payer: Commercial Managed Care - HMO

## 2022-10-11 ENCOUNTER — Telehealth: Payer: Self-pay

## 2022-10-11 MED ORDER — PRAZOSIN HCL 2 MG PO CAPS: 2.0000 mg | ORAL_CAPSULE | Freq: Three times a day (TID) | ORAL | 5 refills | Status: AC

## 2022-10-11 MED ORDER — GABAPENTIN 600 MG PO TABS: 600.0000 mg | ORAL_TABLET | Freq: Four times a day (QID) | ORAL | 3 refills | Status: AC

## 2022-10-11 NOTE — Telephone Encounter (Signed)
Pt told me the increase in prazosin gave her another hour of sleep without nightmares. No dizziness or lightheadedness. Tolerating gabapentin also  Plan: Increase prazosin to 6mg  at bedtime RF gabapentin 600mg  bid and 1200mg  qhs  Asked her to watch out for light-headedness/dizziness, sedation.

## 2022-10-11 NOTE — Telephone Encounter (Signed)
Patient called in stating the increase in gabapentin is not working for her at this time and she is still in the same amount of pain and would like a return call at [phone]

## 2022-10-27 ENCOUNTER — Telehealth: Payer: Commercial Managed Care - HMO

## 2022-10-27 MED ORDER — CYCLOBENZAPRINE HCL 10 MG PO TABS: 10.0000 mg | ORAL_TABLET | Freq: Every evening | ORAL | 5 refills | Status: AC | PRN

## 2022-10-27 NOTE — Telephone Encounter (Signed)
Patient requesting refill for Cyclobenzaprine 10mg  tab

## 2022-11-11 ENCOUNTER — Other Ambulatory Visit: Payer: Self-pay

## 2022-11-30 ENCOUNTER — Encounter: Payer: Medicaid Other | Admitting: Physical Medicine and Rehabilitation

## 2022-12-19 ENCOUNTER — Other Ambulatory Visit: Payer: Self-pay | Admitting: Internal Medicine

## 2023-02-02 ENCOUNTER — Other Ambulatory Visit: Payer: Self-pay | Admitting: Allergy

## 2023-02-03 ENCOUNTER — Other Ambulatory Visit: Payer: Self-pay | Admitting: Internal Medicine

## 2023-02-03 NOTE — Telephone Encounter (Signed)
Is it okay to change from olopatadine 0.6% to olopatadine 665mg ?

## 2023-02-15 ENCOUNTER — Other Ambulatory Visit: Payer: Self-pay | Admitting: Family Medicine

## 2023-03-29 ENCOUNTER — Other Ambulatory Visit: Payer: Self-pay | Admitting: Family Medicine

## 2023-08-11 ENCOUNTER — Telehealth: Payer: Self-pay | Admitting: Internal Medicine

## 2023-08-11 NOTE — Telephone Encounter (Signed)
Patient informed me she is no longer needing [place] to order her Dupixent as she has moved to [place] and is seeing another provider.
